# Patient Record
Sex: Male | Born: 1960 | Race: White | Hispanic: No | Smoking: Never smoker
Health system: Southern US, Community
[De-identification: ages and names within clinical notes are randomized; demographics above are authoritative.]

## PROBLEM LIST (undated history)

## (undated) DIAGNOSIS — E119 Type 2 diabetes mellitus without complications: Secondary | ICD-10-CM

## (undated) DIAGNOSIS — I10 Essential (primary) hypertension: Secondary | ICD-10-CM

## (undated) DIAGNOSIS — T675XXA Heat exhaustion, unspecified, initial encounter: Secondary | ICD-10-CM

## (undated) DIAGNOSIS — E785 Hyperlipidemia, unspecified: Secondary | ICD-10-CM

## (undated) DIAGNOSIS — C61 Malignant neoplasm of prostate: Secondary | ICD-10-CM

## (undated) HISTORY — DX: Essential (primary) hypertension: I10

## (undated) HISTORY — DX: Malignant neoplasm of prostate: C61

## (undated) HISTORY — DX: Type 2 diabetes mellitus without complications: E11.9

## (undated) HISTORY — PX: PROSTATE SURGERY: SHX751

## (undated) HISTORY — DX: Hyperlipidemia, unspecified: E78.5

## (undated) HISTORY — DX: Heat exhaustion, unspecified, initial encounter: T67.5XXA

## (undated) HISTORY — PX: BACK SURGERY: SHX140

---

## 2003-06-23 DIAGNOSIS — C61 Malignant neoplasm of prostate: Secondary | ICD-10-CM

## 2003-06-23 HISTORY — PX: PROSTATECTOMY: SHX69

## 2003-06-23 HISTORY — DX: Malignant neoplasm of prostate: C61

## 2005-02-05 ENCOUNTER — Other Ambulatory Visit: Admission: RE | Admit: 2005-02-05 | Discharge: 2005-02-05 | Payer: Self-pay | Admitting: Urology

## 2005-03-10 ENCOUNTER — Encounter (INDEPENDENT_AMBULATORY_CARE_PROVIDER_SITE_OTHER): Payer: Self-pay | Admitting: Urology

## 2005-03-10 ENCOUNTER — Inpatient Hospital Stay (HOSPITAL_COMMUNITY): Admission: RE | Admit: 2005-03-10 | Discharge: 2005-03-17 | Payer: Self-pay | Admitting: Urology

## 2005-03-23 ENCOUNTER — Inpatient Hospital Stay (HOSPITAL_COMMUNITY): Admission: AD | Admit: 2005-03-23 | Discharge: 2005-03-25 | Payer: Self-pay | Admitting: Urology

## 2005-05-07 ENCOUNTER — Encounter: Admission: RE | Admit: 2005-05-07 | Discharge: 2005-05-07 | Payer: Self-pay | Admitting: Occupational Medicine

## 2005-05-08 ENCOUNTER — Ambulatory Visit (HOSPITAL_COMMUNITY): Admission: RE | Admit: 2005-05-08 | Discharge: 2005-05-08 | Payer: Self-pay | Admitting: Urology

## 2005-05-09 ENCOUNTER — Encounter: Admission: RE | Admit: 2005-05-09 | Discharge: 2005-05-09 | Payer: Self-pay | Admitting: Occupational Medicine

## 2005-07-03 ENCOUNTER — Ambulatory Visit (HOSPITAL_COMMUNITY): Admission: RE | Admit: 2005-07-03 | Discharge: 2005-07-03 | Payer: Self-pay | Admitting: Urology

## 2005-08-13 ENCOUNTER — Ambulatory Visit (HOSPITAL_COMMUNITY): Admission: RE | Admit: 2005-08-13 | Discharge: 2005-08-13 | Payer: Self-pay | Admitting: Urology

## 2005-08-21 ENCOUNTER — Ambulatory Visit (HOSPITAL_COMMUNITY): Admission: RE | Admit: 2005-08-21 | Discharge: 2005-08-21 | Payer: Self-pay | Admitting: Urology

## 2005-08-21 ENCOUNTER — Encounter (INDEPENDENT_AMBULATORY_CARE_PROVIDER_SITE_OTHER): Payer: Self-pay | Admitting: Urology

## 2005-12-15 ENCOUNTER — Encounter (INDEPENDENT_AMBULATORY_CARE_PROVIDER_SITE_OTHER): Payer: Self-pay | Admitting: *Deleted

## 2005-12-15 ENCOUNTER — Ambulatory Visit (HOSPITAL_COMMUNITY): Admission: RE | Admit: 2005-12-15 | Discharge: 2005-12-15 | Payer: Self-pay | Admitting: Urology

## 2009-10-16 ENCOUNTER — Emergency Department (HOSPITAL_COMMUNITY): Admission: EM | Admit: 2009-10-16 | Discharge: 2009-10-16 | Payer: Self-pay | Admitting: Emergency Medicine

## 2010-07-07 ENCOUNTER — Encounter: Payer: Self-pay | Admitting: Gastroenterology

## 2010-07-16 ENCOUNTER — Encounter (INDEPENDENT_AMBULATORY_CARE_PROVIDER_SITE_OTHER): Payer: Self-pay | Admitting: *Deleted

## 2010-07-18 ENCOUNTER — Ambulatory Visit
Admission: RE | Admit: 2010-07-18 | Discharge: 2010-07-18 | Payer: Self-pay | Source: Home / Self Care | Attending: Gastroenterology | Admitting: Gastroenterology

## 2010-07-24 NOTE — Miscellaneous (Signed)
Summary: previsit prep/rm  Clinical Lists Changes  Medications: Added new medication of MOVIPREP 100 GM  SOLR (PEG-KCL-NACL-NASULF-NA ASC-C) As per prep instructions. - Signed Rx of MOVIPREP 100 GM  SOLR (PEG-KCL-NACL-NASULF-NA ASC-C) As per prep instructions.;  #1 x 0;  Signed;  Entered by: Sherren Kerns RN;  Authorized by: Louis Meckel MD;  Method used: Electronically to [facility] [phone]*, [address], [place], [place], [place]  [address], Ph: [phone] or [phone], Fax: [phone] Allergies: Added new allergy or adverse reaction of * BLOOD PRESSURE PILL Observations: Added new observation of ALLERGY REV: Done (07/18/2010 13:00) Added new observation of NKA: F (07/18/2010 13:00)    Prescriptions: MOVIPREP 100 GM  SOLR (PEG-KCL-NACL-NASULF-NA ASC-C) As per prep instructions.  #1 x 0   Entered by:   Sherren Kerns RN   Authorized by:   Louis Meckel MD   Signed by:   Sherren Kerns RN on 07/18/2010   Method used:   Electronically to        [facility] [phone]* (retail)       [address]       [place], [place]  [address]       Ph: [phone] or [phone]       Fax: [phone]   RxID:   [id number]

## 2010-07-24 NOTE — Letter (Signed)
Summary: Pre Visit Letter Revised  [facility]  [address] [place], [place] [address]   Phone: [phone]  Fax: [phone]        07/07/2010 MRN: [id number]  [facility] [place] [address] [place], [place]  [address]             Procedure Date:  07-31-10 10:30am           Direct Colon - Dr Nedra Hai to the Gastroenterology Division at [facility].    You are scheduled to see a nurse for your pre-procedure visit on 07-18-10 at 1:30pm on the 3rd floor at [facility], [address]  We ask that you try to arrive at our office 15 minutes prior to your appointment time to allow for check-in.  Please take a minute to review the attached form.  If you answer "Yes" to one or more of the questions on the first page, we ask that you call the person listed at your earliest opportunity.  If you answer "No" to all of the questions, please complete the rest of the form and bring it to your appointment.    Your nurse visit will consist of discussing your medical and surgical history, your immediate family medical history, and your medications.   If you are unable to list all of your medications on the form, please bring the medication bottles to your appointment and we will list them.  We will need to be aware of both prescribed and over the counter drugs.  We will need to know exact dosage information as well.    Please be prepared to read and sign documents such as consent forms, a financial agreement, and acknowledgement forms.  If necessary, and with your consent, a friend or relative is welcome to sit-in on the nurse visit with you.  Please bring your insurance card so that we may make a copy of it.  If your insurance requires a referral to see a specialist, please bring your referral form from your primary care physician.  No co-pay is required for this nurse visit.     If you cannot keep your appointment, please call [phone] to cancel or reschedule prior to  your appointment date.  This allows [place] the opportunity to schedule an appointment for another patient in need of care.    Thank you for choosing [facility] for your medical needs.  We appreciate the opportunity to care for you.  Please visit [place] at our website  to learn more about our practice.  Sincerely, The Gastroenterology Division

## 2010-07-24 NOTE — Letter (Signed)
Summary: [facility] Instructions  [facility]  [address] [place], [place] [address]   Phone: [phone]  Fax: [phone]       Anthony Austin    04-Jul-1960    MRN: [id number]        Procedure Day /Date: Thursday 07/31/10     Arrival Time: 9:30 am      Procedure Time: 10:30 am     Location of Procedure:                    _X_  [facility] (4th Floor)   PREPARATION FOR COLONOSCOPY WITH MOVIPREP   Starting 5 days prior to your procedure _Saturday 07/26/10 _ do not eat nuts, seeds, popcorn, corn, beans, peas,  salads, or any raw vegetables.  Do not take any fiber supplements (e.g. Metamucil, Citrucel, and Benefiber).  THE DAY BEFORE YOUR PROCEDURE         DATE:  07/30/10    DAY: Wednesday    1.  Drink clear liquids the entire day-NO SOLID FOOD  2.  Do not drink anything colored red or purple.  Avoid juices with pulp.  No orange juice.  3.  Drink at least 64 oz. (8 glasses) of fluid/clear liquids during the day to prevent dehydration and help the prep work efficiently.  CLEAR LIQUIDS INCLUDE: Water Jello Ice Popsicles Tea (sugar ok, no milk/cream) Powdered fruit flavored drinks Coffee (sugar ok, no milk/cream) Gatorade Juice: apple, white grape, white cranberry  Lemonade Clear bullion, consomm, broth Carbonated beverages (any kind) Strained chicken noodle soup Hard Candy                             4.  In the morning, mix first dose of MoviPrep solution:    Empty 1 Pouch A and 1 Pouch B into the disposable container    Add lukewarm drinking water to the top line of the container. Mix to dissolve    Refrigerate (mixed solution should be used within 24 hrs)  5.  Begin drinking the prep at 5:00 p.m. The MoviPrep container is divided by 4 marks.   Every 15 minutes drink the solution down to the next mark (approximately 8 oz) until the full liter is complete.   6.  Follow completed prep with 16 oz of clear liquid of your choice (Nothing red or  purple).  Continue to drink clear liquids until bedtime.  7.  Before going to bed, mix second dose of MoviPrep solution:    Empty 1 Pouch A and 1 Pouch B into the disposable container    Add lukewarm drinking water to the top line of the container. Mix to dissolve    Refrigerate  THE DAY OF YOUR PROCEDURE      DATE: 07/31/10   DAY: Thursday   Beginning at 5:30 a.m. (5 hours before procedure):         1. Every 15 minutes, drink the solution down to the next mark (approx 8 oz) until the full liter is complete.  2. Follow completed prep with 16 oz. of clear liquid of your choice.    3. You may drink clear liquids until 8:30 am (2 HOURS BEFORE PROCEDURE).   MEDICATION INSTRUCTIONS  Unless otherwise instructed, you should take regular prescription medications with a small sip of water   as early as possible the morning of your procedure.   Additional medication instructions: Hold HCTZ pill morning  of procedure only! Should take Diltiazem BP  Pill morning of procedure!         OTHER INSTRUCTIONS  You will need a responsible adult at least 50 years of age to accompany you and drive you home.   This person must remain in the waiting room during your procedure.  Wear loose fitting clothing that is easily removed.  Leave jewelry and other valuables at home.  However, you may wish to bring a book to read or  an iPod/MP3 player to listen to music as you wait for your procedure to start.  Remove all body piercing jewelry and leave at home.  Total time from sign-in until discharge is approximately 2-3 hours.  You should go home directly after your procedure and rest.  You can resume normal activities the  day after your procedure.  The day of your procedure you should not:   Drive   Make legal decisions   Operate machinery   Drink alcohol   Return to work  You will receive specific instructions about eating, activities and medications before you leave.    The above  instructions have been reviewed and explained to me by   Sherren Kerns RN  July 18, 2010 2:01 PM    I fully understand and can verbalize these instructions _____________________________ Date _________

## 2010-07-31 ENCOUNTER — Encounter: Payer: Self-pay | Admitting: Gastroenterology

## 2010-07-31 ENCOUNTER — Other Ambulatory Visit (AMBULATORY_SURGERY_CENTER): Payer: Managed Care, Other (non HMO) | Admitting: Gastroenterology

## 2010-07-31 DIAGNOSIS — K5730 Diverticulosis of large intestine without perforation or abscess without bleeding: Secondary | ICD-10-CM

## 2010-07-31 DIAGNOSIS — Z1211 Encounter for screening for malignant neoplasm of colon: Secondary | ICD-10-CM

## 2010-07-31 HISTORY — PX: COLONOSCOPY: SHX174

## 2010-08-07 NOTE — Procedures (Signed)
Summary: Colonoscopy   Colonoscopy  Procedure date:  07/31/2010  Findings:      Location:  [facility].    Procedures Next Due Date:    Colonoscopy: 07/2020 COLONOSCOPY PROCEDURE REPORT  PATIENT:  Anthony Austin, Anthony Austin  MR#:  [id number] BIRTHDATE:   11-26-1960, 50 yrs. old   GENDER:   male  ENDOSCOPIST:   Barbette Hair. Arlyce Dice, MD Referred by: Rudi Heap, M.D.  PROCEDURE DATE:  07/31/2010 PROCEDURE:  Diagnostic Colonoscopy ASA CLASS:   Class II INDICATIONS: 1) Routine Risk Screening   MEDICATIONS:    Fentanyl 50 mcg IV, Versed 7 mg IV  DESCRIPTION OF PROCEDURE:   After the risks benefits and alternatives of the procedure were thoroughly explained, informed consent was obtained.  Digital rectal exam was performed and revealed no abnormalities.   The LB 180AL [id number] endoscope was introduced through the anus and advanced to the cecum, which was identified by both the appendix and ileocecal valve, without limitations.  The quality of the prep was excellent, using MoviPrep.  The instrument was then slowly withdrawn as the colon was fully examined. <<PROCEDUREIMAGES>>                  <<OLD IMAGES>>  FINDINGS:  Mild diverticulosis was found in the sigmoid colon (see image17).  This was otherwise a normal examination of the colon (see image2, image4, image5, image8, image10, image15, image16, image19, and image20).   Retroflexed views in the rectum revealed no abnormalities.    The time to cecum =  2.25  minutes. The scope was then withdrawn (time =  6.0  min) from the patient and the procedure completed.  COMPLICATIONS:   None  ENDOSCOPIC IMPRESSION:  1) Mild diverticulosis in the sigmoid colon  2) Otherwise normal examination RECOMMENDATIONS:  1) Continue current colorectal screening recommendations for "routine risk" patients with a repeat colonoscopy in 10 years.  REPEAT EXAM:    10 year(s) Colonoscopy   _______________________________ Barbette Hair. Arlyce Dice,  MD    CC:

## 2010-09-09 LAB — BASIC METABOLIC PANEL
BUN: 15 mg/dL (ref 6–23)
CO2: 28 mEq/L (ref 19–32)
Calcium: 9.2 mg/dL (ref 8.4–10.5)
Chloride: 101 mEq/L (ref 96–112)
Creatinine, Ser: 1.01 mg/dL (ref 0.4–1.5)
GFR calc Af Amer: >60 mL/min (ref >60)
GFR calc non Af Amer: >60 mL/min (ref >60)
Glucose, Bld: 124 mg/dL — ABNORMAL HIGH (ref 70–99)
Potassium: 3.4 mEq/L — ABNORMAL LOW (ref 3.5–5.1)
Sodium: 137 mEq/L (ref 135–145)

## 2010-09-09 LAB — CBC
HCT: 45.6 % (ref 39.0–52.0)
Hemoglobin: 15.8 g/dL (ref 13.0–17.0)
MCHC: 34.7 g/dL (ref 30.0–36.0)
MCV: 94.4 fL (ref 78.0–100.0)
Platelets: 216 10*3/uL (ref 150–400)
RBC: 4.84 MIL/uL (ref 4.22–5.81)
RDW: 12.9 % (ref 11.5–15.5)
WBC: 6.5 10*3/uL (ref 4.0–10.5)

## 2010-09-09 LAB — POCT CARDIAC MARKERS
CKMB, poc: 1.9 ng/mL (ref 1.0–8.0)
Myoglobin, poc: 54.7 ng/mL (ref 12–200)
Troponin i, poc: <0.05 ng/mL (ref 0.00–0.09)

## 2010-09-09 LAB — DIFFERENTIAL
Basophils Absolute: 0 10*3/uL (ref 0.0–0.1)
Basophils Relative: 0 % (ref 0–1)
Eosinophils Absolute: 0.1 10*3/uL (ref 0.0–0.7)
Eosinophils Relative: 2 % (ref 0–5)
Lymphocytes Relative: 31 % (ref 12–46)
Lymphs Abs: 2 10*3/uL (ref 0.7–4.0)
Monocytes Absolute: 0.4 10*3/uL (ref 0.1–1.0)
Monocytes Relative: 7 % (ref 3–12)
Neutro Abs: 3.9 10*3/uL (ref 1.7–7.7)
Neutrophils Relative %: 61 % (ref 43–77)

## 2010-11-07 NOTE — H&P (Signed)
NAME:  Anthony Austin, Anthony Austin                  ACCOUNT NO.:  [id number]   MEDICAL RECORD NO.:  [id number]           PATIENT TYPE:  AMB   LOCATION:                                FACILITY:  [facility]   PHYSICIAN:  Ky Barban, M.D.DATE OF BIRTH:  1960/08/30   DATE OF ADMISSION:  DATE OF DISCHARGE:  LH                                HISTORY & PHYSICAL   CHIEF COMPLAINT:  Bladder neck stenosis.  A 50 year old gentleman had  radical retropubic prostatectomy done September last year and his PSA is  less than 0.1 but he has developed difficulty voiding and a few months ago  he had a bladder neck biopsy and dilation done.  There is no recurrence of  the cancer and he did well after dilating the bladder neck.  Now he has  started to have the same problem again.  He needs to have dilation which he  requests under anesthesia.  He will come as outpatient and we will do  bladder neck dilation under anesthesia as outpatient.   PAST MEDICAL HISTORY:  As mentioned above.  Otherwise, he is in good general  medical condition.   FAMILY HISTORY:  Father has prostate cancer.   PERSONAL HISTORY:  Does not smoke or drink.   REVIEW OF SYSTEMS:  Unremarkable.   PHYSICAL EXAMINATION:  VITAL SIGNS:  Blood pressure 136/84, temperature  97.3.  GENERAL:  Fully conscious, alert, oriented, not in any acute distress.  CENTRAL NERVOUS SYSTEM:  Negative.  HEENT:  Negative.  CHEST:  Symmetrical.  HEART:  Regular sinus rhythm.  ABDOMEN:  Soft, flat.  Liver, spleen, kidneys are not palpable.  No CVA  tenderness.  GENITOURINARY:  External genitalia is unremarkable.  RECTAL:  Deferred.  EXTREMITIES:  Normal.   IMPRESSION:  Bladder neck stenosis, carcinoma prostate.   PLAN:  Cysto dilation under anesthesia as outpatient.      Ky Barban, M.D.  Electronically Signed    MIJ/MEDQ  D:  12/14/2005  T:  12/14/2005  Job:  [id number]

## 2010-11-07 NOTE — H&P (Signed)
NAMEHEATHER, STREEPER                  ACCOUNT NO.:  [id number]   MEDICAL RECORD NO.:  [id number]          PATIENT TYPE:  INP   LOCATION:  A312                          FACILITY:  [facility]   PHYSICIAN:  Ky Barban, M.D.DATE OF BIRTH:  08/06/1960   DATE OF ADMISSION:  03/23/2005  DATE OF DISCHARGE:  LH                                HISTORY & PHYSICAL   CHIEF COMPLAINT:  Foley catheter stuck.   HISTORY:  A 50 year old gentleman two weeks ago had a radical retropubic  prostatectomy for carcinoma of the prostate. Last week, he was sent home.  Today, he comes in complaining of ______________ leakage around the Foley  catheter. He is about two weeks postoperative. Instead of waiting three  weeks, I decided to take out the Foley catheter. The balloon was deflated,  but the Foley catheter is not coming out. It may be it is stuck inside with  a stitch or something, and he is a lot discomfort, so I decided to admit  him, and I will do cystoscopy tomorrow under anesthesia to see if we can get  the catheter out.   PAST MEDICAL HISTORY:  Refer to his old record.   FAMILY HISTORY:  Refer to his old record.   REVIEW OF SYSTEMS:  No other significant medical problems.   PHYSICAL EXAMINATION:  VITAL SIGNS:  Blood pressure 130/80, temperature is  normal.  CENTRAL NERVOUS SYSTEM:  No gross neurological deficit.  HEENT:  Negative.  CHEST:  Symmetrical.  HEART:  Regular sinus rhythm.  ABDOMEN:  Soft, flat. Liver, spleen, kidneys not palpable. No CVA  tenderness. Abdomen is soft.  GENITOURINARY:  External genitalia:  Has Foley catheter in place. Complains  of suprapubic discomfort. Testicles are normal.  RECTAL:  Deferred.   IMPRESSION:  Status post radical retropubic prostatectomy. Foley catheter  stuck.   PLAN:  Cysto, removal of Foley under anesthesia.      Ky Barban, M.D.  Electronically Signed    MIJ/MEDQ  D:  03/23/2005  T:  03/23/2005  Job:  [id number]

## 2010-11-07 NOTE — Op Note (Signed)
NAME:  Anthony Austin, Anthony Austin                  ACCOUNT NO.:  [id number]   MEDICAL RECORD NO.:  [id number]          PATIENT TYPE:  AMB   LOCATION:  DAY                           FACILITY:  [facility]   PHYSICIAN:  Ky Barban, M.D.DATE OF BIRTH:  Nov 19, 1960   DATE OF PROCEDURE:  12/15/2005  DATE OF DISCHARGE:                                 OPERATIVE REPORT   PREOPERATIVE DIAGNOSES:  1.  Bladder neck stenosis, post radical retropubic prostatectomy.  2.  Carcinoma of prostate.   POSTOPERATIVE DIAGNOSES:  1.  Bladder neck stenosis, post radical retropubic prostatectomy.  2.  Carcinoma of prostate.   PROCEDURE:  Cystoscopy, dilation of bladder neck, laser ablation of bladder  neck, bladder neck biopsy.   ANESTHESIA:  General endotracheal.   PROCEDURE:  The patient was given general endotracheal anesthesia and placed  in the lithotomy position.  Usual prep and drape.  A #17 cystoscope was  introduced under direct vision, and the anterior urethra looks normal.  The  bladder neck is very much stenosed.  I was able to get a #4 whistle-tip  catheter through it.  Once I got the catheter through it, then I introduced  a 550 laser fiber and opened up the bladder neck with the Holmium laser.  Then, using a side-firing fiber, the bladder neck was ablated, and a wide  open bladder neck was obtained, and at this point I introduced the  cystoscope into the bladder.  It was inspected.  Looks normal.  The  cystoscope was removed, and with the flexible biopsy forceps one area of the  bladder neck at 9 o'clock posthion was biopsied. Then, a #24 Foley catheter  was left in for drainage.  The patient left the operating room in  satisfactory condition.      Ky Barban, M.D.  Electronically Signed     MIJ/MEDQ  D:  12/15/2005  T:  12/15/2005  Job:  [id number]

## 2010-11-07 NOTE — Op Note (Signed)
Anthony Austin, Anthony Austin                  ACCOUNT NO.:  [id number]   MEDICAL RECORD NO.:  [id number]          PATIENT TYPE:  INP   LOCATION:  A207                          FACILITY:  [facility]   PHYSICIAN:  Ky Barban, M.D.DATE OF BIRTH:  1960-08-01   DATE OF PROCEDURE:  03/10/2005  DATE OF DISCHARGE:  03/17/2005                                 OPERATIVE REPORT   PREOPERATIVE DIAGNOSIS:  Carcinoma of prostate.   POSTOPERATIVE DIAGNOSIS:  Carcinoma of prostate.   PROCEDURE:  Bilateral pelvic node dissection, frozen section, radical  retropubic prostatectomy.   SURGEON:  Ky Barban, M.D.   ASSISTANT:  ____________, R.N.   ANESTHESIA:  General endotracheal.   ESTIMATED BLOOD LOSS:  700 cc. Replacement none.   COMPLICATIONS:  None. Instrument, needle, sponge count correct.   PROCEDURE:  The patient was given general endotracheal anesthesia, placed in  spine position, usual prep and drape. Suprapubic midline incision was made  about 2-1/2 inches long. We have already inserted #20 Foley in his bladder.  The incision is carried down through his skin. The rectus sheath was  incised. Recti separated in the midline, and the anterior vesical space is  entered. With blunt dissection, the retropubic space is exposed, and right  and left external iliac vessels are exposed. At this point, self-retaining  internal ___________ with resector is applied. On palpation, no gross  enlargement of the lymph nodes is palpable in the groin along side the  vessels. Although the palpation of the prostate feels normal. I proceeded to  do node dissection on the right side first. The fascia covering the right  external iliac vein was opened. The obturator nerve and vessels were  identified. With blunt and sharp dissection, ligating the lymphatics with  clips, the tissue was picked off the ___________ fascia, and these lymph  nodes were gathered with the help of an Allis clamp, and with blunt and  sharp dissection, they were removed to the level of the bifurcation of the  right common iliac vein. The obturator vessels and nerves were skeletonized  very carefully. Once the specimen was removed, it was sent for frozen  section. This area was left packed, and then I proceeded to do the  dissection of the left lymph nodes which were done in the exact same way.  Specimen was sent for frozen section. The report from both sides came back  no evidence of any metastatic carcinoma, so I proceeded with radical  retropubic prostatectomy. Very carefully, incision is made near the apex of  the prostate in the pelvic fascia, going along side the prostate on both  sides. The fascia was lifted off the prostate gently and pushed away all of  the neurovascular bundles on both sides. The dorsal vein complex was closed  with a couple of stitches of 0 Vicryl stitch blindly ligating them. One  stitch was placed at the level of the apex of the prostate. Holding the  dorsal vein complex stitch and the post-apical stitch, the dorsal vein  complex is divided. On the inside of the dorsal vein complex,  there were  puboprostatic ligaments. They were very carefully divided with the help of  the scissor. The dorsal vein complex was divided to the level of the  urethra. Once the urethra was exposed, a large right-angle tip was passed  behind the urethra, and it was lifted up, and under direct vision, the  anterior Housley of the urethra was divided exposing the Foley catheter. The  anterior Wieland of the urethra was stitched to the dorsal vein complex with a  continuous stitch of 3-0 Vicryl, and this stitch on both ends was left  intact so that it can be closed tighter later on. Now, the Foley was grabbed  with the help of long hemostats and divided. A hemostat was placed on the  proximal end of the Foley catheter, so the balloon will stay inflated. Now,  under direct vision, the posterior Beggs of the urethra was divided.  Once the  urethra was divided, the Foley catheter cut end came out from the urethra.  Slight traction on the intact Foley catheter lifted up the apex of the  prostate, and I pushed the neural vesical bundles away from the prostate on  both sides along with the rectum. This way, I exposed the posterior surface  of the prostate. The Denonvilliers fascia was divided, and plane was  developed between the seminal vesicles and the Denonvilliers fascia. The  ____________ were identified on both sides, and they were clipped and  divided. Seminal vesicles with sharp and blunt dissection were pulled out of  its sheath, and at the end of the seminal vesicle, the seminal vesical  artery was clipped on both sides. Once the seminal vesicles were out, I  proceeded to do dissection on the level of the bladder neck anteriorly.  Bladder neck was opened up. The bladder was opened. Ureteral orifices were  identified, and we left #5 vessel tip catheters on both sides and stabilized  the bladder with 4-0 chromic stitch. Posterior bladder neck was divided, and  the remainder part of the seminal vesical sheath was divided on this side.  During the process, the inferior and superior pedicle of the prostate, they  were ligated and divided. Specimen was removed. The operative site was  inspected for bleeding. It was irrigated with saline. The bleeders were  simply cauterized and ___________ anastomosis. The bladder neck is first  fashioned to the level of my index finger and by closing on the side of the  bladder neck with 2-0 Vicryl stitch. Then, epithelizing the bladder neck  with continuous stitch of 4-0 chromic avoiding the bladder mucosa, covering  the bladder neck. Now, we were ready to do the anastomosis. A 20 Foley  catheter with 30 cc balloon is introduced in the urethra, and at the pelvic  end of the catheter, 0 Vicryl tie is passed through the catheter so that it can be pulled in and out of the ureteral  stem. Six stitches were placed  within the bladder neck and the urethra. Once the six stitches were in  place, the Foley catheter was positioned in the bladder, and the balloon is  inflated. The retractors and bladder blade is removed, and the bladder is  pushed into the retropubic space to approximate the bladder neck with the  urethral stump with traction on the Foley balloon which has been inflated to  30 cc. The urethral stump is approximated to the bladder neck. All of the  stitches are tied 1 x 1 in the sequence they  were placed. Some of these  anterior stitches were tied with the dorsal vein complex and the ureteral  stump stitch on each side. The bladder is irrigated. The retropubic space  was drained with a __________ sump which came out through a separate stab  wound on the right side, stabilized to the skin with a 0 silk stitch.  Ureteral catheters also came out from the bladder through the same exit in  the skin. Closed the incision. The patient lost about 700 cc of blood and  did not require any replacement. He is doing fine. Rectus sheath is closed  with continuous stitch of 0 Vicryl. Skin is closed with staples. Sterile  gauze dressing applied. The patient left the operating room in satisfactory  condition.      Ky Barban, M.D.  Electronically Signed     MIJ/MEDQ  D:  04/10/2005  T:  04/10/2005  Job:  [id number]

## 2010-11-07 NOTE — Discharge Summary (Signed)
NAMEGIANNI, MIHALIK                  ACCOUNT NO.:  [id number]   MEDICAL RECORD NO.:  [id number]          PATIENT TYPE:  INP   LOCATION:  A312                          FACILITY:  [facility]   PHYSICIAN:  Ky Barban, M.D.DATE OF BIRTH:  December 02, 1960   DATE OF ADMISSION:  03/23/2005  DATE OF DISCHARGE:  10/04/2006LH                                 DISCHARGE SUMMARY   HISTORY OF PRESENT ILLNESS:  A 50 year old gentleman who recently underwent  radical retrograde prostatectomy for carcinoma of the prostate.  He has a  Foley catheter which we tried to take out.  It is not coming out.  He is  having a lot of bladder spells, so I decided to bring him in the hospital  because he was having considerable pain, and then I decided that I needed to  cystoscope him under anesthesia.  He was admitted.  A routine admission  workup was done.  WBC was 8.7, hematocrit 32.5, sodium 139, potassium 4.3,  chloride 103, CO2 32, glucose 111, BUN 9, creatinine 1.1.  Urinalysis showed  it is cloudy, subsequently came back positive at E. coli 100,000 colonies.  The next day, I took him to the operating room on October 3.  Cystoscopy was  performed with __________ ureter strobe alongside the Foley catheter.  I  went into the bladder and at the bladder neck, I can see that Foley catheter  cord and one of the bladder neck stitches.  So, I simply divided that with  the help of the cautery.  Once that stitch was removed, the Foley catheter  came out.  I did leave a Foley catheter #18, so that if there is any  disruption of the anastomosis, it should help.  He had surgery done only two  weeks ago, so I decided to leave the Foley catheter in for another one week.  Postoperatively, he is doing fine.  I am going to discharge him home with  Foley catheter.  I gave him Bactrim DS one b.i.d. x7 days.  I will see him  back next week in the office to remove his Foley catheter.   FINAL DISCHARGE DIAGNOSES:  Retain Foley catheter  __________ retropubic  prostatectomy.   CONDITION ON DISCHARGE:  Improved.   DISCHARGE MEDICATIONS:  Bactrim DS one b.i.d. #12.      Ky Barban, M.D.  Electronically Signed     MIJ/MEDQ  D:  04/10/2005  T:  04/10/2005  Job:  [id number]

## 2010-11-07 NOTE — H&P (Signed)
NAME:  Anthony Austin, Anthony Austin                  ACCOUNT NO.:  [id number]   MEDICAL RECORD NO.:  [id number]            PATIENT TYPE:   LOCATION:                                FACILITY:  [facility]   PHYSICIAN:  Ky Barban, M.D.DATE OF BIRTH:  Mar 24, 1961   DATE OF ADMISSION:  03/10/2005  DATE OF DISCHARGE:  LH                                HISTORY & PHYSICAL   CONTINUATION   PHYSICAL EXAMINATION:  VITAL SIGNS:  Blood pressure 156/99, temperature 98.2  NEUROLOGIC:  No gross neurological deficits.  HEAD AND NECK: Normal.  No lymphadenopathy.  No thyromegaly.  CHEST: Symmetrical.  Normal breath sounds.  HEART:  Regular sinus rhythm.  ABDOMEN:  Soft, nondistended.  No masses palpable.  Liver, spleen, kidneys  are not palpated.  EXTERNAL GENITALIA:  Circumcised, meatus adequate.  Testicles are normal.  RECTAL:  Sphincter tone is normal.  No rectal mass.  Prostate ________+,  smooth, and firm.  EXTREMITIES: Normal.   IMPRESSION:  Carcinoma of the prostate.   PLAN:  Radical retropubic prostatectomy and bilateral pelvic node  dissection.      Ky Barban, M.D.  Electronically Signed     MIJ/MEDQ  D:  03/09/2005  T:  03/09/2005  Job:  [id number]

## 2010-11-07 NOTE — H&P (Signed)
NAME:  Lacher, Ajani                  ACCOUNT NO.:  [id number]   MEDICAL RECORD NO.:  [id number]          PATIENT TYPE:  AMB   LOCATION:  DAY                           FACILITY:  [facility]   PHYSICIAN:  Ky Barban, M.D.DATE OF BIRTH:  1960/10/01   DATE OF ADMISSION:  DATE OF DISCHARGE:  LH                                HISTORY & PHYSICAL   This patient is coming this morning as outpatient in [facility] to have a cystoscopy done as outpatient under anesthesia.   CHIEF COMPLAINT:  Dysuria and discomfort in the perineum.   HISTORY:  A 50 year old male about three months ago underwent radical  retropubic prostatectomy for carcinoma of the prostate. Postoperatively, he  did fine after taking the catheter out. About six weeks later, he started to  have difficulty to void. 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 He did fine  after the procedure. Then, he started to complain of severe pain in the  perineum and also dysuria. He says urinary stream most of the time is fine.  Denies any fevers, chills or gross hematuria. His followup yesterday was  0.1.   Dictation ended at this point.      Ky Barban, M.D.  Electronically Signed     MIJ/MEDQ  D:  08/21/2005  T:  08/21/2005  Job:  [id number]

## 2010-11-07 NOTE — Op Note (Signed)
[name], Anthony Austin                  ACCOUNT NO.:  [id number]   MEDICAL RECORD NO.:  [id number]          PATIENT TYPE:  INP   LOCATION:  A312                          FACILITY:  [facility]   PHYSICIAN:  Ky Barban, M.D.DATE OF BIRTH:  09-13-1960   DATE OF PROCEDURE:  03/24/2005  DATE OF DISCHARGE:                                 OPERATIVE REPORT   PREOPERATIVE DIAGNOSIS:  Retained Foley catheter.   POSTOPERATIVE DIAGNOSIS:  Retained Foley catheter with bladder neck stitch.   PROCEDURE:  Cysto, removal of Foley catheter.   ANESTHESIA:  General.   INDICATIONS:  This patient two weeks ago had radical retropubic  prostatectomy. He was having a lot of bladder spasm, so after two weeks, we  decided to take the Foley catheter out. It will not come out. It was stuck  in the bladder. So, I brought him in to take the Foley out. Usually I leave  the catheter for three weeks. He has Vicryl stitches in anastomosis.  Usually, they will dissolve in five to six weeks. I have given him the  option to wait and see if comes out, but he wanted to get the catheter out,  so I decided to do cystoscopy.   He was given general endotracheal anesthesia, placed in lithotomy position,  usual prep and drape. The Foley catheter was cut near the meatus, leaving a  0 silk stitch attached to it, and I introduced short rigid urethroscope  along side the catheter, went into the bladder. I can see at 12 o'clock  position there is a stitch going around the Foley catheter. It is not going  through the catheter. It is around the Foley catheter, and it is holding it,  and there is a lot of granulation tissue at that point. So I decided to cut  the stitch, so I pushed the catheter into the bladder, holding on the silk  tie outside, and a #20 Iglesias resectoscope was introduced into the  bladder, and the stitch was cut with the cautery, and the Foley catheter  ___________ came out. All of the instruments were removed. I  decided to  leave #18 Foley catheter in. It was irrigated and irrigates fine. All of the  instruments were removed, and the patient left the operating room in  satisfactory condition.      Ky Barban, M.D.  Electronically Signed     MIJ/MEDQ  D:  03/24/2005  T:  03/24/2005  Job:  [id number]

## 2010-11-07 NOTE — Op Note (Signed)
NAME:  Dunnaway, Fain                  ACCOUNT NO.:  [id number]   MEDICAL RECORD NO.:  [id number]           PATIENT TYPE:  AMB   LOCATION:                                FACILITY:  [facility]   PHYSICIAN:  Ky Barban, M.D.DATE OF BIRTH:  [birth year]   DATE OF PROCEDURE:  08/21/2005  DATE OF DISCHARGE:                                 OPERATIVE REPORT   PREOPERATIVE DIAGNOSES:  1.  Carcinoma of the prostate.  2.  Postradical retropubic prostatectomy.  3.  Bladder neck stenosis.   POSTOPERATIVE DIAGNOSES:  1.  Carcinoma of the prostate.  2.  Postradical retropubic prostatectomy.  3.  Bladder neck stenosis.   PROCEDURE:  Transurethral resection bladder neck.   ANESTHESIA:  General.   SURGEON:  Ky Barban, M.D.   DESCRIPTION OF PROCEDURE:  The patient was given general anesthesia, placed  in the lithotomy position after sterile prep and drape.  A #22 cystoscope  introduced into the urethra under direct vision. The anterior urethra looks  normal.  The area of the anastomosis of the bladder neck to the urethra is  quite inflamed, covered with some necrotic whitish tissue.  I could not  advance my scope, #22 through it, because the bladder neck is tight,  although it is not very tight beyond, it is probably 20-French in size.  So,  I removed that and dilated the bladder neck with Sissy Hoff sounds to 28  [place].  Then, the cystoscope was introduced and bladder was inspected. The  bladder looks perfectly normal.  The ureteral orifices look normal. The only  thing that I see is the bladder neck at the anastomotic site. There is a  thick scar tissue and there is a band of tissue across the bladder neck  above the 6 o'clock position.  Also there is inflammation in the area of the  anastomosis, but I do not see any gross tumor in this area.  So, I used a  #28 Iglesias resectoscope and collected the bridge of tissue across the  bladder neck and fulgurated the inflamed area, and  inspected the bladder.  It, again, looks find.  The resectoscope was removed and I left a #18 Foley  catheter in for drainage.   Pelvic exam is done transrectally and I do not feel any mass in the area of  the prostatic fossa. All the instruments were removed. The patient left the  operating room in satisfactory condition.      Ky Barban, M.D.  Electronically Signed     MIJ/MEDQ  D:  08/21/2005  T:  08/21/2005  Job:  [id number]

## 2010-11-07 NOTE — H&P (Signed)
NAME:  Anthony Austin, Anthony Austin                  ACCOUNT NO.:  [id number]   MEDICAL RECORD NO.:  [id number]          PATIENT TYPE:  AMB   LOCATION:  DAY                           FACILITY:  [facility]   PHYSICIAN:  Ky Barban, M.D.DATE OF BIRTH:  08/12/1960   DATE OF ADMISSION:  07/02/2005  DATE OF DISCHARGE:  LH                                HISTORY & PHYSICAL   CHIEF COMPLAINT:  Bladder neck stenosis.   HISTORY OF PRESENT ILLNESS:  This is a 50 year old gentleman who had radical  prostatectomy done for carcinoma of prostate in September 2006.  Lately, he  has been doing fine except he has the difficulty voiding.  He had similar  problems a few months ago and I had to go in and dilate him under  anesthesia.  He has developed bladder neck stenosis.  He has developed the  same problem again.  I have tried to dilate him in the office where it was  painful, so we brought him in as outpatient in the hospital.  I will do  dilation of his bladder neck and anesthesia.  I may have to incise the  bladder neck with cold knife.  No fever, chills or gross hematuria.  He  almost had complete control on his incontinence, but I told him after  dilation, he may start leaking again.   PAST MEDICAL HISTORY:  As per old record.   REVIEW OF SYSTEMS:  Unremarkable.   PHYSICAL EXAMINATION:  VITAL SIGNS:  Blood pressure 160/100, temperature  normal.  NEUROLOGIC:  Negative.  HEENT:  Negative.  CHEST:  Symmetrical.  ABDOMEN:  Soft and flat.  Liver, spleen and kidneys nonpalpable.  GENITALIA:  External genitalia circumcised.  Meatus good.  Testicles normal.  RECTAL:  Deferred.  EXTREMITIES:  Normal.   IMPRESSION:  Bladder neck stenosis, status post radical retropubic  prostatectomy for carcinoma of prostate.   PLAN:  Bladder dilation of bladder neck under anesthesia as outpatient.      Ky Barban, M.D.  Electronically Signed     MIJ/MEDQ  D:  07/02/2005  T:  07/02/2005  Job:  [id number]

## 2010-11-07 NOTE — Op Note (Signed)
NAME:  Anthony Austin, Anthony Austin                  ACCOUNT NO.:  [id number]   MEDICAL RECORD NO.:  [id number]          PATIENT TYPE:  AMB   LOCATION:  DAY                           FACILITY:  [facility]   PHYSICIAN:  Ky Barban, M.D.DATE OF BIRTH:  1961/05/24   DATE OF PROCEDURE:  05/08/2005  DATE OF DISCHARGE:                                 OPERATIVE REPORT   PREOPERATIVE DIAGNOSIS:  Carcinoma of prostate status post radical  retropubic prostatectomy, bladder neck stenosis.   POSTOPERATIVE DIAGNOSIS:  Bladder neck stenosis.   PROCEDURE:  Cysto dilation.   ANESTHESIA:  General.   PROCEDURE NOTE:  The patient was given general endotracheal anesthesia,  placed in lithotomy position after usual prep and drape. A #25 cystoscope  introduced into the bladder, and the urethra looks normal. Bladder neck is  very tight. I passed a vessel tip catheter through it and then advanced the  cystoscope it, and it went into the bladder without any difficulty. The  bladder was inspected with ____________ right angle lenses, and I do not see  any pathology in the bladder. It looks completely normal. The area of the  bladder neck was inspected. Looks like there is a lot of scar tissue. But I  do not see any evidence of any recurrence of the tumor. Cystoscope was  removed, and further dilation to 16 [place] was achieved by [employer] sounds  and left #18 Foley catheter in. The patient left the operating room in  satisfactory condition. I did a digital rectal exam. The area of the  prostatic fossa appears normal. All of the instruments were removed. The  patient left the operating room in satisfactory condition.      Ky Barban, M.D.  Electronically Signed     MIJ/MEDQ  D:  05/08/2005  T:  05/08/2005  Job:  [id number]

## 2010-11-07 NOTE — Op Note (Signed)
NAME:  Belfield, Ketrick                  ACCOUNT NO.:  [id number]   MEDICAL RECORD NO.:  [id number]           PATIENT TYPE:  AMB   LOCATION:                                FACILITY:  [facility]   PHYSICIAN:  Ky Barban, M.D.DATE OF BIRTH:  Mar 12, 1961   DATE OF PROCEDURE:  07/03/2005  DATE OF DISCHARGE:                                 OPERATIVE REPORT   PREOPERATIVE DIAGNOSIS:  Bladder neck stenosis.   POSTOPERATIVE DIAGNOSIS:  Bladder neck stenosis.   PROCEDURE:  Holmium laser ablation of the bladder neck and incision of the  bladder neck.   SURGEON:  Ky Barban, M.D.   INDICATIONS:  This patient had radicular retropubic prostatectomy and has  developed bladder neck stenosis.  He has carcinoma of the prostate.   DESCRIPTION OF PROCEDURE:  The patient under general endotracheal  anesthesia, placed in the lithotomy position after the usual prep and drape.  A #22 cystoscopy introduced under direct vision.  The anterior urethra looks  normal.  The bladder neck appears to be quite tight.  I do not see any gross  evidence of cancer in this area.  Then, using Holmium laser and side firing  fiber, the bladder neck was ablated and at 3 o'clock and 9 o'clock position  an incision into the bladder neck was made.  I can see the wide open bladder  neck is obtained.  There is no bleeding.  The bladder was inspected, it  looks normal.  I left a #18 Foley catheter.  The patient left the operating  room in satisfactory condition.      Ky Barban, M.D.  Electronically Signed     MIJ/MEDQ  D:  07/03/2005  T:  07/03/2005  Job:  [id number]

## 2010-11-07 NOTE — Discharge Summary (Signed)
Anthony Austin, Anthony Austin                  ACCOUNT NO.:  [id number]   MEDICAL RECORD NO.:  [id number]          PATIENT TYPE:  INP   LOCATION:  A207                          FACILITY:  [facility]   PHYSICIAN:  Ky Barban, M.D.DATE OF BIRTH:  [birth year]   DATE OF ADMISSION:  03/10/2005  DATE OF DISCHARGE:  09/26/2006LH                                 DISCHARGE SUMMARY   HISTORY OF PRESENT ILLNESS:  A 50 year old gentleman who had prostate  screening at clinic and was found to have elevated PSA.  He came to see me  and we did prostate biopsy that came back positive for prostate cancer with  Gleason score 5.  After discussing various treatment options, it was decided  to go ahead and do radical prostatectomy.  Family history is significant in  that his father had prostate cancer.  No other significant medical problems.  He has never been sick.   HOSPITAL COURSE:  Routine admission workup is done.  CBC shows WBC 5.1,  hematocrit 45.6.  Electrolytes with sodium 136, potassium 4.1, chloride 102,  CO2 30, glucose 96, BUN 11, creatinine 1, calcium 9.4.  He was taken to the  operating room on September 19, and underwent bilateral pelvic node  dissections and radical retropubic prostatectomy.  His frozen sections from  the lymph nodes were negative.  During the procedure, he did not require any  blood transfusion.  On postop day #1, temperature is 101.5, blood pressure  120/80.  General status is good.  Bowel sounds are present with no flatus.  Abdomen is soft and urine is clear.  His MET 7 showed a sodium of 135,  potassium 3.6, chloride 104, CO2 28, BUN 7, creatinine 1, glucose 123,  hematocrit 29.4.  He was started on incentive spirometry.  On postop day #2,  his temperature is 101 and then he became afebrile.  General status is good.  Hematocrit is 30.5.  I started him on a clear liquid diet.  On postop day  #3, his temperature is 101.6.  General status is good.  Chest x-ray was done  with no  pathology in the lung that showed ileus.  He is using a lot of  marking for pain and I discouraged him to do that.  He was discharged from  ICU.  On postop day #4, temperature is 100.7.  Intake/output is as expected.  Increased IV to 125 mL per hour.  Potassium was low.  He continued to run a  low-grade fever.  On postop day #6, his temperature is 98.4, blood pressure  148/80 and still had four large bowel movements so we discontinued his  Levaquin and did a C. difficile culture which is still pending.  He did not  have any bowel movement since yesterday.  He is on a regular diet up and  walking around.  The wound is healing up nicely.  His pathology final report  is pending.  I will see him back Thursday to remove his stitches.  He is  going to be discharged home.   DISCHARGE DIAGNOSIS:  Carcinoma of  prostate.   CONDITION ON DISCHARGE:  Improved.   DISCHARGE MEDICATIONS:  None.     Ky Barban, M.D.  Electronically Signed    MIJ/MEDQ  D:  03/17/2005  T:  03/17/2005  Job:  [id number]

## 2010-11-07 NOTE — H&P (Signed)
NAME:  Anthony Austin, Anthony Austin                  ACCOUNT NO.:  [id number]   MEDICAL RECORD NO.:  [id number]          PATIENT TYPE:  AMB   LOCATION:  DAY                           FACILITY:  [facility]   PHYSICIAN:  Ky Barban, M.D.DATE OF BIRTH:  03-16-1961   DATE OF ADMISSION:  DATE OF DISCHARGE:  LH                                HISTORY & PHYSICAL   This 50 year old gentleman who is known to have prostate cancer has  undergone radical retropubic prostatectomy a couple of months ago. He is  having difficulty to void, and I am suspecting he has developed bladder neck  stenosis, so I am going to cystoscope him and dilate him. I may have to  resect the bladder neck. I explained this procedure, its limitations,  complications and he understands and wants me to proceed. He is still having  incontinence from his surgery. He uses four or five pads every day. I told  him after this procedure it might get worse.   PAST HISTORY:  He has no other medical problem. No history of diabetes or  hypertension.   FAMILY HISTORY:  Father had prostate cancer.   PERSONAL HISTORY:  Does not smoke or drink.   REVIEW OF SYSTEMS:  Unremarkable.   PHYSICAL EXAMINATION:  VITAL SIGNS:  Blood pressure 156/99, temperature is  normal.  CENTRAL NERVOUS SYSTEM:  No gross neurological deficit.  HEENT:  Negative.  CHEST:  Symmetrical.  HEART:  Regular sinus rhythm. No murmur.  ABDOMEN:  Soft, flat. Liver, spleen and kidneys are not palpable.  GENITOURINARY:  External genitalia is circumcised. Meatus adequate.  Testicles are normal. There is tenderness along the bulbar urethra.  RECTAL:  Deferred.  EXTREMITIES:  Normal.   IMPRESSION:  Bladder neck stenosis. Carcinoma of prostate.   PLAN:  Cysto dilation under anesthesia.      Ky Barban, M.D.  Electronically Signed    MIJ/MEDQ  D:  05/07/2005  T:  05/07/2005  Job:  [id number]

## 2010-11-07 NOTE — H&P (Signed)
NAME:  Anthony Austin, Anthony Austin                  ACCOUNT NO.:  [id number]   MEDICAL RECORD NO.:  [id number]          PATIENT TYPE:  AMB   LOCATION:  DAY                           FACILITY:  [facility]   PHYSICIAN:  Ky Barban, M.D.DATE OF BIRTH:  1961/04/27   DATE OF ADMISSION:  DATE OF DISCHARGE:  LH                                HISTORY & PHYSICAL   CHIEF COMPLAINT:  Carcinoma of prostate.   HISTORY OF PRESENT ILLNESS:  This 50 year old gentleman had his PSA done in  local free prostate screening event with protein of 10.7 __________.  The  patient came to see me because I have taken care of his father.  He has no  urological complaint, no other health problem.  We repeated his PSA.  Came  back at 8.9.  Because of his age I ___________ prostate biopsy which was  done and unfortunately came back positive.  Multiple biopsies were positive  for prostate cancer.  Gleason's score is 5, and I recommended he undergo  radical prostatectomy, although we discussed radiotherapy.  His father had  the same problem, and he is doing well.  He had surgery, so patient decided  himself that he wanted to undergo a radical prostatectomy, and that is what  I have recommended.  I went over in detail about procedure, its  complications, especially #1. use of blood.  #2.  Urinary incontinence which  can be permanent. #3.  Erectile dysfunction which will be permanent.  He  understands and wants to go ahead and proceed.   PAST MEDICAL HISTORY:  He never had significant medical problems, never had  surgery.  No diabetes or hypertension.   FAMILY HISTORY:  His father has prostate cancer.   PERSONAL HISTORY:  Does not smoke or drink.   REVIEW OF SYSTEMS:  Unremarkable.   PHYSICAL EXAMINATION:  VITAL SIGNS:  Blood pressure 156/99, temperature  98.2.  CENTRAL NERVOUS SYSTEM: No gross neurological deficit.  HEAD AND NECK: ENT negative.  CHEST: Symmetrical.  HEART:  Regular sinus rhythm, no murmur.  ABDOMEN:   DICTATION STOPS HERE      Ky Barban, M.D.  Electronically Signed     MIJ/MEDQ  D:  03/09/2005  T:  03/09/2005  Job:  [id number]

## 2012-02-12 ENCOUNTER — Other Ambulatory Visit: Payer: Self-pay | Admitting: Family Medicine

## 2012-02-12 DIAGNOSIS — N492 Inflammatory disorders of scrotum: Secondary | ICD-10-CM

## 2012-02-16 ENCOUNTER — Ambulatory Visit (HOSPITAL_COMMUNITY)
Admission: RE | Admit: 2012-02-16 | Discharge: 2012-02-16 | Disposition: A | Discharge status: Home / Self Care | Payer: Managed Care, Other (non HMO) | Source: Ambulatory Visit | Attending: Family Medicine | Admitting: Family Medicine

## 2012-02-16 ENCOUNTER — Other Ambulatory Visit (HOSPITAL_COMMUNITY): Payer: Self-pay | Admitting: Family Medicine

## 2012-02-16 DIAGNOSIS — N492 Inflammatory disorders of scrotum: Secondary | ICD-10-CM

## 2012-02-16 DIAGNOSIS — N508 Other specified disorders of male genital organs: Secondary | ICD-10-CM | POA: Insufficient documentation

## 2012-02-16 DIAGNOSIS — N433 Hydrocele, unspecified: Secondary | ICD-10-CM | POA: Insufficient documentation

## 2012-02-16 DIAGNOSIS — R9389 Abnormal findings on diagnostic imaging of other specified body structures: Secondary | ICD-10-CM | POA: Insufficient documentation

## 2012-09-12 ENCOUNTER — Other Ambulatory Visit (INDEPENDENT_AMBULATORY_CARE_PROVIDER_SITE_OTHER): Payer: Managed Care, Other (non HMO)

## 2012-09-12 DIAGNOSIS — R7989 Other specified abnormal findings of blood chemistry: Secondary | ICD-10-CM

## 2012-09-12 LAB — COMPREHENSIVE METABOLIC PANEL
ALT: 52 U/L (ref 0–53)
AST: 28 U/L (ref 0–37)
Alkaline Phosphatase: 67 U/L (ref 39–117)
Chloride: 102 mEq/L (ref 96–112)
Creat: 1.1 mg/dL (ref 0.50–1.35)
Glucose, Bld: 102 mg/dL — ABNORMAL HIGH (ref 70–99)
Sodium: 140 mEq/L (ref 135–145)
Total Bilirubin: 1 mg/dL (ref 0.3–1.2)
Total Protein: 6.9 g/dL (ref 6.0–8.3)

## 2012-09-12 NOTE — Progress Notes (Signed)
Patient here for labs only. 

## 2012-09-12 NOTE — Progress Notes (Signed)
Patient came in for labs only.

## 2012-09-13 ENCOUNTER — Other Ambulatory Visit: Payer: Self-pay | Admitting: Physician Assistant

## 2012-09-13 ENCOUNTER — Telehealth: Payer: Self-pay | Admitting: *Deleted

## 2012-09-13 NOTE — Addendum Note (Signed)
Addended by: Orma Render F on: 09/13/2012 11:49 AM   Modules accepted: Orders

## 2012-09-13 NOTE — Progress Notes (Signed)
Quick Note:  Labs were within normal limits Not diabetic ______

## 2012-09-13 NOTE — Telephone Encounter (Signed)
Message copied by Gwenith Daily on Tue Sep 13, 2012  5:21 PM ------      Message from: Horald Pollen      Created: Tue Sep 13, 2012 12:53 PM       Labs were within normal limits      Not diabetic ------

## 2012-09-13 NOTE — Progress Notes (Signed)
Quick Note:  Labs were within normal limits ______ 

## 2012-09-13 NOTE — Telephone Encounter (Signed)
Notified patient that A1C was WNL and did not indicate diabetes.  Pt will f/u as recommended.

## 2012-09-19 ENCOUNTER — Other Ambulatory Visit: Payer: Self-pay

## 2012-09-19 MED ORDER — ROSUVASTATIN CALCIUM 10 MG PO TABS: 10.0000 mg | ORAL_TABLET | Freq: Every day | ORAL | Status: DC

## 2013-08-02 ENCOUNTER — Telehealth: Payer: Self-pay | Admitting: Family Medicine

## 2013-08-04 ENCOUNTER — Other Ambulatory Visit: Payer: Self-pay | Admitting: Family Medicine

## 2013-08-04 NOTE — Telephone Encounter (Signed)
Do not have bp med or fluid med on chart

## 2013-08-08 NOTE — Telephone Encounter (Signed)
Detailed message left for patient that we did not have the bp and fluid pill on his medication list and we will take care of tomorrow at his appt

## 2013-08-09 ENCOUNTER — Ambulatory Visit (INDEPENDENT_AMBULATORY_CARE_PROVIDER_SITE_OTHER): Payer: BC Managed Care – PPO | Admitting: Family Medicine

## 2013-08-09 ENCOUNTER — Encounter: Payer: Self-pay | Admitting: Family Medicine

## 2013-08-09 VITALS — BP 159/98 | HR 61 | Temp 98.0°F | Ht 67.5 in | Wt 185.0 lb

## 2013-08-09 DIAGNOSIS — I10 Essential (primary) hypertension: Secondary | ICD-10-CM

## 2013-08-09 DIAGNOSIS — N5089 Other specified disorders of the male genital organs: Secondary | ICD-10-CM

## 2013-08-09 DIAGNOSIS — Z0000 Encounter for general adult medical examination without abnormal findings: Secondary | ICD-10-CM

## 2013-08-09 LAB — POCT CBC
Granulocyte percent: 59.3 %G (ref 37–80)
HCT, POC: 50.8 % (ref 43.5–53.7)
Hemoglobin: 16.3 g/dL (ref 14.1–18.1)
Lymph, poc: 2 (ref 0.6–3.4)
MCH, POC: 30 pg (ref 27–31.2)
MCHC: 32 g/dL (ref 31.8–35.4)
MCV: 93.8 fL (ref 80–97)
MPV: 8.5 fL (ref 0–99.8)
POC Granulocyte: 3.2 (ref 2–6.9)
POC LYMPH PERCENT: 36.7 %L (ref 10–50)
Platelet Count, POC: 240 10*3/uL (ref 142–424)
RBC: 5.4 M/uL (ref 4.69–6.13)
RDW, POC: 12.5 %
WBC: 5.4 10*3/uL (ref 4.6–10.2)

## 2013-08-09 MED ORDER — LISINOPRIL-HYDROCHLOROTHIAZIDE 20-25 MG PO TABS: 1.0000 | ORAL_TABLET | Freq: Every day | ORAL | Status: DC

## 2013-08-09 NOTE — Progress Notes (Signed)
   Subjective:    Patient ID: Anthony Austin, male    DOB: 08/26/1960, 53 y.o.   MRN: [id number]  HPI  This 53 y.o. male presents for evaluation of CPE. He has hx of hypertension and hyperlipidemia. He has hx of prostate cancer and radical prostectomy 9 years ago.  He has had a colonoscopy Last year and it was working.  He has left testicular mass that was [place] last year and was recommended to be followed up on if no changes and he states the mass has not changed.     Review of Systems C/o left testicular mass   No chest pain, SOB, HA, dizziness, vision change, N/V, diarrhea, constipation, dysuria, urinary urgency or frequency, myalgias, arthralgias or rash.  Objective:   Physical Exam  Vital signs noted  Well developed well nourished male.  HEENT - Head atraumatic Normocephalic                Eyes - PERRLA, Conjuctiva - clear Sclera- Clear EOMI                Ears - EAC's Wnl TM's Wnl Gross Hearing WNL                Nose - Nares patent                 Throat - oropharanx wnl Respiratory - Lungs CTA bilateral Cardiac - RRR S1 and S2 without murmur GI - Abdomen soft Nontender and bowel sounds active x 4 GU - Left testes with soft mass  Extremities - No edema. Neuro - Grossly intact.      Assessment & Plan:  Routine general medical examination at a health care facility - Plan: POCT CBC, CMP14+EGFR, Lipid panel, PSA, total and free, TSH, Vit D  25 hydroxy (rtn osteoporosis monitoring)  Unspecified essential hypertension - Plan: lisinopril-hydrochlorothiazide (PRINZIDE,ZESTORETIC) 20-25 MG per tablet  Testicular mass - Plan: US Scrotum, Ambulatory referral to Urology  Lysbeth Penner FNP

## 2013-08-10 ENCOUNTER — Telehealth: Payer: Self-pay | Admitting: Family Medicine

## 2013-08-10 LAB — LIPID PANEL
Chol/HDL Ratio: 2.9 ratio units (ref 0.0–5.0)
Cholesterol, Total: 118 mg/dL (ref 100–199)
HDL: 41 mg/dL (ref >39)
LDL Calculated: 61 mg/dL (ref 0–99)
Triglycerides: 78 mg/dL (ref 0–149)
VLDL Cholesterol Cal: 16 mg/dL (ref 5–40)

## 2013-08-10 LAB — CMP14+EGFR
ALT: 49 IU/L — ABNORMAL HIGH (ref 0–44)
AST: 34 IU/L (ref 0–40)
Albumin/Globulin Ratio: 2.6 — ABNORMAL HIGH (ref 1.1–2.5)
Albumin: 4.9 g/dL (ref 3.5–5.5)
Alkaline Phosphatase: 70 IU/L (ref 39–117)
BUN/Creatinine Ratio: 11 (ref 9–20)
BUN: 13 mg/dL (ref 6–24)
CO2: 28 mmol/L (ref 18–29)
Calcium: 10.1 mg/dL (ref 8.7–10.2)
Chloride: 101 mmol/L (ref 97–108)
Creatinine, Ser: 1.17 mg/dL (ref 0.76–1.27)
GFR calc Af Amer: 82 mL/min/{1.73_m2} (ref >59)
GFR calc non Af Amer: 71 mL/min/{1.73_m2} (ref >59)
Globulin, Total: 1.9 g/dL (ref 1.5–4.5)
Glucose: 101 mg/dL — ABNORMAL HIGH (ref 65–99)
Potassium: 5.4 mmol/L — ABNORMAL HIGH (ref 3.5–5.2)
Sodium: 143 mmol/L (ref 134–144)
Total Bilirubin: 0.8 mg/dL (ref 0.0–1.2)
Total Protein: 6.8 g/dL (ref 6.0–8.5)

## 2013-08-10 LAB — PSA, TOTAL AND FREE
PSA, Free Pct: 2.9 %
PSA, Free: 0.02 ng/mL
PSA: 0.7 ng/mL (ref 0.0–4.0)

## 2013-08-10 LAB — TSH: TSH: 1.17 u[IU]/mL (ref 0.450–4.500)

## 2013-08-10 LAB — VITAMIN D 25 HYDROXY (VIT D DEFICIENCY, FRACTURES): Vit D, 25-Hydroxy: 25.4 ng/mL — ABNORMAL LOW (ref 30.0–100.0)

## 2013-08-10 NOTE — Telephone Encounter (Signed)
Patient aware.

## 2013-08-10 NOTE — Telephone Encounter (Signed)
Message copied by Waverly Ferrari on Thu Aug 10, 2013 11:34 AM ------      Message from: Lysbeth Penner      Created: Thu Aug 10, 2013  8:30 AM       Take Vitamin D 1000 iu po qd otc.  K is elevated and need to repeat and labs look good otherwise ------

## 2013-08-14 ENCOUNTER — Other Ambulatory Visit: Payer: Self-pay | Admitting: Family Medicine

## 2013-08-14 ENCOUNTER — Ambulatory Visit (HOSPITAL_COMMUNITY)
Admission: RE | Admit: 2013-08-14 | Discharge: 2013-08-14 | Disposition: A | Discharge status: Home / Self Care | Payer: BC Managed Care – PPO | Source: Ambulatory Visit | Attending: Family Medicine | Admitting: Family Medicine

## 2013-08-14 DIAGNOSIS — N5089 Other specified disorders of the male genital organs: Secondary | ICD-10-CM

## 2013-08-14 DIAGNOSIS — N508 Other specified disorders of male genital organs: Secondary | ICD-10-CM | POA: Insufficient documentation

## 2013-08-14 DIAGNOSIS — N433 Hydrocele, unspecified: Secondary | ICD-10-CM | POA: Insufficient documentation

## 2013-08-15 ENCOUNTER — Telehealth: Payer: Self-pay | Admitting: Family Medicine

## 2013-08-15 NOTE — Telephone Encounter (Signed)
Message copied by Waverly Ferrari on Tue Aug 15, 2013 12:35 PM ------      Message from: Lysbeth Penner      Created: Mon Aug 14, 2013  5:53 PM       Left scrotal lesion that according to [place] reading by radiologist is favoring a benign lesion and is       Unchanged since last scrotal US.  Would still go to urology to have them evaluate this. ------

## 2013-09-06 ENCOUNTER — Encounter: Payer: Self-pay | Admitting: Family Medicine

## 2013-09-06 ENCOUNTER — Ambulatory Visit (INDEPENDENT_AMBULATORY_CARE_PROVIDER_SITE_OTHER): Payer: BC Managed Care – PPO | Admitting: Family Medicine

## 2013-09-06 VITALS — BP 120/80 | HR 70 | Temp 98.6°F | Ht 67.5 in | Wt 177.0 lb

## 2013-09-06 DIAGNOSIS — M792 Neuralgia and neuritis, unspecified: Secondary | ICD-10-CM

## 2013-09-06 DIAGNOSIS — I10 Essential (primary) hypertension: Secondary | ICD-10-CM

## 2013-09-06 DIAGNOSIS — IMO0002 Reserved for concepts with insufficient information to code with codable children: Secondary | ICD-10-CM

## 2013-09-06 NOTE — Progress Notes (Signed)
   Subjective:    Patient ID: Anthony Austin, male    DOB: 06-08-1961, 53 y.o.   MRN: [id number]  HPI  This 53 y.o. male presents for evaluation of follow up on hypertension.  He has some neuralgia in the bottom of his feet.  Review of Systems No chest pain, SOB, HA, dizziness, vision change, N/V, diarrhea, constipation, dysuria, urinary urgency or frequency, myalgias, arthralgias or rash.     Objective:   Physical Exam Vital signs noted  Well developed well nourished male.  HEENT - Head atraumatic Normocephalic                Eyes - PERRLA, Conjuctiva - clear Sclera- Clear EOMI                Ears - EAC's Wnl TM's Wnl Gross Hearing WNL                Nose - Nares patent                 Throat - oropharanx wnl Respiratory - Lungs CTA bilateral Cardiac - RRR S1 and S2 without murmur GI - Abdomen soft Nontender and bowel sounds active x 4 Extremities - No edema. Neuro - Grossly intact.       Assessment & Plan:  HTN (hypertension) - Plan: BMP8+EGFR.  Bp is much better and follow up in 6 months  Neuralgia - Plan: Vitamin B12  Lysbeth Penner FNP

## 2013-09-07 LAB — BMP8+EGFR
BUN/Creatinine Ratio: 13 (ref 9–20)
BUN: 14 mg/dL (ref 6–24)
CO2: 28 mmol/L (ref 18–29)
Calcium: 10.2 mg/dL (ref 8.7–10.2)
Chloride: 95 mmol/L — ABNORMAL LOW (ref 97–108)
Creatinine, Ser: 1.1 mg/dL (ref 0.76–1.27)
GFR calc Af Amer: 88 mL/min/{1.73_m2} (ref >59)
GFR calc non Af Amer: 76 mL/min/{1.73_m2} (ref >59)
Glucose: 96 mg/dL (ref 65–99)
Potassium: 4.4 mmol/L (ref 3.5–5.2)
Sodium: 141 mmol/L (ref 134–144)

## 2013-09-07 LAB — VITAMIN B12: Vitamin B-12: 590 pg/mL (ref 211–946)

## 2013-09-12 ENCOUNTER — Ambulatory Visit (INDEPENDENT_AMBULATORY_CARE_PROVIDER_SITE_OTHER): Payer: BC Managed Care – PPO | Admitting: Urology

## 2013-09-12 DIAGNOSIS — N529 Male erectile dysfunction, unspecified: Secondary | ICD-10-CM

## 2013-09-12 DIAGNOSIS — N508 Other specified disorders of male genital organs: Secondary | ICD-10-CM

## 2013-09-13 ENCOUNTER — Other Ambulatory Visit: Payer: Self-pay

## 2013-09-13 DIAGNOSIS — N5089 Other specified disorders of the male genital organs: Secondary | ICD-10-CM

## 2013-09-14 ENCOUNTER — Other Ambulatory Visit (HOSPITAL_COMMUNITY): Payer: BC Managed Care – PPO

## 2014-04-14 IMAGING — US US SCROTUM
1 series · 13 of 25 positions shown · non-contrast
Comparison: None

CLINICAL DATA: Palpable abnormality left scrotum, nodule, past
history prostate cancer

SCROTAL ULTRASOUND
DOPPLER ULTRASOUND OF THE TESTICLES
TECHNIQUE: Complete ultrasound examination of the testicles,
epididymis, and other scrotal structures was performed.  Color and
spectral Doppler ultrasound were also utilized to evaluate blood
flow to the testicles.

[Series 1: us scrotum · 0.08mm/px · 75 acquisitions, 13 frames shown]
[im 1/75]
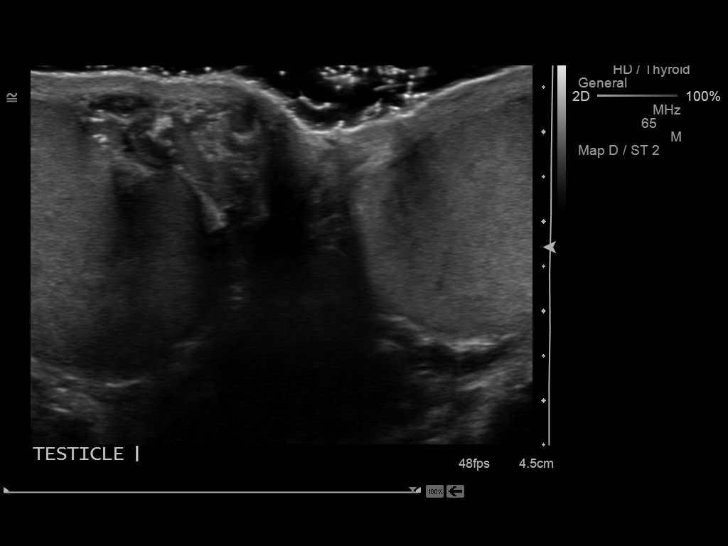
[im 7/75]
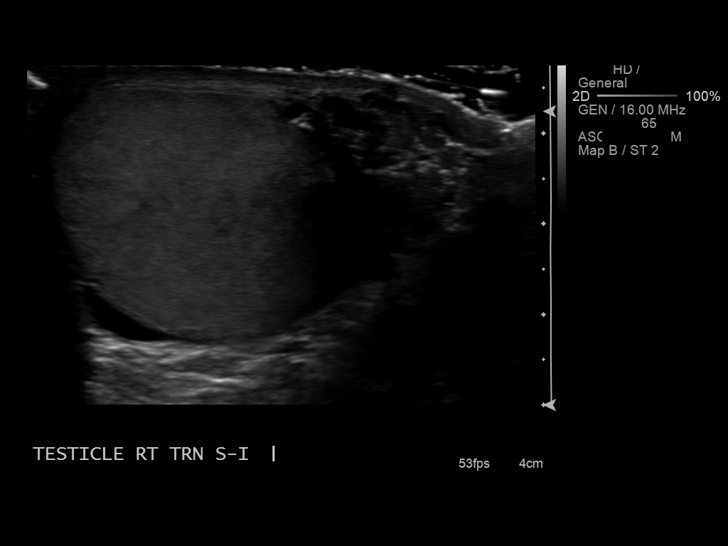
[im 13/75]
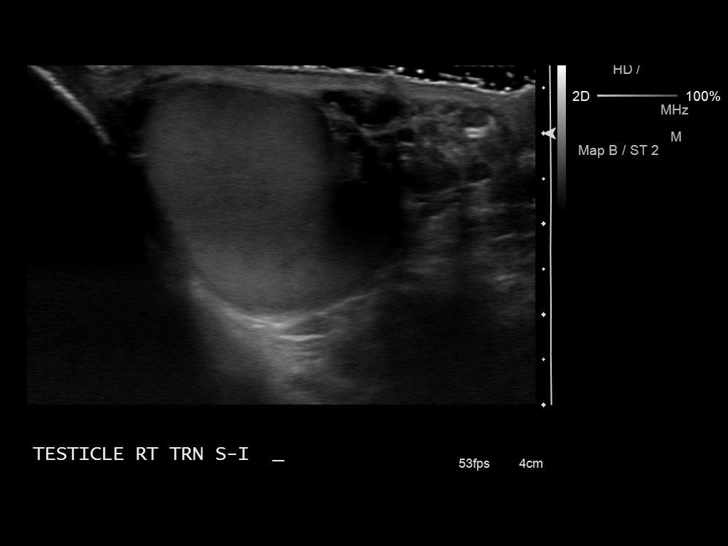
[im 19/75]
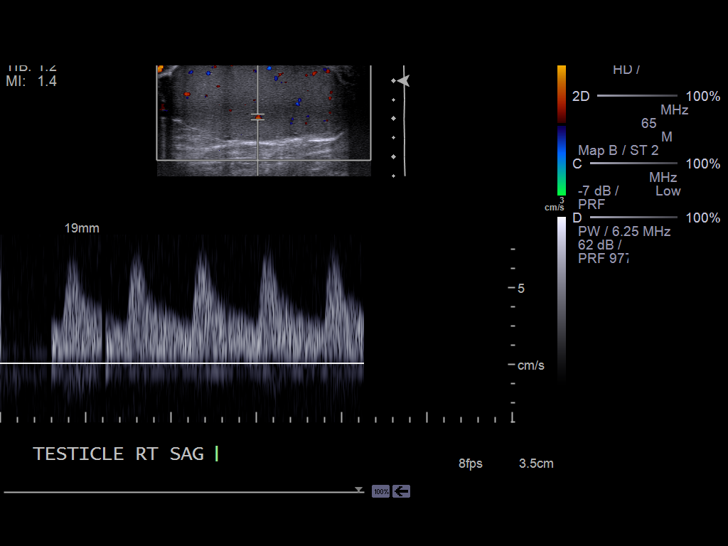
[im 25/75]
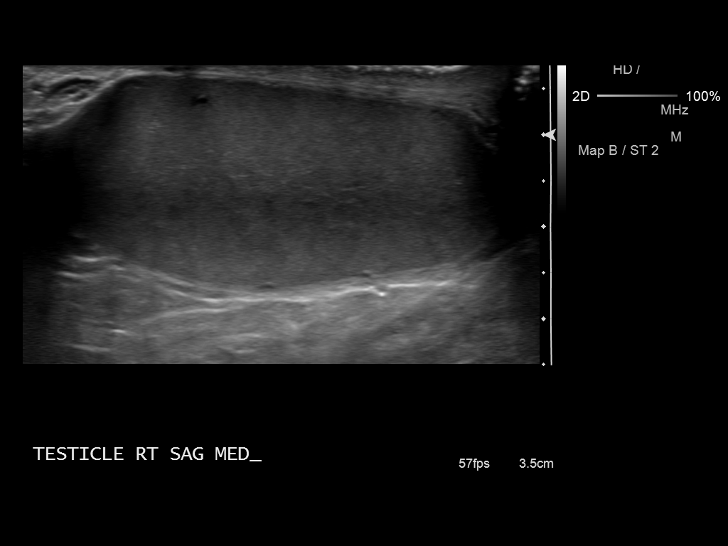
[im 31/75]
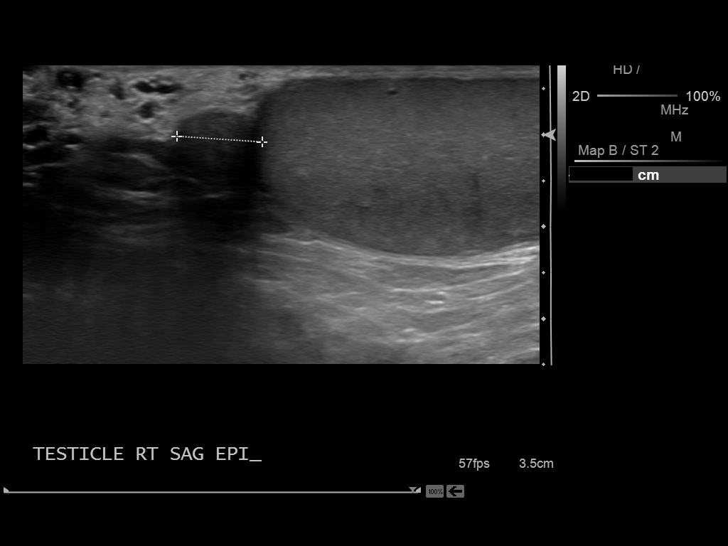
[im 38/75]
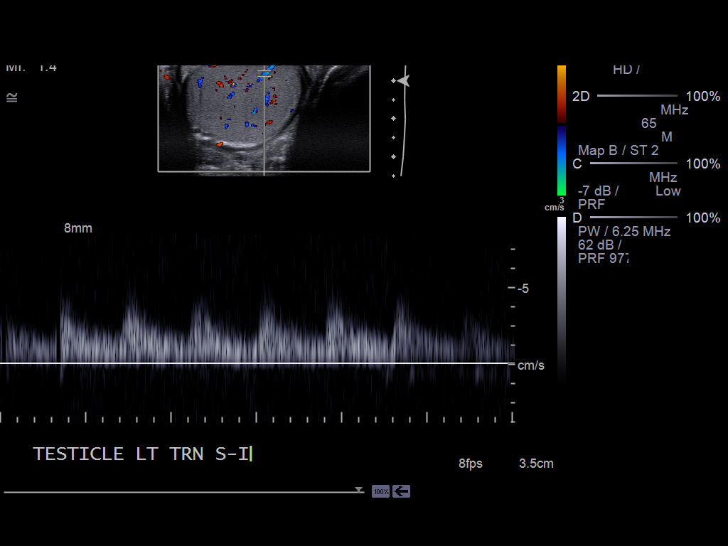
[im 44/75]
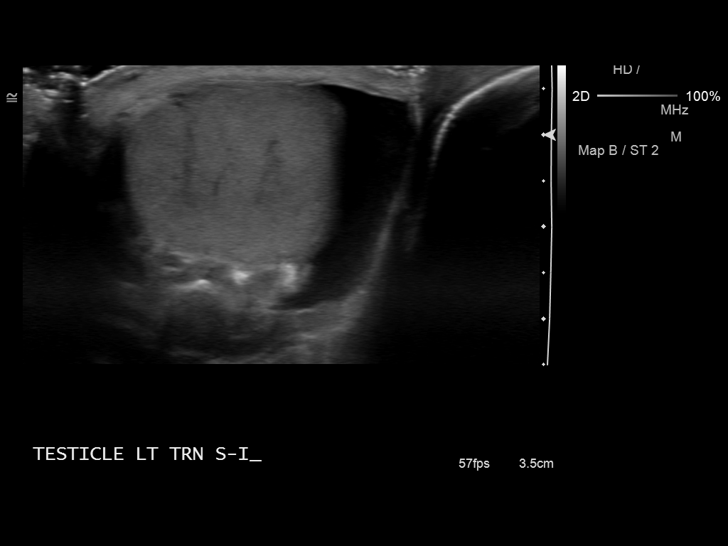
[im 50/75]
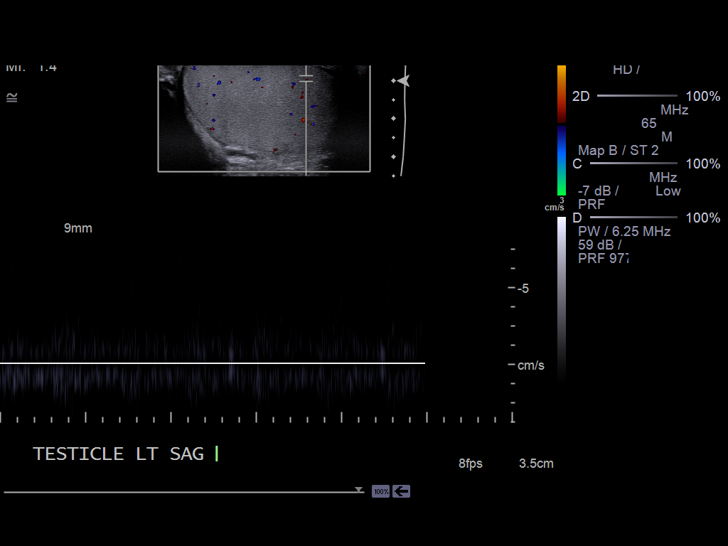
[im 56/75]
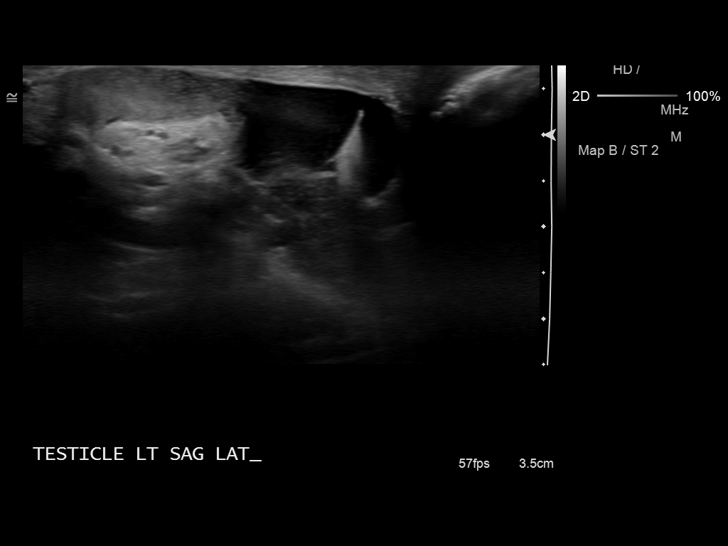
[im 62/75]
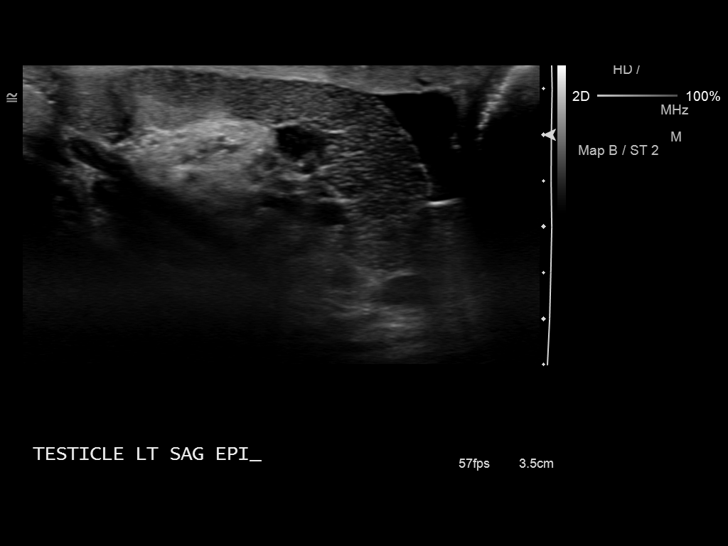
[im 68/75]
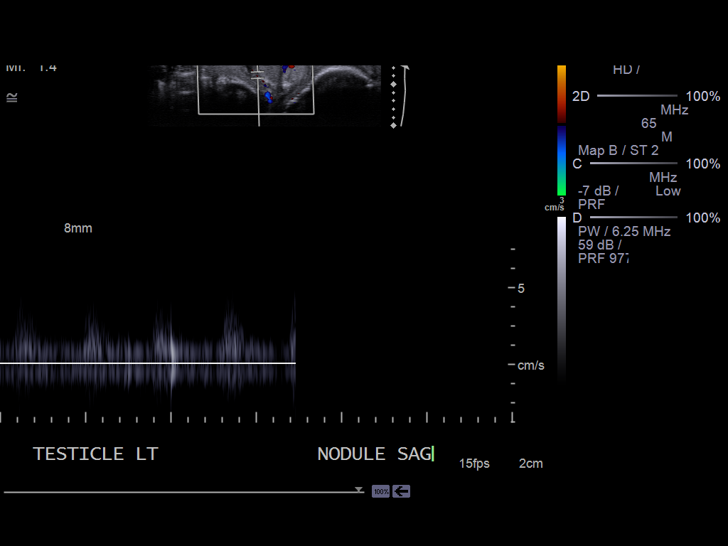
[im 75/75]
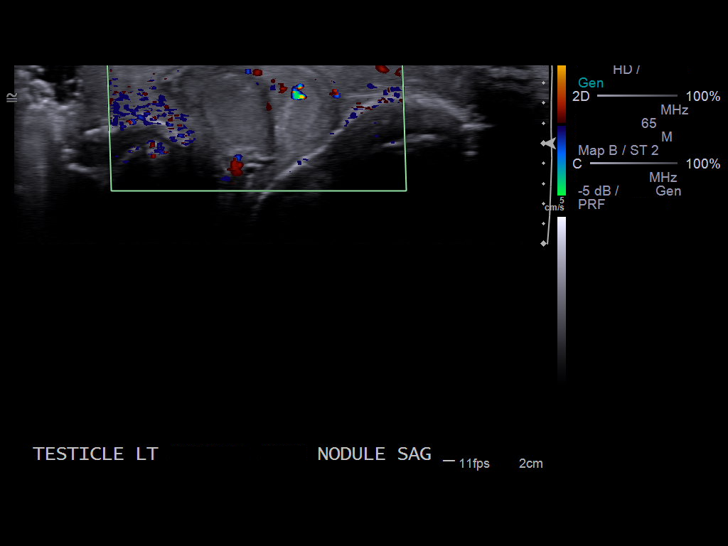

[13 of 25 positions shown; findings below may reference images not displayed]

FINDINGS: Right testis:  4.9 x 2.2 x 3.0 cm.  Normal homogeneous
intratesticular echogenicity.  No evidence of mass or
calcification.  Internal blood flow present on color Doppler
imaging.

Left testis:  4.8 x 2.6 x 3.3 cm.  Normal homogeneous
intratesticular echogenicity.  No evidence of intratesticular mass
or calcification.  Internal blood flow present on color Doppler
imaging.  Additionally however, a superficial heterogeneous
isoechoic hypoechoic nodule is identified at the left hemi scrotum,
external to the testis and likely external to tunica.  Mass
measures 1.6 x 1.2 x 0.9 cm in size and has a somewhat bilobed
appearance. This lesion demonstrates internal blood flow on color
Doppler imaging.  No surrounding hypervascularity or internal
calcifications identified.  Lesion is nontender with transducer
pressure.

Right epididymis:  Normal in size and appearance.

Left epididymis:  Normal in size and appearance.

Hydrocele:  Small bilateral minimally complicated nonspecific
hydroceles containing scattered echogenic foci.

Varicocele:  Absent bilaterally

Pulsed Doppler interrogation of both testes demonstrates internal
low resistance arterial andvenous waveforms.
IMPRESSION: Normal appearing testes and epididymi.
Small bilateral minimally complicated nonspecific hydroceles.

Superficial nodule within left hemi scrotum 1.6 x 1.2 x 0.9 cm in
size, appearing deep to scrotal skin and external/superficial to
the testis and tunica, of uncertain etiology.
The presence of internal blood flow on color Doppler imaging makes
sebaceous cyst and epidermal inclusion cyst less likely.
Lesion appears neither inflammatory nor suspicious for lipomatous
origin.
Other differential considerations would include fibroma, neuroma,
unlikely hemangioma.
Other subcutaneous nodules of cutaneous origin are not excluded.
Lesion would be atypical for spread from prostate cancer.
Clinical management recommended.
If this fails to resolve, excisional biopsy recommended.

## 2014-08-02 ENCOUNTER — Other Ambulatory Visit: Payer: Self-pay | Admitting: Family Medicine

## 2014-08-29 ENCOUNTER — Other Ambulatory Visit: Payer: Self-pay | Admitting: Family Medicine

## 2014-08-30 ENCOUNTER — Telehealth: Payer: Self-pay | Admitting: Family Medicine

## 2014-08-30 DIAGNOSIS — I10 Essential (primary) hypertension: Secondary | ICD-10-CM

## 2014-08-30 MED ORDER — LISINOPRIL-HYDROCHLOROTHIAZIDE 20-25 MG PO TABS: 1.0000 | ORAL_TABLET | Freq: Every day | ORAL | Status: DC

## 2014-08-30 NOTE — Telephone Encounter (Signed)
Pt must be seen, been over 1 year. He is aware

## 2014-08-30 NOTE — Telephone Encounter (Signed)
done

## 2014-09-20 ENCOUNTER — Encounter: Payer: Self-pay | Admitting: Family Medicine

## 2014-09-20 ENCOUNTER — Ambulatory Visit (INDEPENDENT_AMBULATORY_CARE_PROVIDER_SITE_OTHER): Payer: BLUE CROSS/BLUE SHIELD | Admitting: Family Medicine

## 2014-09-20 VITALS — BP 127/78 | HR 68 | Temp 98.0°F | Ht 67.5 in | Wt 188.0 lb

## 2014-09-20 DIAGNOSIS — I10 Essential (primary) hypertension: Secondary | ICD-10-CM

## 2014-09-20 DIAGNOSIS — C61 Malignant neoplasm of prostate: Secondary | ICD-10-CM

## 2014-09-20 DIAGNOSIS — R5383 Other fatigue: Secondary | ICD-10-CM

## 2014-09-20 DIAGNOSIS — E785 Hyperlipidemia, unspecified: Secondary | ICD-10-CM

## 2014-09-20 LAB — POCT CBC
Granulocyte percent: 66.3 %G (ref 37–80)
HEMATOCRIT: 45.9 % (ref 43.5–53.7)
Hemoglobin: 15.3 g/dL (ref 14.1–18.1)
LYMPH, POC: 2 (ref 0.6–3.4)
MCH: 31.3 pg — AB (ref 27–31.2)
MCHC: 33.3 g/dL (ref 31.8–35.4)
MCV: 94 fL (ref 80–97)
MPV: 8.5 fL (ref 0–99.8)
POC GRANULOCYTE: 4.5 (ref 2–6.9)
POC LYMPH %: 29.6 % (ref 10–50)
Platelet Count, POC: 229 10*3/uL (ref 142–424)
RBC: 4.88 M/uL (ref 4.69–6.13)
RDW, POC: 13 %
WBC: 6.8 10*3/uL (ref 4.6–10.2)

## 2014-09-20 MED ORDER — LISINOPRIL-HYDROCHLOROTHIAZIDE 20-25 MG PO TABS
1.0000 | ORAL_TABLET | Freq: Every day | ORAL | Status: DC
Start: 2014-09-20 — End: 2015-10-30

## 2014-09-21 LAB — CMP14+EGFR
A/G RATIO: 2.3 (ref 1.1–2.5)
ALBUMIN: 4.8 g/dL (ref 3.5–5.5)
ALT: 39 IU/L (ref 0–44)
AST: 28 IU/L (ref 0–40)
Alkaline Phosphatase: 61 IU/L (ref 39–117)
BILIRUBIN TOTAL: 0.5 mg/dL (ref 0.0–1.2)
BUN/Creatinine Ratio: 15 (ref 9–20)
BUN: 15 mg/dL (ref 6–24)
CALCIUM: 9.6 mg/dL (ref 8.7–10.2)
CO2: 29 mmol/L (ref 18–29)
Chloride: 94 mmol/L — ABNORMAL LOW (ref 97–108)
Creatinine, Ser: 1.03 mg/dL (ref 0.76–1.27)
GFR, EST AFRICAN AMERICAN: 95 mL/min/{1.73_m2} (ref >59)
GFR, EST NON AFRICAN AMERICAN: 82 mL/min/{1.73_m2} (ref >59)
GLUCOSE: 118 mg/dL — AB (ref 65–99)
Globulin, Total: 2.1 g/dL (ref 1.5–4.5)
POTASSIUM: 3.7 mmol/L (ref 3.5–5.2)
Sodium: 140 mmol/L (ref 134–144)
TOTAL PROTEIN: 6.9 g/dL (ref 6.0–8.5)

## 2014-09-21 LAB — LIPID PANEL
CHOLESTEROL TOTAL: 179 mg/dL (ref 100–199)
Chol/HDL Ratio: 4.6 ratio units (ref 0.0–5.0)
HDL: 39 mg/dL — ABNORMAL LOW (ref >39)
LDL Calculated: 94 mg/dL (ref 0–99)
Triglycerides: 232 mg/dL — ABNORMAL HIGH (ref 0–149)
VLDL Cholesterol Cal: 46 mg/dL — ABNORMAL HIGH (ref 5–40)

## 2014-09-21 LAB — VITAMIN D 25 HYDROXY (VIT D DEFICIENCY, FRACTURES): Vit D, 25-Hydroxy: 19.8 ng/mL — ABNORMAL LOW (ref 30.0–100.0)

## 2014-09-21 LAB — PSA, TOTAL AND FREE
PSA, Free Pct: 2.2 %
PSA, Free: 0.02 ng/mL
PSA: 0.9 ng/mL (ref 0.0–4.0)

## 2014-09-21 NOTE — Progress Notes (Signed)
Subjective:  Patient ID: Anthony Austin, male    DOB: 11-Aug-1960  Age: 53 y.o. MRN: [id number]  CC: Hypertension   HPI Anthony Austin presents for  follow-up of hypertension. Patient has no history of headache chest pain or shortness of breath or recent cough. Patient also denies symptoms of TIA such as numbness weakness lateralizing. Patient checks  blood pressure at home and has not had any elevated readings recently. Patient denies side effects from his medication. States taking it regularly.  History Anthony Austin has a past medical history of Hyperlipidemia and Hypertension.   He has past surgical history that includes Prostate surgery.   His family history includes Cancer in his father; Diabetes in his mother; Hypertension in his father and mother; Stroke in his mother.He reports that he has never smoked. He does not have any smokeless tobacco history on file. He reports that he does not drink alcohol or use illicit drugs.  No current outpatient prescriptions on file prior to visit.   No current facility-administered medications on file prior to visit.    ROS Review of Systems  Constitutional: Negative for fever, chills, diaphoresis and unexpected weight change.  HENT: Negative for congestion, hearing loss, rhinorrhea, sore throat and trouble swallowing.   Respiratory: Negative for cough, chest tightness, shortness of breath and wheezing.   Gastrointestinal: Negative for nausea, vomiting, abdominal pain, diarrhea, constipation and abdominal distention.  Endocrine: Negative for cold intolerance and heat intolerance.  Genitourinary: Negative for dysuria, hematuria and flank pain.  Musculoskeletal: Negative for joint swelling and arthralgias.  Skin: Negative for rash.  Neurological: Negative for dizziness and headaches.  Psychiatric/Behavioral: Negative for dysphoric mood, decreased concentration and agitation. The patient is not nervous/anxious.     Objective:  BP 127/78 mmHg  Pulse 68   Temp(Src) 98 F (36.7 C) (Oral)  Ht 5' 7.5" (1.715 m)  Wt 188 lb (85.276 kg)  BMI 28.99 kg/m2  BP Readings from Last 3 Encounters:  09/20/14 127/78  09/06/13 120/80  08/09/13 159/98    Wt Readings from Last 3 Encounters:  09/20/14 188 lb (85.276 kg)  09/06/13 177 lb (80.287 kg)  08/09/13 185 lb (83.915 kg)     Physical Exam  Constitutional: He is oriented to person, place, and time. He appears well-developed and well-nourished. No distress.  HENT:  Head: Normocephalic and atraumatic.  Right Ear: External ear normal.  Left Ear: External ear normal.  Nose: Nose normal.  Mouth/Throat: Oropharynx is clear and moist.  Eyes: Conjunctivae and EOM are normal. Pupils are equal, round, and reactive to light.  Neck: Normal range of motion. Neck supple. No thyromegaly present.  Cardiovascular: Normal rate, regular rhythm and normal heart sounds.   No murmur heard. Pulmonary/Chest: Effort normal and breath sounds normal. No respiratory distress. He has no wheezes. He has no rales.  Abdominal: Soft. Bowel sounds are normal. He exhibits no distension. There is no tenderness.  Lymphadenopathy:    He has no cervical adenopathy.  Neurological: He is alert and oriented to person, place, and time. He has normal reflexes.  Skin: Skin is warm and dry.  Psychiatric: He has a normal mood and affect. His behavior is normal. Judgment and thought content normal.    Lab Results  Component Value Date   HGBA1C 5.7 09/13/2012    Lab Results  Component Value Date   WBC 6.8 09/20/2014   HGB 15.3 09/20/2014   HCT 45.9 09/20/2014   PLT 216 10/16/2009   GLUCOSE 118* 09/20/2014  CHOL 179 09/20/2014   TRIG 232* 09/20/2014   HDL 39* 09/20/2014   LDLCALC 94 09/20/2014   ALT 39 09/20/2014   AST 28 09/20/2014   NA 140 09/20/2014   K 3.7 09/20/2014   CL 94* 09/20/2014   CREATININE 1.03 09/20/2014   BUN 15 09/20/2014   CO2 29 09/20/2014   TSH 1.170 08/09/2013   PSA 0.9 09/20/2014   HGBA1C  5.7 09/13/2012    US Scrotum  08/14/2013   CLINICAL DATA:  Left scrotal mass followup.  EXAM: SCROTAL ULTRASOUND  DOPPLER ULTRASOUND OF THE TESTICLES  TECHNIQUE: Complete ultrasound examination of the testicles, epididymis, and other scrotal structures was performed. Color and spectral Doppler ultrasound were also utilized to evaluate blood flow to the testicles.  COMPARISON:  US SCROTUM dated 02/16/2012  FINDINGS: Right testicle  Measurements: 4.3 x 2.7 x 2.6 cm . No mass or microlithiasis visualized.  Left testicle  Measurements: 4.2 x 2.6 x 3.1 cm . No mass or microlithiasis visualized. Within the inferior and superficial aspect of the left scrotal sac, a heterogeneous lesion measures approximately 1.5 x 1.2 x 0.9 cm. This likely corresponds to lesion of similar size and location on exam of 02/16/2012, measuring 1.6 x 1.2 x 0.9 cm. It has internal flow and is the site of palpable abnormality reported by the patient.  Right epididymis: Normal in size and appearance.  Left epididymis: Normal in size and appearance.  Hydrocele: Small bilateral.  Varicocele: Absent.  Pulsed Doppler interrogation of both testes demonstrates low resistance arterial and venous waveforms bilaterally.  IMPRESSION: 1. Heterogeneous lesion in the inferior and superficial aspect of the left scrotal sac, similar in size and appearance from 02/16/2012, favoring a benign lesion. 2. No evidence of an intratesticular mass or torsion. 3. Small bilateral hydroceles.   Electronically Signed   By: Lorin Picket M.D.   On: 08/14/2013 11:09   [place] Art/ven Flow Abd Pelv Doppler  08/14/2013   CLINICAL DATA:  Left scrotal mass followup.  EXAM: SCROTAL ULTRASOUND  DOPPLER ULTRASOUND OF THE TESTICLES  TECHNIQUE: Complete ultrasound examination of the testicles, epididymis, and other scrotal structures was performed. Color and spectral Doppler ultrasound were also utilized to evaluate blood flow to the testicles.  COMPARISON:  US SCROTUM dated  02/16/2012  FINDINGS: Right testicle  Measurements: 4.3 x 2.7 x 2.6 cm. No mass or microlithiasis visualized.  Left testicle  Measurements: 4.2 x 2.6 x 3.1 cm. No mass or microlithiasis visualized. Within the inferior and superficial aspect of the left scrotal sac, a heterogeneous lesion measures approximately 1.5 x 1.2 x 0.9 cm. This likely corresponds to lesion of similar size and location on exam of 02/16/2012, measuring 1.6 x 1.2 x 0.9 cm. It has internal flow and is the site of palpable abnormality reported by the patient.  Right epididymis:  Normal in size and appearance.  Left epididymis:  Normal in size and appearance.  Hydrocele:  Small bilateral.  Varicocele:  Absent.  Pulsed Doppler interrogation of both testes demonstrates low resistance arterial and venous waveforms bilaterally.  IMPRESSION: 1. Heterogeneous lesion in the inferior and superficial aspect of the left scrotal sac, similar in size and appearance from 02/16/2012, favoring a benign lesion. 2. No evidence of an intratesticular mass or torsion. 3. Small bilateral hydroceles.   Electronically Signed   By: Lorin Picket M.D.   On: 08/14/2013 09:43    Assessment & Plan:   Anthony Austin was seen today for hypertension.  Diagnoses and all orders for  this visit:  Essential hypertension Orders: -     CMP14+EGFR -     lisinopril-hydrochlorothiazide (PRINZIDE,ZESTORETIC) 20-25 MG per tablet; Take 1 tablet by mouth daily. -     POCT CBC -     Lipid panel  Other fatigue Orders: -     POCT CBC -     Vit D  25 hydroxy (rtn osteoporosis monitoring)  Prostate cancer Orders: -     PSA, total and free  Other orders -     Lipid panel -     PSA, total and free -     Vit D  25 hydroxy (rtn osteoporosis monitoring)   I am having Anthony Austin maintain his lisinopril-hydrochlorothiazide.  Meds ordered this encounter  Medications  . lisinopril-hydrochlorothiazide (PRINZIDE,ZESTORETIC) 20-25 MG per tablet    Sig: Take 1 tablet by mouth daily.      Dispense:  90 tablet    Refill:  4     Follow-up: Return in about 6 months (around 03/22/2015).  Claretta Fraise, M.D.

## 2014-09-24 ENCOUNTER — Telehealth: Payer: Self-pay | Admitting: Family Medicine

## 2014-09-24 MED ORDER — VITAMIN D (ERGOCALCIFEROL) 1.25 MG (50000 UNIT) PO CAPS
50000.0000 [IU] | ORAL_CAPSULE | ORAL | Status: DC
Start: 2014-09-24 — End: 2014-11-26

## 2014-09-24 NOTE — Telephone Encounter (Signed)
Vitamin D rx sent over to pharmacy and left detailed message and advised pt to CB with any further questions.

## 2014-09-27 ENCOUNTER — Telehealth: Payer: Self-pay | Admitting: *Deleted

## 2014-09-27 NOTE — Telephone Encounter (Signed)
-----   Message from Claretta Fraise, MD sent at 09/21/2014  4:10 PM EDT ----- Dear Anthony Austin,   Your Vitamin D is quite low. You need a high dose supplement. Use 50,000 units twice weekly for two months. I will send that in as a 1 time only prescription.  Then switch to OTC Vitamin D 2000 units daily. Recheck vitamin D level with office visit in 6 months.  Best regards, Claretta Fraise M.D.

## 2014-11-26 ENCOUNTER — Other Ambulatory Visit: Payer: Self-pay | Admitting: Nurse Practitioner

## 2015-01-11 ENCOUNTER — Encounter: Payer: Self-pay | Admitting: *Deleted

## 2015-03-20 ENCOUNTER — Encounter: Payer: Self-pay | Admitting: Family Medicine

## 2015-03-20 ENCOUNTER — Ambulatory Visit (INDEPENDENT_AMBULATORY_CARE_PROVIDER_SITE_OTHER): Payer: BLUE CROSS/BLUE SHIELD | Admitting: Family Medicine

## 2015-03-20 VITALS — BP 126/81 | HR 66 | Temp 97.7°F | Ht 67.5 in | Wt 182.4 lb

## 2015-03-20 DIAGNOSIS — I10 Essential (primary) hypertension: Secondary | ICD-10-CM

## 2015-03-20 DIAGNOSIS — E559 Vitamin D deficiency, unspecified: Secondary | ICD-10-CM

## 2015-03-20 MED ORDER — PHENTERMINE HCL 37.5 MG PO CAPS: 37.5000 mg | ORAL_CAPSULE | ORAL | Status: DC

## 2015-03-20 NOTE — Progress Notes (Signed)
Subjective:  Patient ID: Anthony Austin, male    DOB: August 17, 1960  Age: 54 y.o. MRN: [id number]  CC: Hypertension   HPI Anthony Austin presents for  follow-up of hypertension. Patient has no history of headache chest pain or shortness of breath or recent cough. Patient also denies symptoms of TIA such as numbness weakness lateralizing. Patient checks  blood pressure at home and has not had any elevated readings recently. Patient denies side effects from his medication. States taking it regularly.   History Anthony Austin has a past medical history of Hyperlipidemia and Hypertension.   He has past surgical history that includes Prostate surgery.   His family history includes Cancer in his father; Diabetes in his mother; Hypertension in his father and mother; Stroke in his mother.He reports that he has never smoked. He does not have any smokeless tobacco history on file. He reports that he does not drink alcohol or use illicit drugs.  Current Outpatient Prescriptions on File Prior to Visit  Medication Sig Dispense Refill  . lisinopril-hydrochlorothiazide (PRINZIDE,ZESTORETIC) 20-25 MG per tablet Take 1 tablet by mouth daily. 90 tablet 4   No current facility-administered medications on file prior to visit.    ROS Review of Systems  Constitutional: Negative for fever, chills and diaphoresis.  HENT: Negative for congestion, rhinorrhea and sore throat.   Respiratory: Negative for cough, shortness of breath and wheezing.   Cardiovascular: Negative for chest pain.  Gastrointestinal: Negative for nausea, vomiting, abdominal pain, diarrhea, constipation and abdominal distention.  Genitourinary: Negative for dysuria and frequency.  Musculoskeletal: Negative for joint swelling and arthralgias.  Skin: Negative for rash.  Neurological: Negative for headaches.    Objective:  BP 126/81 mmHg  Pulse 66  Temp(Src) 97.7 F (36.5 C) (Oral)  Ht 5' 7.5" (1.715 m)  Wt 182 lb 6.4 oz (82.736 kg)  BMI 28.13  kg/m2  BP Readings from Last 3 Encounters:  03/20/15 126/81  09/20/14 127/78  09/06/13 120/80    Wt Readings from Last 3 Encounters:  03/20/15 182 lb 6.4 oz (82.736 kg)  09/20/14 188 lb (85.276 kg)  09/06/13 177 lb (80.287 kg)     Physical Exam  Constitutional: He appears well-developed and well-nourished.  HENT:  Head: Normocephalic and atraumatic.  Right Ear: Tympanic membrane and external ear normal. No decreased hearing is noted.  Left Ear: Tympanic membrane and external ear normal. No decreased hearing is noted.  Mouth/Throat: No oropharyngeal exudate or posterior oropharyngeal erythema.  Eyes: Pupils are equal, round, and reactive to light.  Neck: Normal range of motion. Neck supple.  Cardiovascular: Normal rate and regular rhythm.   No murmur heard. Pulmonary/Chest: Breath sounds normal. No respiratory distress.  Abdominal: Soft. Bowel sounds are normal. He exhibits no mass. There is no tenderness.  Vitals reviewed.   Lab Results  Component Value Date   HGBA1C 5.7 09/13/2012    Lab Results  Component Value Date   WBC 6.8 09/20/2014   HGB 15.3 09/20/2014   HCT 45.9 09/20/2014   PLT 216 10/16/2009   GLUCOSE 98 03/20/2015   CHOL 179 09/20/2014   TRIG 232* 09/20/2014   HDL 39* 09/20/2014   LDLCALC 94 09/20/2014   ALT 39 09/20/2014   AST 28 09/20/2014   NA 140 03/20/2015   K 4.4 03/20/2015   CL 96* 03/20/2015   CREATININE 0.93 03/20/2015   BUN 14 03/20/2015   CO2 30* 03/20/2015   TSH 1.170 08/09/2013   PSA 0.9 09/20/2014   HGBA1C 5.7  09/13/2012    US Scrotum  08/14/2013   CLINICAL DATA:  Left scrotal mass followup.  EXAM: SCROTAL ULTRASOUND  DOPPLER ULTRASOUND OF THE TESTICLES  TECHNIQUE: Complete ultrasound examination of the testicles, epididymis, and other scrotal structures was performed. Color and spectral Doppler ultrasound were also utilized to evaluate blood flow to the testicles.  COMPARISON:  US SCROTUM dated 02/16/2012  FINDINGS: Right testicle   Measurements: 4.3 x 2.7 x 2.6 cm . No mass or microlithiasis visualized.  Left testicle  Measurements: 4.2 x 2.6 x 3.1 cm . No mass or microlithiasis visualized. Within the inferior and superficial aspect of the left scrotal sac, a heterogeneous lesion measures approximately 1.5 x 1.2 x 0.9 cm. This likely corresponds to lesion of similar size and location on exam of 02/16/2012, measuring 1.6 x 1.2 x 0.9 cm. It has internal flow and is the site of palpable abnormality reported by the patient.  Right epididymis: Normal in size and appearance.  Left epididymis: Normal in size and appearance.  Hydrocele: Small bilateral.  Varicocele: Absent.  Pulsed Doppler interrogation of both testes demonstrates low resistance arterial and venous waveforms bilaterally.  IMPRESSION: 1. Heterogeneous lesion in the inferior and superficial aspect of the left scrotal sac, similar in size and appearance from 02/16/2012, favoring a benign lesion. 2. No evidence of an intratesticular mass or torsion. 3. Small bilateral hydroceles.   Electronically Signed   By: Lorin Picket M.D.   On: 08/14/2013 11:09   [place] Art/ven Flow Abd Pelv Doppler  08/14/2013   CLINICAL DATA:  Left scrotal mass followup.  EXAM: SCROTAL ULTRASOUND  DOPPLER ULTRASOUND OF THE TESTICLES  TECHNIQUE: Complete ultrasound examination of the testicles, epididymis, and other scrotal structures was performed. Color and spectral Doppler ultrasound were also utilized to evaluate blood flow to the testicles.  COMPARISON:  US SCROTUM dated 02/16/2012  FINDINGS: Right testicle  Measurements: 4.3 x 2.7 x 2.6 cm. No mass or microlithiasis visualized.  Left testicle  Measurements: 4.2 x 2.6 x 3.1 cm. No mass or microlithiasis visualized. Within the inferior and superficial aspect of the left scrotal sac, a heterogeneous lesion measures approximately 1.5 x 1.2 x 0.9 cm. This likely corresponds to lesion of similar size and location on exam of 02/16/2012, measuring 1.6 x 1.2 x 0.9  cm. It has internal flow and is the site of palpable abnormality reported by the patient.  Right epididymis:  Normal in size and appearance.  Left epididymis:  Normal in size and appearance.  Hydrocele:  Small bilateral.  Varicocele:  Absent.  Pulsed Doppler interrogation of both testes demonstrates low resistance arterial and venous waveforms bilaterally.  IMPRESSION: 1. Heterogeneous lesion in the inferior and superficial aspect of the left scrotal sac, similar in size and appearance from 02/16/2012, favoring a benign lesion. 2. No evidence of an intratesticular mass or torsion. 3. Small bilateral hydroceles.   Electronically Signed   By: Lorin Picket M.D.   On: 08/14/2013 09:43    Assessment & Plan:   Janet was seen today for hypertension.  Diagnoses and all orders for this visit:  Essential hypertension -     BMP8+EGFR  Vitamin D deficiency -     Vit D  25 hydroxy (rtn osteoporosis monitoring)  Other orders -     phentermine 37.5 MG capsule; Take 1 capsule (37.5 mg total) by mouth every morning.  I have discontinued Mr. Ricketson Vitamin D (Ergocalciferol). I am also having him start on phentermine. Additionally, I am having him  maintain his lisinopril-hydrochlorothiazide.  Meds ordered this encounter  Medications  . phentermine 37.5 MG capsule    Sig: Take 1 capsule (37.5 mg total) by mouth every morning.    Dispense:  30 capsule    Refill:  2     Follow-up: Return in about 6 months (around 09/17/2015) for CPE.  Claretta Fraise, M.D.

## 2015-03-21 LAB — BMP8+EGFR
BUN / CREAT RATIO: 15 (ref 9–20)
BUN: 14 mg/dL (ref 6–24)
CHLORIDE: 96 mmol/L — AB (ref 97–108)
CO2: 30 mmol/L — ABNORMAL HIGH (ref 18–29)
Calcium: 10.1 mg/dL (ref 8.7–10.2)
Creatinine, Ser: 0.93 mg/dL (ref 0.76–1.27)
GFR, EST AFRICAN AMERICAN: 107 mL/min/{1.73_m2} (ref >59)
GFR, EST NON AFRICAN AMERICAN: 93 mL/min/{1.73_m2} (ref >59)
Glucose: 98 mg/dL (ref 65–99)
Potassium: 4.4 mmol/L (ref 3.5–5.2)
Sodium: 140 mmol/L (ref 134–144)

## 2015-03-21 LAB — VITAMIN D 25 HYDROXY (VIT D DEFICIENCY, FRACTURES): Vit D, 25-Hydroxy: 30 ng/mL (ref 30.0–100.0)

## 2015-03-26 ENCOUNTER — Ambulatory Visit: Payer: BLUE CROSS/BLUE SHIELD | Admitting: Family Medicine

## 2015-09-16 ENCOUNTER — Other Ambulatory Visit: Payer: BLUE CROSS/BLUE SHIELD

## 2015-09-16 DIAGNOSIS — Z0000 Encounter for general adult medical examination without abnormal findings: Secondary | ICD-10-CM

## 2015-09-17 LAB — CBC WITH DIFFERENTIAL/PLATELET
BASOS ABS: 0 10*3/uL (ref 0.0–0.2)
Basos: 0 %
EOS (ABSOLUTE): 0.1 10*3/uL (ref 0.0–0.4)
Eos: 2 %
HEMATOCRIT: 44.4 % (ref 37.5–51.0)
Hemoglobin: 15 g/dL (ref 12.6–17.7)
Immature Grans (Abs): 0 10*3/uL (ref 0.0–0.1)
Immature Granulocytes: 0 %
LYMPHS ABS: 1.7 10*3/uL (ref 0.7–3.1)
Lymphs: 36 %
MCH: 31.9 pg (ref 26.6–33.0)
MCHC: 33.8 g/dL (ref 31.5–35.7)
MCV: 95 fL (ref 79–97)
Monocytes Absolute: 0.4 10*3/uL (ref 0.1–0.9)
Monocytes: 7 %
NEUTROS ABS: 2.6 10*3/uL (ref 1.4–7.0)
Neutrophils: 55 %
Platelets: 238 10*3/uL (ref 150–379)
RBC: 4.7 x10E6/uL (ref 4.14–5.80)
RDW: 13.8 % (ref 12.3–15.4)
WBC: 4.7 10*3/uL (ref 3.4–10.8)

## 2015-09-17 LAB — CMP14+EGFR
ALBUMIN: 4.7 g/dL (ref 3.5–5.5)
ALK PHOS: 58 IU/L (ref 39–117)
ALT: 51 IU/L — ABNORMAL HIGH (ref 0–44)
AST: 31 IU/L (ref 0–40)
Albumin/Globulin Ratio: 2.6 — ABNORMAL HIGH (ref 1.2–2.2)
BILIRUBIN TOTAL: 0.6 mg/dL (ref 0.0–1.2)
BUN/Creatinine Ratio: 21 — ABNORMAL HIGH (ref 9–20)
BUN: 22 mg/dL (ref 6–24)
CHLORIDE: 100 mmol/L (ref 96–106)
CO2: 26 mmol/L (ref 18–29)
Calcium: 9.2 mg/dL (ref 8.7–10.2)
Creatinine, Ser: 1.07 mg/dL (ref 0.76–1.27)
GFR calc Af Amer: 90 mL/min/{1.73_m2} (ref >59)
GFR calc non Af Amer: 78 mL/min/{1.73_m2} (ref >59)
GLOBULIN, TOTAL: 1.8 g/dL (ref 1.5–4.5)
GLUCOSE: 103 mg/dL — AB (ref 65–99)
Potassium: 3.9 mmol/L (ref 3.5–5.2)
Sodium: 143 mmol/L (ref 134–144)
Total Protein: 6.5 g/dL (ref 6.0–8.5)

## 2015-09-17 LAB — LIPID PANEL
Chol/HDL Ratio: 4.6 ratio units (ref 0.0–5.0)
Cholesterol, Total: 172 mg/dL (ref 100–199)
HDL: 37 mg/dL — ABNORMAL LOW (ref >39)
LDL Calculated: 101 mg/dL — ABNORMAL HIGH (ref 0–99)
Triglycerides: 169 mg/dL — ABNORMAL HIGH (ref 0–149)
VLDL Cholesterol Cal: 34 mg/dL (ref 5–40)

## 2015-09-17 LAB — PSA, TOTAL AND FREE
PROSTATE SPECIFIC AG, SERUM: 0.9 ng/mL (ref 0.0–4.0)
PSA FREE: 0.02 ng/mL
PSA, Free Pct: 2.2 %

## 2015-09-17 LAB — TSH: TSH: 2.18 u[IU]/mL (ref 0.450–4.500)

## 2015-09-20 ENCOUNTER — Encounter: Payer: Self-pay | Admitting: Family Medicine

## 2015-09-20 ENCOUNTER — Ambulatory Visit (INDEPENDENT_AMBULATORY_CARE_PROVIDER_SITE_OTHER): Payer: BLUE CROSS/BLUE SHIELD | Admitting: Family Medicine

## 2015-09-20 VITALS — BP 125/82 | HR 73 | Temp 98.1°F | Ht 67.5 in | Wt 187.8 lb

## 2015-09-20 DIAGNOSIS — Z0000 Encounter for general adult medical examination without abnormal findings: Secondary | ICD-10-CM

## 2015-09-20 DIAGNOSIS — R7303 Prediabetes: Secondary | ICD-10-CM | POA: Insufficient documentation

## 2015-09-20 DIAGNOSIS — R7309 Other abnormal glucose: Secondary | ICD-10-CM

## 2015-09-20 DIAGNOSIS — I10 Essential (primary) hypertension: Secondary | ICD-10-CM

## 2015-09-20 DIAGNOSIS — R202 Paresthesia of skin: Secondary | ICD-10-CM

## 2015-09-20 DIAGNOSIS — IMO0001 Reserved for inherently not codable concepts without codable children: Secondary | ICD-10-CM

## 2015-09-20 MED ORDER — PHENTERMINE HCL 37.5 MG PO CAPS: 37.5000 mg | ORAL_CAPSULE | Freq: Two times a day (BID) | ORAL | Status: DC

## 2015-09-20 NOTE — Addendum Note (Signed)
Addended by: Claretta Fraise on: 09/20/2015 10:33 AM   Modules accepted: Orders, SmartSet

## 2015-09-20 NOTE — Progress Notes (Addendum)
Subjective:  Patient ID: Anthony Austin, male    DOB: Oct 22, 1960  Age: 55 y.o. MRN: [id number]  CC: Annual Exam   HPI Anthony Austin presents for CPE - no rectal due to prostatectomy.    follow-up of hypertension. Patient has no history of headache chest pain or shortness of breath or recent cough. Patient also denies symptoms of TIA such as numbness weakness lateralizing. Patient checks  blood pressure at home and has not had any elevated readings recently. Patient denies side effects from his medication. States taking it regularly. Patient would also like to take phentermine again. He backed off of it after a couple months last fall. He is ready to resume.  Increasing numbness in the toes for a year. Started with R 1st toe & now involves both forefeet. Feels like a sock is wadded up under toes.Mildly painful. He is on his feet constantly. Wears various shoes through the day none seem to affect him one way or the other.   History Anthony Austin has a past medical history of Hyperlipidemia and Hypertension.   He has past surgical history that includes Prostate surgery.   His family history includes Cancer in his father; Diabetes in his mother; Hypertension in his father and mother; Stroke in his mother.He reports that he has never smoked. He does not have any smokeless tobacco history on file. He reports that he does not drink alcohol or use illicit drugs.    ROS Review of Systems  Constitutional: Negative for fever, chills, diaphoresis, activity change, appetite change, fatigue and unexpected weight change.  HENT: Negative for congestion, ear pain, hearing loss, postnasal drip, rhinorrhea, sore throat, tinnitus and trouble swallowing.   Eyes: Negative for photophobia, pain, discharge and redness.  Respiratory: Negative for apnea, cough, choking, chest tightness, shortness of breath, wheezing and stridor.   Cardiovascular: Negative for chest pain, palpitations and leg swelling.  Gastrointestinal:  Negative for nausea, vomiting, abdominal pain, diarrhea, constipation, blood in stool and abdominal distention.  Endocrine: Negative for cold intolerance, heat intolerance, polydipsia, polyphagia and polyuria.  Genitourinary: Negative for dysuria, urgency, frequency, hematuria, flank pain, enuresis, difficulty urinating and genital sores.  Musculoskeletal: Negative for joint swelling and arthralgias.  Skin: Negative for color change, rash and wound.  Allergic/Immunologic: Negative for immunocompromised state.  Neurological: Negative for dizziness, tremors, seizures, syncope, facial asymmetry, speech difficulty, weakness, light-headedness, numbness and headaches.  Hematological: Does not bruise/bleed easily.  Psychiatric/Behavioral: Negative for suicidal ideas, hallucinations, behavioral problems, confusion, sleep disturbance, dysphoric mood, decreased concentration and agitation. The patient is not nervous/anxious and is not hyperactive.     Objective:  BP 125/82 mmHg  Pulse 73  Temp(Src) 98.1 F (36.7 C) (Oral)  Ht 5' 7.5" (1.715 m)  Wt 187 lb 12.8 oz (85.186 kg)  BMI 28.96 kg/m2  SpO2 97%  BP Readings from Last 3 Encounters:  09/20/15 125/82  03/20/15 126/81  09/20/14 127/78    Wt Readings from Last 3 Encounters:  09/20/15 187 lb 12.8 oz (85.186 kg)  03/20/15 182 lb 6.4 oz (82.736 kg)  09/20/14 188 lb (85.276 kg)     Physical Exam  Constitutional: He is oriented to person, place, and time. He appears well-developed and well-nourished.  HENT:  Head: Normocephalic and atraumatic.  Mouth/Throat: Oropharynx is clear and moist.  Eyes: EOM are normal. Pupils are equal, round, and reactive to light.  Neck: Normal range of motion. No tracheal deviation present. No thyromegaly present.  Cardiovascular: Normal rate, regular rhythm and normal heart  sounds.  Exam reveals no gallop and no friction rub.   No murmur heard. Pulmonary/Chest: Breath sounds normal. He has no wheezes. He has  no rales.  Abdominal: Soft. He exhibits no mass. There is no tenderness.  Genitourinary: Rectum normal, prostate normal and penis normal. No penile tenderness.  Musculoskeletal: Normal range of motion. He exhibits no edema.  Neurological: He is alert and oriented to person, place, and time. He has normal reflexes. No cranial nerve deficit. He exhibits normal muscle tone.  Skin: Skin is warm and dry.  Psychiatric: He has a normal mood and affect.     Lab Results  Component Value Date   WBC 4.7 09/16/2015   HGB 15.3 09/20/2014   HCT 44.4 09/16/2015   PLT 238 09/16/2015   GLUCOSE 103* 09/16/2015   CHOL 172 09/16/2015   TRIG 169* 09/16/2015   HDL 37* 09/16/2015   LDLCALC 101* 09/16/2015   ALT 51* 09/16/2015   AST 31 09/16/2015   NA 143 09/16/2015   K 3.9 09/16/2015   CL 100 09/16/2015   CREATININE 1.07 09/16/2015   BUN 22 09/16/2015   CO2 26 09/16/2015   TSH 2.180 09/16/2015   PSA 0.9 09/20/2014   HGBA1C 5.7 09/13/2012     Assessment & Plan:   Anthony Austin was seen today for annual exam.  Diagnoses and all orders for this visit:  Well adult  Essential hypertension  Elevated glucose -     Hemoglobin A1c  Other orders -     phentermine 37.5 MG capsule; Take 1 capsule (37.5 mg total) by mouth 2 (two) times daily with breakfast and lunch.    I have changed Anthony Austin phentermine. I am also having him maintain his lisinopril-hydrochlorothiazide.  Meds ordered this encounter  Medications  . phentermine 37.5 MG capsule    Sig: Take 1 capsule (37.5 mg total) by mouth 2 (two) times daily with breakfast and lunch.    Dispense:  60 capsule    Refill:  2   For the toes we discussed change of footwear and we'll check a B12 level. Additionally his glucose was borderline so will add an A1c.  Follow-up: Return in about 6 months (around 03/21/2016) for hypertension.  Claretta Fraise, M.D.

## 2015-09-20 NOTE — Addendum Note (Signed)
Addended by: Marin Olp on: 09/20/2015 12:05 PM   Modules accepted: Orders, SmartSet

## 2015-09-20 NOTE — Patient Instructions (Signed)
Be sure to get shoes for work that include a crepe sole, wide toe box, and a good supportive leather upper. We will these to reduce the numbness in the toes.

## 2015-09-21 LAB — HGB A1C W/O EAG: HEMOGLOBIN A1C: 6 % — AB (ref 4.8–5.6)

## 2015-09-21 LAB — VITAMIN B12: VITAMIN B 12: 656 pg/mL (ref 211–946)

## 2015-09-21 LAB — SPECIMEN STATUS REPORT

## 2015-09-24 LAB — FECAL OCCULT BLOOD, IMMUNOCHEMICAL: Fecal Occult Bld: NEGATIVE

## 2015-10-11 IMAGING — US US ART/VEN ABD/PELV/SCROTUM DOPPLER LTD
1 series · 13 of 25 positions shown · non-contrast
Comparison: US SCROTUM dated 02/16/2012

CLINICAL DATA: Left scrotal mass followup.

EXAM:
SCROTAL ULTRASOUND
DOPPLER ULTRASOUND OF THE TESTICLES
TECHNIQUE: Complete ultrasound examination of the testicles, epididymis, and
other scrotal structures was performed. Color and spectral Doppler
ultrasound were also utilized to evaluate blood flow to the
testicles.

[Series 1: us art/ven abd/pelv/scrotum doppler ltd · 0.07mm/px · 13 of 71 slices shown]
[im 1/71]
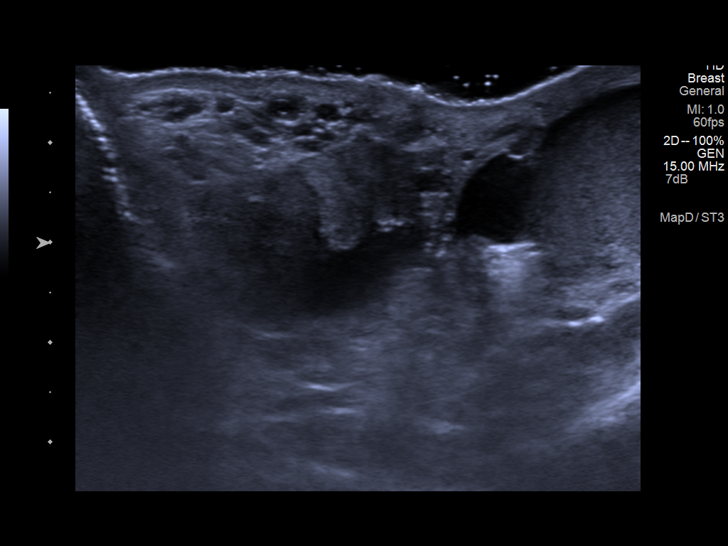
[im 6/71]
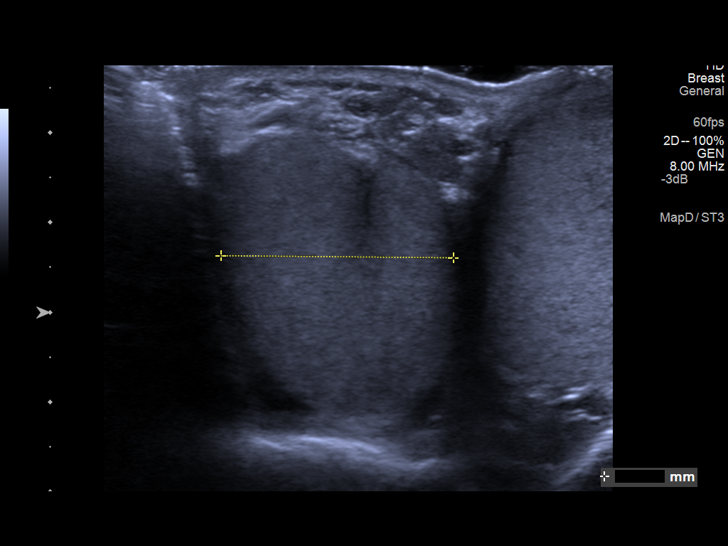
[im 12/71]
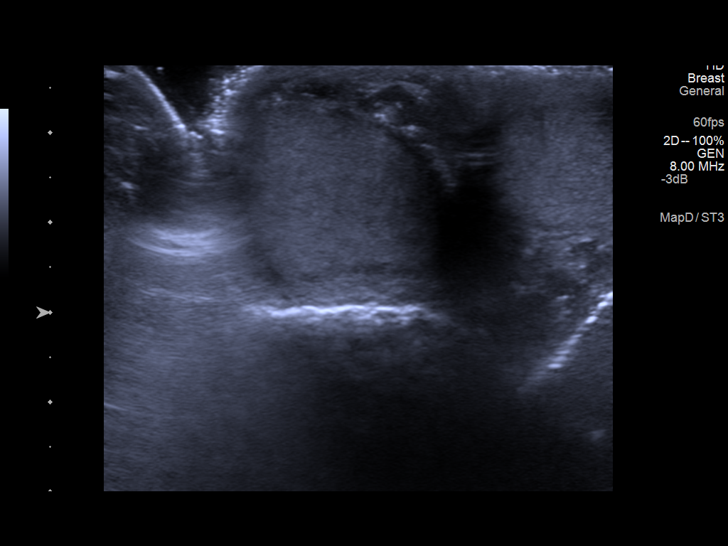
[im 18/71]
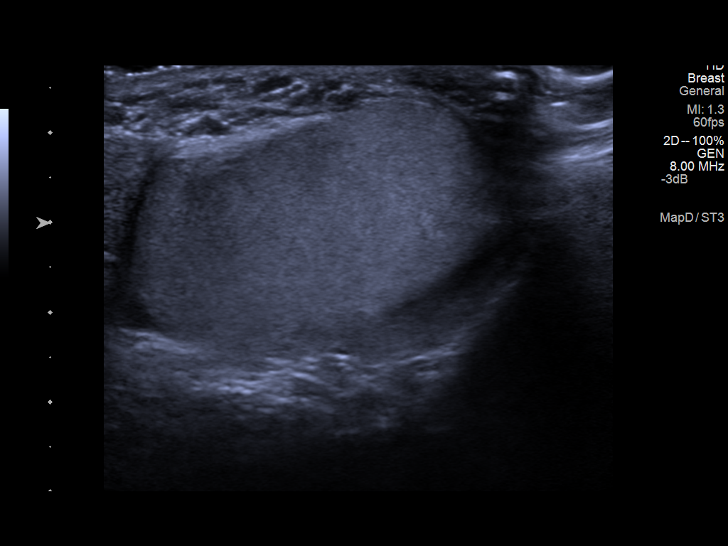
[im 24/71]
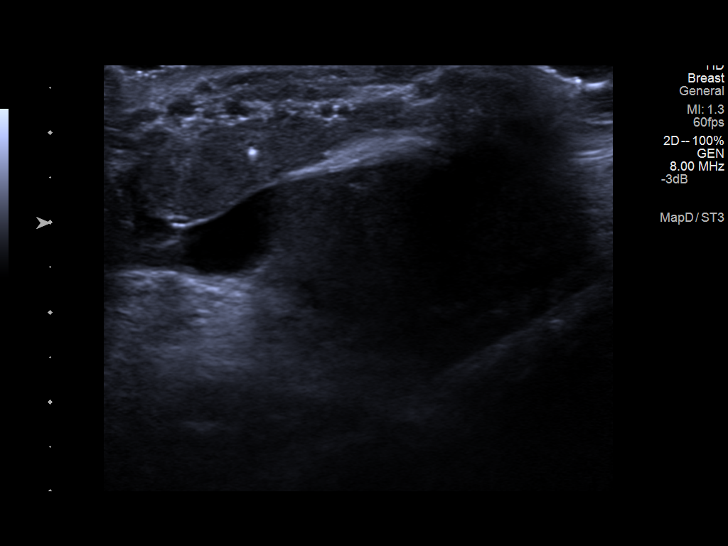
[im 30/71]
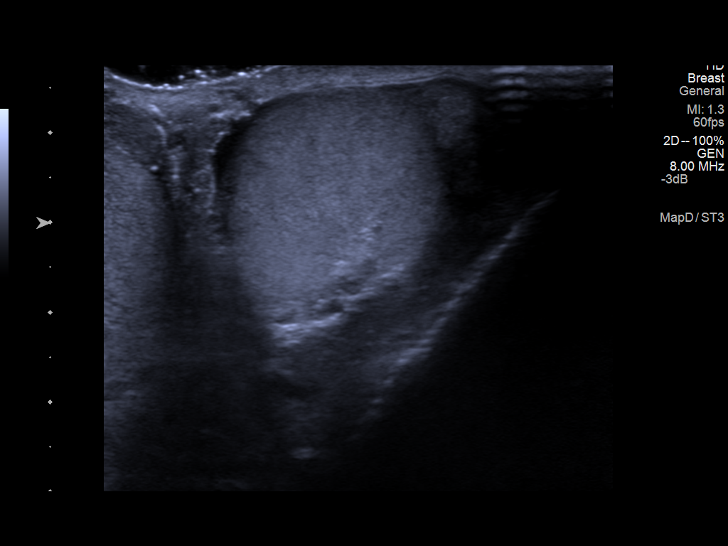
[im 36/71]
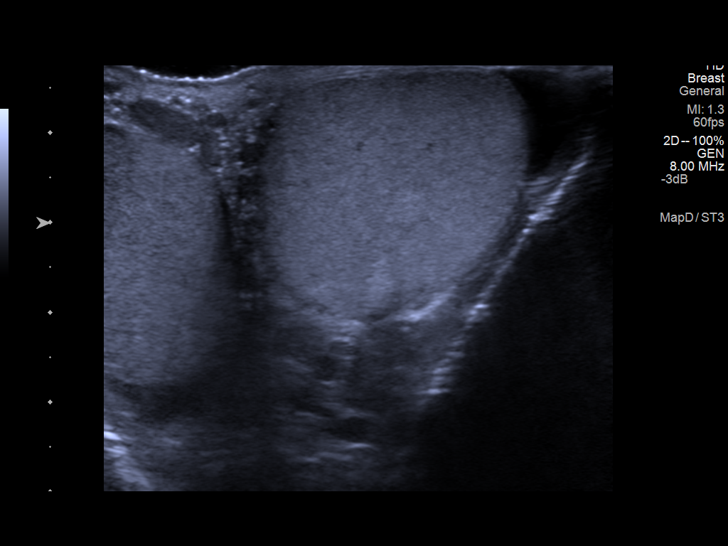
[im 41/71]
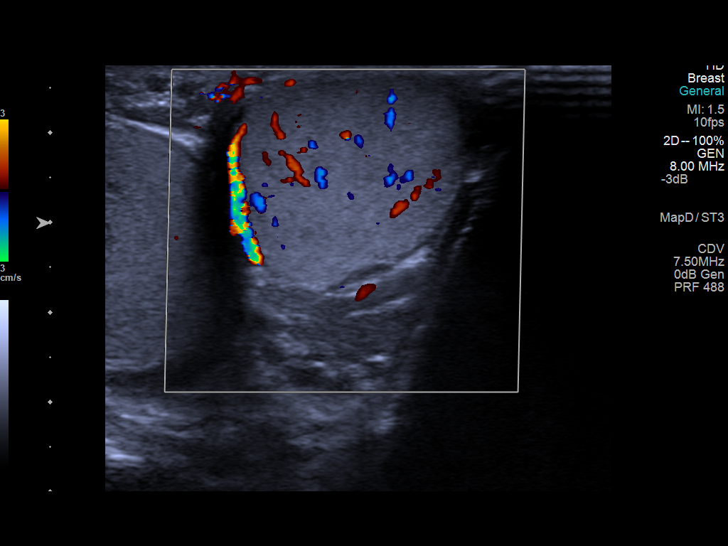
[im 47/71]
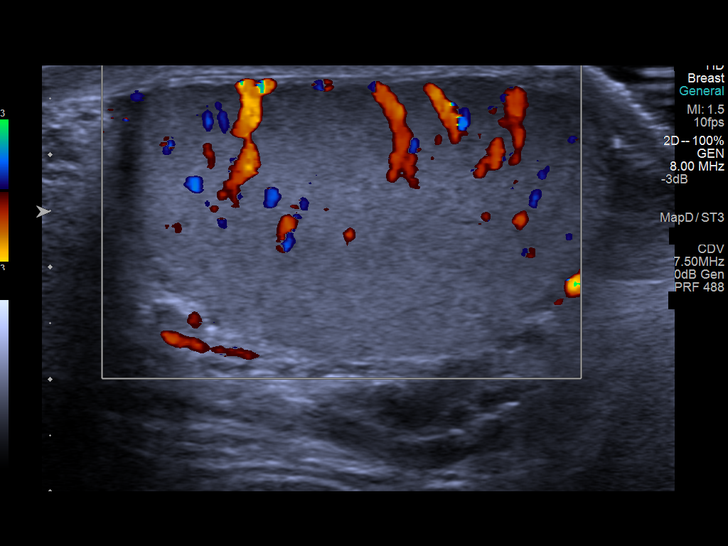
[im 53/71]
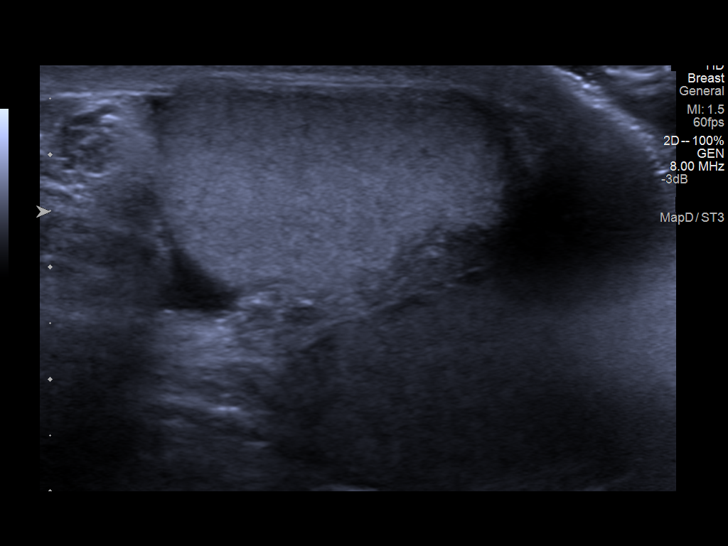
[im 59/71]
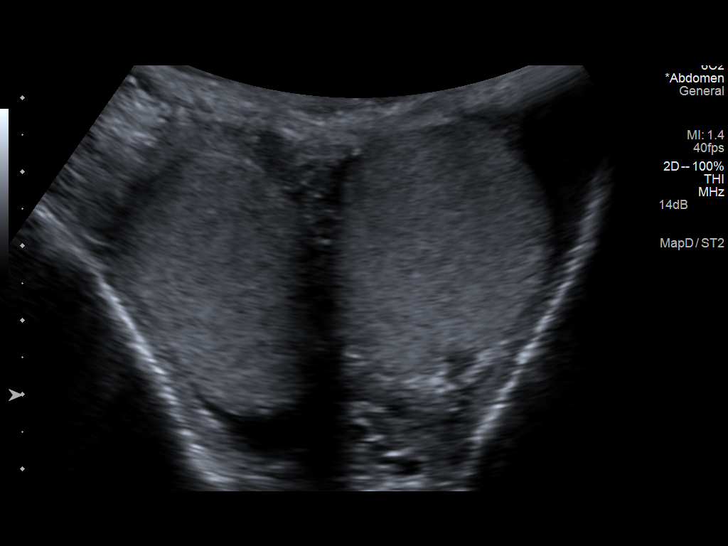
[im 65/71]
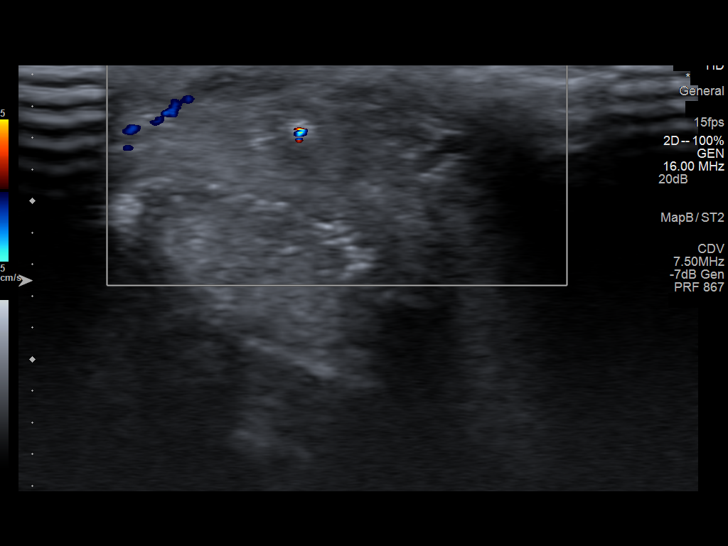
[im 71/71]
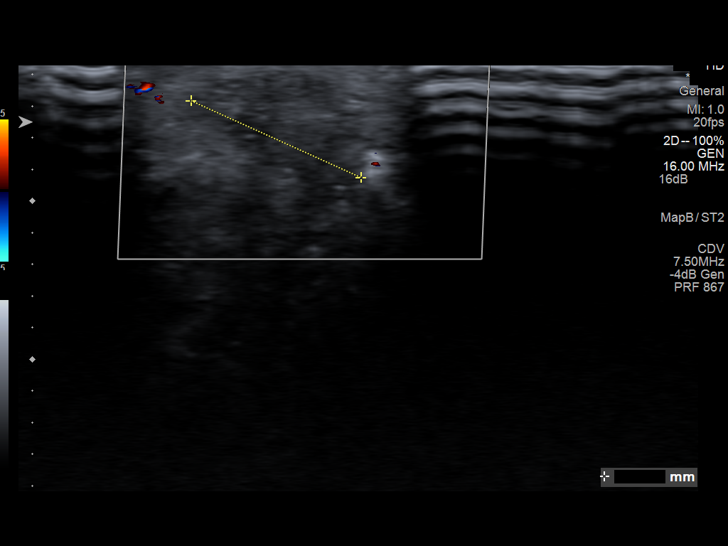

[13 of 25 positions shown; findings below may reference images not displayed]

FINDINGS: Right testicle

Measurements: 4.3 x 2.7 x 2.6 cm. No mass or microlithiasis
visualized.

Left testicle

Measurements: 4.2 x 2.6 x 3.1 cm. No mass or microlithiasis
visualized. Within the inferior and superficial aspect of the left
scrotal sac, a heterogeneous lesion measures approximately 1.5 x
x 0.9 cm. This likely corresponds to lesion of similar size and
location on exam of 02/16/2012, measuring 1.6 x 1.2 x 0.9 cm. It has
internal flow and is the site of palpable abnormality reported by
the patient.

Right epididymis:  Normal in size and appearance.

Left epididymis:  Normal in size and appearance.

Hydrocele:  Small bilateral.

Varicocele:  Absent.

Pulsed Doppler interrogation of both testes demonstrates low
resistance arterial and venous waveforms bilaterally.
IMPRESSION: 1. Heterogeneous lesion in the inferior and superficial aspect of
the left scrotal sac, similar in size and appearance from
02/16/2012, favoring a benign lesion.
2. No evidence of an intratesticular mass or torsion.
3. Small bilateral hydroceles.

## 2015-10-30 ENCOUNTER — Other Ambulatory Visit: Payer: Self-pay | Admitting: Family Medicine

## 2015-12-19 DIAGNOSIS — M5137 Other intervertebral disc degeneration, lumbosacral region: Secondary | ICD-10-CM | POA: Diagnosis not present

## 2015-12-19 DIAGNOSIS — M9905 Segmental and somatic dysfunction of pelvic region: Secondary | ICD-10-CM | POA: Diagnosis not present

## 2015-12-30 DIAGNOSIS — M5137 Other intervertebral disc degeneration, lumbosacral region: Secondary | ICD-10-CM | POA: Diagnosis not present

## 2015-12-30 DIAGNOSIS — M9905 Segmental and somatic dysfunction of pelvic region: Secondary | ICD-10-CM | POA: Diagnosis not present

## 2016-01-01 DIAGNOSIS — M5137 Other intervertebral disc degeneration, lumbosacral region: Secondary | ICD-10-CM | POA: Diagnosis not present

## 2016-01-01 DIAGNOSIS — M9905 Segmental and somatic dysfunction of pelvic region: Secondary | ICD-10-CM | POA: Diagnosis not present

## 2016-01-02 DIAGNOSIS — M9905 Segmental and somatic dysfunction of pelvic region: Secondary | ICD-10-CM | POA: Diagnosis not present

## 2016-01-02 DIAGNOSIS — M5137 Other intervertebral disc degeneration, lumbosacral region: Secondary | ICD-10-CM | POA: Diagnosis not present

## 2016-01-06 DIAGNOSIS — M5137 Other intervertebral disc degeneration, lumbosacral region: Secondary | ICD-10-CM | POA: Diagnosis not present

## 2016-01-06 DIAGNOSIS — M9905 Segmental and somatic dysfunction of pelvic region: Secondary | ICD-10-CM | POA: Diagnosis not present

## 2016-01-08 DIAGNOSIS — M9905 Segmental and somatic dysfunction of pelvic region: Secondary | ICD-10-CM | POA: Diagnosis not present

## 2016-01-08 DIAGNOSIS — M5137 Other intervertebral disc degeneration, lumbosacral region: Secondary | ICD-10-CM | POA: Diagnosis not present

## 2016-01-09 DIAGNOSIS — M5137 Other intervertebral disc degeneration, lumbosacral region: Secondary | ICD-10-CM | POA: Diagnosis not present

## 2016-01-09 DIAGNOSIS — M9905 Segmental and somatic dysfunction of pelvic region: Secondary | ICD-10-CM | POA: Diagnosis not present

## 2016-01-20 DIAGNOSIS — M9905 Segmental and somatic dysfunction of pelvic region: Secondary | ICD-10-CM | POA: Diagnosis not present

## 2016-01-20 DIAGNOSIS — M5137 Other intervertebral disc degeneration, lumbosacral region: Secondary | ICD-10-CM | POA: Diagnosis not present

## 2016-01-22 DIAGNOSIS — M5137 Other intervertebral disc degeneration, lumbosacral region: Secondary | ICD-10-CM | POA: Diagnosis not present

## 2016-01-22 DIAGNOSIS — M9905 Segmental and somatic dysfunction of pelvic region: Secondary | ICD-10-CM | POA: Diagnosis not present

## 2016-01-23 DIAGNOSIS — M5137 Other intervertebral disc degeneration, lumbosacral region: Secondary | ICD-10-CM | POA: Diagnosis not present

## 2016-01-23 DIAGNOSIS — M9905 Segmental and somatic dysfunction of pelvic region: Secondary | ICD-10-CM | POA: Diagnosis not present

## 2016-01-27 DIAGNOSIS — M5137 Other intervertebral disc degeneration, lumbosacral region: Secondary | ICD-10-CM | POA: Diagnosis not present

## 2016-01-27 DIAGNOSIS — M9905 Segmental and somatic dysfunction of pelvic region: Secondary | ICD-10-CM | POA: Diagnosis not present

## 2016-01-29 DIAGNOSIS — M9905 Segmental and somatic dysfunction of pelvic region: Secondary | ICD-10-CM | POA: Diagnosis not present

## 2016-01-29 DIAGNOSIS — M5137 Other intervertebral disc degeneration, lumbosacral region: Secondary | ICD-10-CM | POA: Diagnosis not present

## 2016-01-30 DIAGNOSIS — M5137 Other intervertebral disc degeneration, lumbosacral region: Secondary | ICD-10-CM | POA: Diagnosis not present

## 2016-01-30 DIAGNOSIS — M9905 Segmental and somatic dysfunction of pelvic region: Secondary | ICD-10-CM | POA: Diagnosis not present

## 2016-02-03 DIAGNOSIS — M9905 Segmental and somatic dysfunction of pelvic region: Secondary | ICD-10-CM | POA: Diagnosis not present

## 2016-02-03 DIAGNOSIS — M5137 Other intervertebral disc degeneration, lumbosacral region: Secondary | ICD-10-CM | POA: Diagnosis not present

## 2016-02-10 DIAGNOSIS — M5137 Other intervertebral disc degeneration, lumbosacral region: Secondary | ICD-10-CM | POA: Diagnosis not present

## 2016-02-10 DIAGNOSIS — M9905 Segmental and somatic dysfunction of pelvic region: Secondary | ICD-10-CM | POA: Diagnosis not present

## 2016-02-26 DIAGNOSIS — M9905 Segmental and somatic dysfunction of pelvic region: Secondary | ICD-10-CM | POA: Diagnosis not present

## 2016-02-26 DIAGNOSIS — M5137 Other intervertebral disc degeneration, lumbosacral region: Secondary | ICD-10-CM | POA: Diagnosis not present

## 2016-03-02 DIAGNOSIS — M9905 Segmental and somatic dysfunction of pelvic region: Secondary | ICD-10-CM | POA: Diagnosis not present

## 2016-03-02 DIAGNOSIS — M5137 Other intervertebral disc degeneration, lumbosacral region: Secondary | ICD-10-CM | POA: Diagnosis not present

## 2016-03-09 DIAGNOSIS — M9905 Segmental and somatic dysfunction of pelvic region: Secondary | ICD-10-CM | POA: Diagnosis not present

## 2016-03-09 DIAGNOSIS — M5137 Other intervertebral disc degeneration, lumbosacral region: Secondary | ICD-10-CM | POA: Diagnosis not present

## 2016-03-12 DIAGNOSIS — M9905 Segmental and somatic dysfunction of pelvic region: Secondary | ICD-10-CM | POA: Diagnosis not present

## 2016-03-12 DIAGNOSIS — M5137 Other intervertebral disc degeneration, lumbosacral region: Secondary | ICD-10-CM | POA: Diagnosis not present

## 2016-03-18 DIAGNOSIS — M5137 Other intervertebral disc degeneration, lumbosacral region: Secondary | ICD-10-CM | POA: Diagnosis not present

## 2016-03-18 DIAGNOSIS — M9905 Segmental and somatic dysfunction of pelvic region: Secondary | ICD-10-CM | POA: Diagnosis not present

## 2016-03-19 DIAGNOSIS — M9905 Segmental and somatic dysfunction of pelvic region: Secondary | ICD-10-CM | POA: Diagnosis not present

## 2016-03-19 DIAGNOSIS — M5137 Other intervertebral disc degeneration, lumbosacral region: Secondary | ICD-10-CM | POA: Diagnosis not present

## 2016-03-30 DIAGNOSIS — M5137 Other intervertebral disc degeneration, lumbosacral region: Secondary | ICD-10-CM | POA: Diagnosis not present

## 2016-03-30 DIAGNOSIS — M9905 Segmental and somatic dysfunction of pelvic region: Secondary | ICD-10-CM | POA: Diagnosis not present

## 2016-04-13 DIAGNOSIS — M9905 Segmental and somatic dysfunction of pelvic region: Secondary | ICD-10-CM | POA: Diagnosis not present

## 2016-04-13 DIAGNOSIS — M5137 Other intervertebral disc degeneration, lumbosacral region: Secondary | ICD-10-CM | POA: Diagnosis not present

## 2016-04-22 DIAGNOSIS — M5137 Other intervertebral disc degeneration, lumbosacral region: Secondary | ICD-10-CM | POA: Diagnosis not present

## 2016-04-22 DIAGNOSIS — M9905 Segmental and somatic dysfunction of pelvic region: Secondary | ICD-10-CM | POA: Diagnosis not present

## 2016-04-28 DIAGNOSIS — M9905 Segmental and somatic dysfunction of pelvic region: Secondary | ICD-10-CM | POA: Diagnosis not present

## 2016-04-28 DIAGNOSIS — M5137 Other intervertebral disc degeneration, lumbosacral region: Secondary | ICD-10-CM | POA: Diagnosis not present

## 2016-09-02 ENCOUNTER — Ambulatory Visit (INDEPENDENT_AMBULATORY_CARE_PROVIDER_SITE_OTHER): Payer: BLUE CROSS/BLUE SHIELD | Admitting: Family Medicine

## 2016-09-02 ENCOUNTER — Encounter: Payer: Self-pay | Admitting: Family Medicine

## 2016-09-02 VITALS — BP 129/74 | HR 72 | Temp 98.5°F | Ht 67.5 in | Wt 193.0 lb

## 2016-09-02 DIAGNOSIS — R7303 Prediabetes: Secondary | ICD-10-CM

## 2016-09-02 DIAGNOSIS — R079 Chest pain, unspecified: Secondary | ICD-10-CM

## 2016-09-02 DIAGNOSIS — I739 Peripheral vascular disease, unspecified: Secondary | ICD-10-CM

## 2016-09-02 DIAGNOSIS — I10 Essential (primary) hypertension: Secondary | ICD-10-CM

## 2016-09-02 LAB — BAYER DCA HB A1C WAIVED: HB A1C: 6.2 % (ref <7.0)

## 2016-09-02 MED ORDER — LISINOPRIL 40 MG PO TABS: 40.0000 mg | ORAL_TABLET | Freq: Every day | ORAL | 3 refills | Status: DC

## 2016-09-02 MED ORDER — HYDROCHLOROTHIAZIDE 25 MG PO TABS: 25.0000 mg | ORAL_TABLET | Freq: Every day | ORAL | 3 refills | Status: DC

## 2016-09-02 NOTE — Progress Notes (Signed)
 Subjective:  Patient ID: Anthony Austin, male    DOB: 11/01/1960  Age: 56 y.o. MRN: [id number]  CC: Chest Pain (pt had an episode of chest tightness on Monday )   HPI Anthony Austin presents for Substernal chest pain occurred once 2 days ago. It was described as described as a tightness across the anterior chest. There was no radiation. It was not associated with any diaphoresis. There was some lightheadedness that associated with it. Of note was that his blood pressure was 155/110 area last night it was back down to 133/93. The episode lasted about 6 minutes at home. It's occurred 1 time only. Of note is that his family history for cardiovascular disease is positive in that his mother had a cerebrovascular accident at approximately his age. He has had no focal neurologic changes.  Cardiac risk factors include age, gender, hypertension, prediabetes, overweight and family history. Negative factors are diabetes and smoking History Baldo has a past medical history of Hyperlipidemia and Hypertension.   He has a past surgical history that includes Prostate surgery.   His family history includes Cancer in his father; Diabetes in his mother; Hypertension in his father and mother; Stroke in his mother.He reports that he has never smoked. He has never used smokeless tobacco. He reports that he does not drink alcohol or use drugs.    ROS Review of Systems  Constitutional: Negative for chills, diaphoresis, fever and unexpected weight change.  HENT: Negative for congestion, hearing loss, rhinorrhea and sore throat.   Eyes: Negative for visual disturbance.  Respiratory: Negative for cough and shortness of breath.   Cardiovascular: Positive for chest pain. Negative for palpitations.  Gastrointestinal: Negative for abdominal pain, constipation and diarrhea.  Genitourinary: Negative for dysuria and flank pain.  Musculoskeletal: Negative for arthralgias and joint swelling.  Skin: Negative for rash.    Neurological: Negative for dizziness and headaches.  Psychiatric/Behavioral: Negative for dysphoric mood and sleep disturbance.    Objective:  BP 129/74   Pulse 72   Temp 98.5 F (36.9 C) (Oral)   Ht 5' 7.5" (1.715 m)   Wt 193 lb (87.5 kg)   BMI 29.78 kg/m   BP Readings from Last 3 Encounters:  09/02/16 129/74  09/20/15 125/82  03/20/15 126/81    Wt Readings from Last 3 Encounters:  09/02/16 193 lb (87.5 kg)  09/20/15 187 lb 12.8 oz (85.2 kg)  03/20/15 182 lb 6.4 oz (82.7 kg)     Physical Exam  Constitutional: He is oriented to person, place, and time. He appears well-developed and well-nourished. No distress.  HENT:  Head: Normocephalic and atraumatic.  Right Ear: External ear normal.  Left Ear: External ear normal.  Nose: Nose normal.  Mouth/Throat: Oropharynx is clear and moist.  Eyes: Conjunctivae and EOM are normal. Pupils are equal, round, and reactive to light.  Neck: Normal range of motion. Neck supple. No thyromegaly present.  Cardiovascular: Normal rate, regular rhythm and normal heart sounds.   No murmur heard. Pulmonary/Chest: Effort normal and breath sounds normal. No respiratory distress. He has no wheezes. He has no rales.  Abdominal: Soft. Bowel sounds are normal. He exhibits no distension. There is no tenderness.  Lymphadenopathy:    He has no cervical adenopathy.  Neurological: He is alert and oriented to person, place, and time. He has normal reflexes.  Skin: Skin is warm and dry.  Psychiatric: He has a normal mood and affect. His behavior is normal. Judgment and thought content normal.        Assessment & Plan:   Anthony Austin was seen today for chest pain.  Diagnoses and all orders for this visit:  Chest pain at rest -     EKG 12-Lead -     ECHOCARDIOGRAM STRESS TEST; Future -     CMP14+EGFR -     Lipid panel -     Hepatitis C antibody -     HIV antibody  Essential hypertension -     CMP14+EGFR -     Lipid panel -     Hepatitis C  antibody -     HIV antibody  Prediabetes -     Bayer DCA Hb A1c Waived  Claudication (HCC) -     VAS US LOWER EXTREMITY ARTERIAL DUPLEX; Future  Other orders -     lisinopril (PRINIVIL,ZESTRIL) 40 MG tablet; Take 1 tablet (40 mg total) by mouth daily. -     hydrochlorothiazide (HYDRODIURIL) 25 MG tablet; Take 1 tablet (25 mg total) by mouth daily.   I have discontinued Mr. Anthony Austin's phentermine and lisinopril-hydrochlorothiazide. I am also having him start on lisinopril and hydrochlorothiazide.  Allergies as of 09/02/2016   No Known Allergies     Medication List       Accurate as of 09/02/16 11:59 PM. Always use your most recent med list.          hydrochlorothiazide 25 MG tablet Commonly known as:  HYDRODIURIL Take 1 tablet (25 mg total) by mouth daily.   lisinopril 40 MG tablet Commonly known as:  PRINIVIL,ZESTRIL Take 1 tablet (40 mg total) by mouth daily.        Follow-up: Return in about 1 month (around 10/03/2016), or if symptoms worsen or fail to improve.  Warren Stacks, M.D. 

## 2016-09-03 LAB — CMP14+EGFR
ALBUMIN: 4.6 g/dL (ref 3.5–5.5)
ALT: 97 IU/L — AB (ref 0–44)
AST: 52 IU/L — ABNORMAL HIGH (ref 0–40)
Albumin/Globulin Ratio: 2.1 (ref 1.2–2.2)
Alkaline Phosphatase: 68 IU/L (ref 39–117)
BILIRUBIN TOTAL: 0.8 mg/dL (ref 0.0–1.2)
BUN / CREAT RATIO: 14 (ref 9–20)
BUN: 15 mg/dL (ref 6–24)
CALCIUM: 10 mg/dL (ref 8.7–10.2)
CO2: 32 mmol/L — ABNORMAL HIGH (ref 18–29)
CREATININE: 1.04 mg/dL (ref 0.76–1.27)
Chloride: 96 mmol/L (ref 96–106)
GFR, EST AFRICAN AMERICAN: 92 mL/min/{1.73_m2} (ref >59)
GFR, EST NON AFRICAN AMERICAN: 80 mL/min/{1.73_m2} (ref >59)
GLUCOSE: 102 mg/dL — AB (ref 65–99)
Globulin, Total: 2.2 g/dL (ref 1.5–4.5)
Potassium: 3.7 mmol/L (ref 3.5–5.2)
Sodium: 140 mmol/L (ref 134–144)
TOTAL PROTEIN: 6.8 g/dL (ref 6.0–8.5)

## 2016-09-03 LAB — LIPID PANEL
CHOL/HDL RATIO: 5.6 ratio — AB (ref 0.0–5.0)
Cholesterol, Total: 200 mg/dL — ABNORMAL HIGH (ref 100–199)
HDL: 36 mg/dL — AB (ref >39)
LDL CALC: 132 mg/dL — AB (ref 0–99)
Triglycerides: 159 mg/dL — ABNORMAL HIGH (ref 0–149)
VLDL CHOLESTEROL CAL: 32 mg/dL (ref 5–40)

## 2016-09-03 LAB — HEPATITIS C ANTIBODY

## 2016-09-03 LAB — HIV ANTIBODY (ROUTINE TESTING W REFLEX): HIV SCREEN 4TH GENERATION: NONREACTIVE

## 2016-09-04 NOTE — Progress Notes (Signed)
Pt aware of lab results. Will send to nurse to order CMP in 2-3 weeks.

## 2016-10-01 ENCOUNTER — Telehealth (HOSPITAL_COMMUNITY): Payer: Self-pay | Admitting: *Deleted

## 2016-10-01 ENCOUNTER — Telehealth: Payer: Self-pay | Admitting: Family Medicine

## 2016-10-01 NOTE — Telephone Encounter (Signed)
Left message on voicemail per DPR in reference to upcoming appointment scheduled on 10/07/16 at 2:30 with detailed instructions given per Stress Test Requisition Sheet for the test. LM to arrive 30 minutes early, and that it is imperative to arrive on time for appointment to keep from having the test rescheduled. If you need to cancel or reschedule your appointment, please call the office within 24 hours of your appointment. Failure to do so may result in a cancellation of your appointment, and a $50 no show fee. Phone number given for call back for any questions. Veronia Beets

## 2016-10-01 NOTE — Telephone Encounter (Signed)
Melinda at [facility] received an order from Dr. Livia Snellen on this patient for a Lower Arterial Extremity Duplex.  This cannot be scheduled until they have an order for an ABI (Image # [phone]).  Please call and let her know what you would like her to do.

## 2016-10-02 ENCOUNTER — Other Ambulatory Visit: Payer: BLUE CROSS/BLUE SHIELD

## 2016-10-02 DIAGNOSIS — R748 Abnormal levels of other serum enzymes: Secondary | ICD-10-CM

## 2016-10-03 LAB — CMP14+EGFR
A/G RATIO: 1.9 (ref 1.2–2.2)
ALT: 58 IU/L — AB (ref 0–44)
AST: 33 IU/L (ref 0–40)
Albumin: 4.5 g/dL (ref 3.5–5.5)
Alkaline Phosphatase: 67 IU/L (ref 39–117)
BUN/Creatinine Ratio: 21 — ABNORMAL HIGH (ref 9–20)
BUN: 28 mg/dL — ABNORMAL HIGH (ref 6–24)
Bilirubin Total: 0.4 mg/dL (ref 0.0–1.2)
CALCIUM: 9.6 mg/dL (ref 8.7–10.2)
CO2: 30 mmol/L — AB (ref 18–29)
CREATININE: 1.34 mg/dL — AB (ref 0.76–1.27)
Chloride: 97 mmol/L (ref 96–106)
GFR calc non Af Amer: 59 mL/min/{1.73_m2} — ABNORMAL LOW (ref >59)
GFR, EST AFRICAN AMERICAN: 68 mL/min/{1.73_m2} (ref >59)
Globulin, Total: 2.4 g/dL (ref 1.5–4.5)
Glucose: 100 mg/dL — ABNORMAL HIGH (ref 65–99)
POTASSIUM: 4.6 mmol/L (ref 3.5–5.2)
Sodium: 138 mmol/L (ref 134–144)
TOTAL PROTEIN: 6.9 g/dL (ref 6.0–8.5)

## 2016-10-04 ENCOUNTER — Other Ambulatory Visit: Payer: Self-pay | Admitting: Family Medicine

## 2016-10-04 DIAGNOSIS — I739 Peripheral vascular disease, unspecified: Secondary | ICD-10-CM

## 2016-10-04 MED ORDER — HYDROCHLOROTHIAZIDE 25 MG PO TABS: 12.5000 mg | ORAL_TABLET | Freq: Every day | ORAL | 3 refills | Status: DC

## 2016-10-04 NOTE — Telephone Encounter (Signed)
Test ordered as requested. However, may need to redirect to the facility at [facility] [address]. Thanks,  WS

## 2016-10-05 NOTE — Telephone Encounter (Signed)
Melinda at [facility] aware that order has been placed.  Rip Harbour will call to get patient scheduled.

## 2016-10-07 ENCOUNTER — Ambulatory Visit (HOSPITAL_COMMUNITY): Payer: BLUE CROSS/BLUE SHIELD

## 2016-10-07 ENCOUNTER — Ambulatory Visit (HOSPITAL_COMMUNITY): Payer: BLUE CROSS/BLUE SHIELD | Attending: Cardiovascular Disease

## 2016-10-07 DIAGNOSIS — R079 Chest pain, unspecified: Secondary | ICD-10-CM | POA: Insufficient documentation

## 2017-01-11 ENCOUNTER — Other Ambulatory Visit: Payer: Self-pay | Admitting: Family Medicine

## 2017-01-11 DIAGNOSIS — I739 Peripheral vascular disease, unspecified: Secondary | ICD-10-CM

## 2017-01-12 ENCOUNTER — Ambulatory Visit (HOSPITAL_COMMUNITY)
Admission: RE | Admit: 2017-01-12 | Discharge: 2017-01-12 | Disposition: A | Discharge status: Home / Self Care | Payer: BLUE CROSS/BLUE SHIELD | Source: Ambulatory Visit | Attending: Vascular Surgery | Admitting: Vascular Surgery

## 2017-01-12 DIAGNOSIS — I739 Peripheral vascular disease, unspecified: Secondary | ICD-10-CM

## 2017-05-21 DIAGNOSIS — H25811 Combined forms of age-related cataract, right eye: Secondary | ICD-10-CM | POA: Diagnosis not present

## 2017-05-24 DIAGNOSIS — H2513 Age-related nuclear cataract, bilateral: Secondary | ICD-10-CM | POA: Diagnosis not present

## 2017-05-24 DIAGNOSIS — H2511 Age-related nuclear cataract, right eye: Secondary | ICD-10-CM | POA: Diagnosis not present

## 2017-08-30 ENCOUNTER — Other Ambulatory Visit: Payer: Self-pay | Admitting: Family Medicine

## 2017-09-13 ENCOUNTER — Encounter: Payer: Self-pay | Admitting: Family Medicine

## 2017-09-13 ENCOUNTER — Ambulatory Visit (INDEPENDENT_AMBULATORY_CARE_PROVIDER_SITE_OTHER): Payer: BLUE CROSS/BLUE SHIELD | Admitting: Family Medicine

## 2017-09-13 VITALS — BP 119/74 | HR 64 | Temp 97.3°F | Ht 67.5 in | Wt 178.5 lb

## 2017-09-13 DIAGNOSIS — R7303 Prediabetes: Secondary | ICD-10-CM

## 2017-09-13 DIAGNOSIS — Z23 Encounter for immunization: Secondary | ICD-10-CM | POA: Diagnosis not present

## 2017-09-13 DIAGNOSIS — I10 Essential (primary) hypertension: Secondary | ICD-10-CM

## 2017-09-13 DIAGNOSIS — Z0000 Encounter for general adult medical examination without abnormal findings: Secondary | ICD-10-CM | POA: Diagnosis not present

## 2017-09-13 LAB — BAYER DCA HB A1C WAIVED: HB A1C: 5.3 % (ref <7.0)

## 2017-09-13 MED ORDER — LISINOPRIL 40 MG PO TABS: 40.0000 mg | ORAL_TABLET | Freq: Every day | ORAL | 3 refills | Status: DC

## 2017-09-13 MED ORDER — HYDROCHLOROTHIAZIDE 25 MG PO TABS: 12.5000 mg | ORAL_TABLET | Freq: Every day | ORAL | 3 refills | Status: DC

## 2017-09-13 NOTE — Progress Notes (Signed)
Subjective:  Patient ID: Anthony Austin, male    DOB: Oct 04, 1960  Age: 57 y.o. MRN: [id number]  CC: Annual Exam   HPI Anthony Austin presents for  Annual physical exam.  Last year when he was told he was a prediabetic he went on an exercise and diet regimen and has lost a net of 14 pounds.  He says however that he actually lost about 24 pounds but is gained several back.  He is on his way back down now by eating small frequent meals that are high in protein and low in carbs.  He is supplementing with protein shakes through the day.  He is also going to the gym and lifting weights and doing cardio several times a week.  His energy is good he feels very much improved from last year.  He is hoping that his A1c has normalized.  HE is also here for follow-up of hypertension. Patient has no history of headache chest pain or shortness of breath or recent cough. Patient also denies symptoms of TIA such as focal numbness or weakness.Patient denies side effects from medication. States taking it regularly.   History Anthony Austin has a past medical history of Hyperlipidemia, Hypertension, and Prostate CA ([place]) (2005).   He has a past surgical history that includes Prostate surgery; Prostatectomy (2005); and Prostatectomy.   His family history includes Cancer in his father; Diabetes in his mother; Hypertension in his father and mother; Stroke in his mother.He reports that he has never smoked. He has never used smokeless tobacco. He reports that he does not drink alcohol or use drugs.  No current outpatient medications on file prior to visit.   No current facility-administered medications on file prior to visit.     ROS Review of Systems  Constitutional: Negative for activity change, appetite change, chills, diaphoresis, fatigue, fever and unexpected weight change.  HENT: Negative for congestion, ear pain, hearing loss, postnasal drip, rhinorrhea, sore throat, tinnitus and trouble swallowing.   Eyes: Negative  for photophobia, pain, discharge and redness.  Respiratory: Negative for apnea, cough, choking, chest tightness, shortness of breath, wheezing and stridor.   Cardiovascular: Negative for chest pain, palpitations and leg swelling.  Gastrointestinal: Negative for abdominal distention, abdominal pain, blood in stool, constipation, diarrhea, nausea and vomiting.  Endocrine: Negative for cold intolerance, heat intolerance, polydipsia, polyphagia and polyuria.  Genitourinary: Negative for difficulty urinating, dysuria, enuresis, flank pain, frequency, genital sores, hematuria and urgency.  Musculoskeletal: Negative for arthralgias and joint swelling.  Skin: Negative for color change, rash and wound.  Allergic/Immunologic: Negative for immunocompromised state.  Neurological: Negative for dizziness, tremors, seizures, syncope, facial asymmetry, speech difficulty, weakness, light-headedness, numbness and headaches.  Hematological: Does not bruise/bleed easily.  Psychiatric/Behavioral: Negative for agitation, behavioral problems, confusion, decreased concentration, dysphoric mood, hallucinations, sleep disturbance and suicidal ideas. The patient is not nervous/anxious and is not hyperactive.     Objective:  BP 119/74   Pulse 64   Temp (!) 97.3 F (36.3 C) (Oral)   Ht 5' 7.5" (1.715 m)   Wt 178 lb 8 oz (81 kg)   BMI 27.54 kg/m   BP Readings from Last 3 Encounters:  09/13/17 119/74  09/02/16 129/74  09/20/15 125/82    Wt Readings from Last 3 Encounters:  09/13/17 178 lb 8 oz (81 kg)  09/02/16 193 lb (87.5 kg)  09/20/15 187 lb 12.8 oz (85.2 kg)     Physical Exam  Constitutional: He is oriented to person, place, and time.  He appears well-developed and well-nourished.  HENT:  Head: Normocephalic and atraumatic.  Mouth/Throat: Oropharynx is clear and moist.  Eyes: Pupils are equal, round, and reactive to light. EOM are normal.  Neck: Normal range of motion. No tracheal deviation present.  No thyromegaly present.  Cardiovascular: Normal rate, regular rhythm and normal heart sounds. Exam reveals no gallop and no friction rub.  No murmur heard. Pulmonary/Chest: Breath sounds normal. He has no wheezes. He has no rales.  Abdominal: Soft. He exhibits no mass. There is no tenderness.  Musculoskeletal: Normal range of motion. He exhibits no edema.  Neurological: He is alert and oriented to person, place, and time.  Skin: Skin is warm and dry.  Psychiatric: He has a normal mood and affect.      Assessment & Plan:   Anthony Austin was seen today for annual exam.  Diagnoses and all orders for this visit:  Well adult exam -     CBC with Differential/Platelet -     CMP14+EGFR -     Lipid panel -     PSA, total and free -     VITAMIN D 25 Hydroxy (Vit-D Deficiency, Fractures) -     Bayer DCA Hb A1c Waived -     Urinalysis  Essential hypertension -     CBC with Differential/Platelet -     CMP14+EGFR  Prediabetes -     Bayer DCA Hb A1c Waived -     Urinalysis  Other orders -     hydrochlorothiazide (HYDRODIURIL) 25 MG tablet; Take 0.5 tablets (12.5 mg total) by mouth daily. -     lisinopril (PRINIVIL,ZESTRIL) 40 MG tablet; Take 1 tablet (40 mg total) by mouth daily. -     Tdap vaccine greater than or equal to 7yo IM -     Hepatitis A vaccine adult IM -     Hepatitis B vaccine adult IM   Allergies as of 09/13/2017   No Known Allergies     Medication List        Accurate as of 09/13/17  3:54 PM. Always use your most recent med list.          hydrochlorothiazide 25 MG tablet Commonly known as:  HYDRODIURIL Take 0.5 tablets (12.5 mg total) by mouth daily.   lisinopril 40 MG tablet Commonly known as:  PRINIVIL,ZESTRIL Take 1 tablet (40 mg total) by mouth daily.       Meds ordered this encounter  Medications  . hydrochlorothiazide (HYDRODIURIL) 25 MG tablet    Sig: Take 0.5 tablets (12.5 mg total) by mouth daily.    Dispense:  90 tablet    Refill:  3  .  lisinopril (PRINIVIL,ZESTRIL) 40 MG tablet    Sig: Take 1 tablet (40 mg total) by mouth daily.    Dispense:  90 tablet    Refill:  3    Continue weight loss program.  Highly encouraged him to get to about 165 pounds.  For height, a little lower might be ideal considering his prediabetic condition. Follow-up: Return in about 1 year (around 09/14/2018) for Wellness, hypertension.  Claretta Fraise, M.D.

## 2017-09-14 LAB — CBC WITH DIFFERENTIAL/PLATELET
Basophils Absolute: 0 10*3/uL (ref 0.0–0.2)
Basos: 0 %
EOS (ABSOLUTE): 0.2 10*3/uL (ref 0.0–0.4)
Eos: 2 %
Hematocrit: 43.3 % (ref 37.5–51.0)
Hemoglobin: 15.6 g/dL (ref 13.0–17.7)
Immature Grans (Abs): 0 10*3/uL (ref 0.0–0.1)
Immature Granulocytes: 0 %
Lymphocytes Absolute: 1.8 10*3/uL (ref 0.7–3.1)
Lymphs: 27 %
MCH: 32.1 pg (ref 26.6–33.0)
MCHC: 36 g/dL — ABNORMAL HIGH (ref 31.5–35.7)
MCV: 89 fL (ref 79–97)
Monocytes Absolute: 0.4 10*3/uL (ref 0.1–0.9)
Monocytes: 6 %
Neutrophils Absolute: 4.2 10*3/uL (ref 1.4–7.0)
Neutrophils: 65 %
Platelets: 257 10*3/uL (ref 150–379)
RBC: 4.86 x10E6/uL (ref 4.14–5.80)
RDW: 13.7 % (ref 12.3–15.4)
WBC: 6.6 10*3/uL (ref 3.4–10.8)

## 2017-09-14 LAB — PSA, TOTAL AND FREE
PROSTATE SPECIFIC AG, SERUM: 1.1 ng/mL (ref 0.0–4.0)
PSA, Free Pct: 2.7 %
PSA, Free: 0.03 ng/mL

## 2017-09-14 LAB — CMP14+EGFR
ALT: 26 IU/L (ref 0–44)
AST: 24 IU/L (ref 0–40)
Albumin/Globulin Ratio: 2.4 — ABNORMAL HIGH (ref 1.2–2.2)
Albumin: 4.7 g/dL (ref 3.5–5.5)
Alkaline Phosphatase: 57 IU/L (ref 39–117)
BUN/Creatinine Ratio: 18 (ref 9–20)
BUN: 21 mg/dL (ref 6–24)
Bilirubin Total: 0.4 mg/dL (ref 0.0–1.2)
CO2: 25 mmol/L (ref 20–29)
Calcium: 9.5 mg/dL (ref 8.7–10.2)
Chloride: 102 mmol/L (ref 96–106)
Creatinine, Ser: 1.15 mg/dL (ref 0.76–1.27)
GFR calc Af Amer: 81 mL/min/{1.73_m2} (ref >59)
GFR calc non Af Amer: 70 mL/min/{1.73_m2} (ref >59)
Globulin, Total: 2 g/dL (ref 1.5–4.5)
Glucose: 82 mg/dL (ref 65–99)
Potassium: 4.6 mmol/L (ref 3.5–5.2)
Sodium: 144 mmol/L (ref 134–144)
Total Protein: 6.7 g/dL (ref 6.0–8.5)

## 2017-09-14 LAB — LIPID PANEL
CHOL/HDL RATIO: 3.7 ratio (ref 0.0–5.0)
Cholesterol, Total: 158 mg/dL (ref 100–199)
HDL: 43 mg/dL (ref >39)
LDL Calculated: 87 mg/dL (ref 0–99)
Triglycerides: 142 mg/dL (ref 0–149)
VLDL Cholesterol Cal: 28 mg/dL (ref 5–40)

## 2017-09-14 LAB — VITAMIN D 25 HYDROXY (VIT D DEFICIENCY, FRACTURES): VIT D 25 HYDROXY: 25.2 ng/mL — AB (ref 30.0–100.0)

## 2017-09-16 ENCOUNTER — Other Ambulatory Visit: Payer: Self-pay | Admitting: *Deleted

## 2017-09-16 MED ORDER — ERGOCALCIFEROL 1.25 MG (50000 UT) PO CAPS: 50000.0000 [IU] | ORAL_CAPSULE | ORAL | 1 refills | Status: DC

## 2017-09-20 ENCOUNTER — Encounter: Payer: BLUE CROSS/BLUE SHIELD | Admitting: Family Medicine

## 2017-11-16 ENCOUNTER — Ambulatory Visit (INDEPENDENT_AMBULATORY_CARE_PROVIDER_SITE_OTHER): Payer: BLUE CROSS/BLUE SHIELD | Admitting: *Deleted

## 2017-11-16 DIAGNOSIS — Z23 Encounter for immunization: Secondary | ICD-10-CM | POA: Diagnosis not present

## 2017-11-16 DIAGNOSIS — Z9229 Personal history of other drug therapy: Secondary | ICD-10-CM

## 2017-11-16 NOTE — Patient Instructions (Signed)
Hepatitis A; Hepatitis B Vaccine injection  What is this medicine?  HEPATITIS A VACCINE; HEPATITIS B VACCINE (hep uh TAHY tis A vak SEEN; hep uh TAHY tis B vak SEEN) is a vaccine to protect from an infection with the hepatitis A and B virus. This vaccine does not contain the live viruses. It will not cause a hepatitis infection.  This medicine may be used for other purposes; ask your health care provider or pharmacist if you have questions.  COMMON BRAND NAME(S): Twinrix  What should I tell my health care provider before I take this medicine?  They need to know if you have any of these conditions:  -bleeding disorder  -fever or infection  -heart disease  -immune system problems  -an unusual or allergic reaction to hepatitis A or B vaccine, neomycin, yeast, thimerosal, other medicines, foods, dyes, or preservatives  -pregnant or trying to get pregnant  -breast-feeding  How should I use this medicine?  This vaccine is for injection into a muscle. It is given by a health care professional.  A copy of Vaccine Information Statements will be given before each vaccination. Read this sheet carefully each time. The sheet may change frequently.  Talk to your pediatrician regarding the use of this medicine in children. Special care may be needed.  Overdosage: If you think you have taken too much of this medicine contact a poison control center or emergency room at once.  NOTE: This medicine is only for you. Do not share this medicine with others.  What if I miss a dose?  It is important not to miss your dose. Call your doctor or health care professional if you are unable to keep an appointment.  What may interact with this medicine?  -medicines that suppress your immune function like adalimumab, anakinra, infliximab  -medicines to treat cancer  -steroid medicines like prednisone or cortisone  This list may not describe all possible interactions. Give your health care provider a list of all the medicines, herbs,  non-prescription drugs, or dietary supplements you use. Also tell them if you smoke, drink alcohol, or use illegal drugs. Some items may interact with your medicine.  What should I watch for while using this medicine?  See your health care provider for all shots of this vaccine as directed. You must have 3 to 4 shots of this vaccine for protection from hepatitis A and B infection. Tell your doctor right away if you have any serious or unusual side effects after getting this vaccine.  What side effects may I notice from receiving this medicine?  Side effects that you should report to your doctor or health care professional as soon as possible:  -allergic reactions like skin rash, itching or hives, swelling of the face, lips, or tongue  -breathing problems  -confused, irritated  -fast, irregular heartbeat  -flu-like syndrome  -numb, tingling pain  -seizures  Side effects that usually do not require medical attention (report to your doctor or health care professional if they continue or are bothersome):  -diarrhea  -fever  -headache  -loss of appetite  -muscle pain  -nausea  -pain, redness, swelling, or irritation at site where injected  -tiredness  This list may not describe all possible side effects. Call your doctor for medical advice about side effects. You may report side effects to FDA at 1-800-FDA-1088.  Where should I keep my medicine?  This drug is given in a hospital or clinic and will not be stored at home.  NOTE: This   sheet is a summary. It may not cover all possible information. If you have questions about this medicine, talk to your doctor, pharmacist, or health care provider.   2018 Elsevier/Gold Standard (2007-10-21 15:21:37)

## 2017-11-16 NOTE — Progress Notes (Signed)
Patient tolerated well. Will return in 5 months for 3rd injection

## 2018-03-12 ENCOUNTER — Other Ambulatory Visit: Payer: Self-pay | Admitting: Family Medicine

## 2018-04-19 ENCOUNTER — Ambulatory Visit (INDEPENDENT_AMBULATORY_CARE_PROVIDER_SITE_OTHER): Payer: BLUE CROSS/BLUE SHIELD | Admitting: *Deleted

## 2018-04-19 DIAGNOSIS — Z23 Encounter for immunization: Secondary | ICD-10-CM | POA: Diagnosis not present

## 2018-04-19 DIAGNOSIS — Z9229 Personal history of other drug therapy: Secondary | ICD-10-CM

## 2018-04-19 NOTE — Progress Notes (Signed)
Pt given 3rd Twinrix vaccine Tolerated well

## 2018-09-05 ENCOUNTER — Other Ambulatory Visit: Payer: Self-pay | Admitting: Family Medicine

## 2018-09-07 ENCOUNTER — Other Ambulatory Visit: Payer: Self-pay | Admitting: Family Medicine

## 2018-09-14 ENCOUNTER — Telehealth (INDEPENDENT_AMBULATORY_CARE_PROVIDER_SITE_OTHER): Payer: BLUE CROSS/BLUE SHIELD | Admitting: Family Medicine

## 2018-09-14 ENCOUNTER — Other Ambulatory Visit: Payer: Self-pay

## 2018-09-14 DIAGNOSIS — R7303 Prediabetes: Secondary | ICD-10-CM

## 2018-09-14 DIAGNOSIS — I10 Essential (primary) hypertension: Secondary | ICD-10-CM | POA: Diagnosis not present

## 2018-09-14 MED ORDER — LISINOPRIL 40 MG PO TABS: 40.0000 mg | ORAL_TABLET | Freq: Every day | ORAL | 0 refills | Status: DC

## 2018-09-14 MED ORDER — VITAMIN D (ERGOCALCIFEROL) 1.25 MG (50000 UNIT) PO CAPS: ORAL_CAPSULE | ORAL | 1 refills | Status: DC

## 2018-09-14 NOTE — Patient Instructions (Signed)
Continue meds as is and add back the vitamin D. Follow up after corona virus pandemic lifts for CPE. Obtain a BP monitor for home use. BP should be under 125/85 whenever taken at rest.

## 2018-09-14 NOTE — Progress Notes (Signed)
   Virtual Visit via telephone Note  I connected with Anthony Austin on 09/14/18 at 2:06 by telephone and verified that I am speaking with the correct person using two identifiers. Anthony Austin is currently located at work during visit. The provider, Claretta Fraise, MD is located in his office at time of visit.  I discussed the limitations, risks, security and privacy concerns of performing an evaluation and management service by telephone and the availability of in person appointments. I also discussed with the patient that there may be a patient responsible charge related to this service. The patient expressed understanding and agreed to proceed.  History of Present Illness  Contact made for evaluation of blood pressure. Pt. Does not check it between visits. He is taking the HCTZ and lisinopril as directed daily. He denies any side effects. He has no current concerns regarding chest pain, dyspnea, numbness or sudden onset weakness of an extremity. No heartburn. Denies edema. Feels that his health overall is good. Denies cough and fever   Vomited once after eating three pieces of pizza night before last. Feels dairy upsets his stomach. Denies heartburn. Had epigastric pain. Occasionally diarrhea occurs an hours eating ice cream.  Assessment and Plan: 1. Essential hypertension   2. Prediabetes     No orders of the defined types were placed in this encounter.    Follow Up Instructions:     I discussed the assessment and treatment plan with the patient. The patient was provided an opportunity to ask questions and all were answered. The patient agreed with the plan and demonstrated an understanding of the instructions.   The patient was advised to call back or seek an in-person evaluation if the symptoms worsen or if the condition fails to improve as anticipated.  The above assessment and management plan was discussed with the patient. The patient verbalized understanding of and has agreed  to the management plan. Patient is aware to call the clinic if symptoms persist or worsen. Patient is aware when to return to the clinic for a follow-up visit. Patient educated on when it is appropriate to go to the emergency department.    I provided 14 minutes of non-face-to-face time during this encounter. Visit ended at 2:20.    Claretta Fraise, MD

## 2018-10-02 ENCOUNTER — Other Ambulatory Visit: Payer: Self-pay | Admitting: Family Medicine

## 2018-10-02 MED ORDER — VALACYCLOVIR HCL 1 G PO TABS: 1000.0000 mg | ORAL_TABLET | Freq: Two times a day (BID) | ORAL | 0 refills | Status: DC

## 2018-10-02 NOTE — Progress Notes (Signed)
Patient call and has what sounds like shingles on right head behind ear, sent valtrex Caryl Pina, MD [facility] 10/02/2018, 10:38 AM

## 2018-10-03 NOTE — Telephone Encounter (Signed)
Last seen 09/13/17  Dr Livia Snellen

## 2018-12-05 ENCOUNTER — Other Ambulatory Visit: Payer: Self-pay | Admitting: Family Medicine

## 2018-12-12 ENCOUNTER — Other Ambulatory Visit: Payer: Self-pay | Admitting: Family Medicine

## 2019-01-15 ENCOUNTER — Other Ambulatory Visit: Payer: Self-pay | Admitting: Family Medicine

## 2019-02-13 ENCOUNTER — Ambulatory Visit (INDEPENDENT_AMBULATORY_CARE_PROVIDER_SITE_OTHER): Payer: BC Managed Care – PPO | Admitting: Family Medicine

## 2019-02-13 ENCOUNTER — Encounter: Payer: Self-pay | Admitting: Family Medicine

## 2019-02-13 ENCOUNTER — Other Ambulatory Visit: Payer: Self-pay

## 2019-02-13 VITALS — BP 142/94 | HR 61 | Temp 97.8°F | Ht 67.5 in | Wt 193.0 lb

## 2019-02-13 DIAGNOSIS — I10 Essential (primary) hypertension: Secondary | ICD-10-CM

## 2019-02-13 DIAGNOSIS — E559 Vitamin D deficiency, unspecified: Secondary | ICD-10-CM | POA: Diagnosis not present

## 2019-02-13 DIAGNOSIS — R202 Paresthesia of skin: Secondary | ICD-10-CM

## 2019-02-13 DIAGNOSIS — M7712 Lateral epicondylitis, left elbow: Secondary | ICD-10-CM

## 2019-02-13 DIAGNOSIS — Z6829 Body mass index (BMI) 29.0-29.9, adult: Secondary | ICD-10-CM | POA: Insufficient documentation

## 2019-02-13 DIAGNOSIS — R7303 Prediabetes: Secondary | ICD-10-CM | POA: Diagnosis not present

## 2019-02-13 LAB — BAYER DCA HB A1C WAIVED: HB A1C (BAYER DCA - WAIVED): 5.8 % (ref <7.0)

## 2019-02-13 MED ORDER — VITAMIN D (ERGOCALCIFEROL) 1.25 MG (50000 UNIT) PO CAPS: ORAL_CAPSULE | ORAL | 1 refills | Status: DC

## 2019-02-13 MED ORDER — LISINOPRIL 40 MG PO TABS: 40.0000 mg | ORAL_TABLET | Freq: Every day | ORAL | 3 refills | Status: DC

## 2019-02-13 MED ORDER — HYDROCHLOROTHIAZIDE 25 MG PO TABS: 12.5000 mg | ORAL_TABLET | Freq: Every day | ORAL | 3 refills | Status: DC

## 2019-02-13 MED ORDER — PREDNISONE 20 MG PO TABS: ORAL_TABLET | ORAL | 0 refills | Status: DC

## 2019-02-13 NOTE — Patient Instructions (Addendum)
DASH Eating Plan DASH stands for "Dietary Approaches to Stop Hypertension." The DASH eating plan is a healthy eating plan that has been shown to reduce high blood pressure (hypertension). Additional health benefits may include reducing the risk of type 2 diabetes mellitus, heart disease, and stroke. The DASH eating plan may also help with weight loss.  WHAT DO I NEED TO KNOW ABOUT THE DASH EATING PLAN? For the DASH eating plan, you will follow these general guidelines:  Choose foods with a percent daily value for sodium of less than 5% (as listed on the food label).  Use salt-free seasonings or herbs instead of table salt or sea salt.  Check with your health care provider or pharmacist before using salt substitutes.  Eat lower-sodium products, often labeled as "lower sodium" or "no salt added."  Eat fresh foods.  Eat more vegetables, fruits, and low-fat dairy products.  Choose whole grains. Look for the word "whole" as the first word in the ingredient list.  Choose fish and skinless chicken or turkey more often than red meat. Limit fish, poultry, and meat to 6 oz (170 g) each day.  Limit sweets, desserts, sugars, and sugary drinks.  Choose heart-healthy fats.  Limit cheese to 1 oz (28 g) per day.  Eat more home-cooked food and less restaurant, buffet, and fast food.  Limit fried foods.  Cook foods using methods other than frying.  Limit canned vegetables. If you do use them, rinse them well to decrease the sodium.  When eating at a restaurant, ask that your food be prepared with less salt, or no salt if possible.  WHAT FOODS CAN I EAT? Seek help from a dietitian for individual calorie needs.  Grains Whole grain or whole wheat bread. Brown rice. Whole grain or whole wheat pasta. Quinoa, bulgur, and whole grain cereals. Low-sodium cereals. Corn or whole wheat flour tortillas. Whole grain cornbread. Whole grain crackers. Low-sodium crackers.  Vegetables Fresh or frozen  vegetables (raw, steamed, roasted, or grilled). Low-sodium or reduced-sodium tomato and vegetable juices. Low-sodium or reduced-sodium tomato sauce and paste. Low-sodium or reduced-sodium canned vegetables.   Fruits All fresh, canned (in natural juice), or frozen fruits.  Meat and Other Protein Products Ground beef (85% or leaner), grass-fed beef, or beef trimmed of fat. Skinless chicken or turkey. Ground chicken or turkey. Pork trimmed of fat. All fish and seafood. Eggs. Dried beans, peas, or lentils. Unsalted nuts and seeds. Unsalted canned beans.  Dairy Low-fat dairy products, such as skim or 1% milk, 2% or reduced-fat cheeses, low-fat ricotta or cottage cheese, or plain low-fat yogurt. Low-sodium or reduced-sodium cheeses.  Fats and Oils Tub margarines without trans fats. Light or reduced-fat mayonnaise and salad dressings (reduced sodium). Avocado. Safflower, olive, or canola oils. Natural peanut or almond butter.  Other Unsalted popcorn and pretzels. The items listed above may not be a complete list of recommended foods or beverages. Contact your dietitian for more options.  WHAT FOODS ARE NOT RECOMMENDED?  Grains White bread. White pasta. White rice. Refined cornbread. Bagels and croissants. Crackers that contain trans fat.  Vegetables Creamed or fried vegetables. Vegetables in a cheese sauce. Regular canned vegetables. Regular canned tomato sauce and paste. Regular tomato and vegetable juices.  Fruits Dried fruits. Canned fruit in light or heavy syrup. Fruit juice.  Meat and Other Protein Products Fatty cuts of meat. Ribs, chicken wings, bacon, sausage, bologna, salami, chitterlings, fatback, hot dogs, bratwurst, and packaged luncheon meats. Salted nuts and seeds. Canned beans with salt.    Dairy Whole or 2% milk, cream, half-and-half, and cream cheese. Whole-fat or sweetened yogurt. Full-fat cheeses or blue cheese. Nondairy creamers and whipped toppings. Processed cheese,  cheese spreads, or cheese curds.  Condiments Onion and garlic salt, seasoned salt, table salt, and sea salt. Canned and packaged gravies. Worcestershire sauce. Tartar sauce. Barbecue sauce. Teriyaki sauce. Soy sauce, including reduced sodium. Steak sauce. Fish sauce. Oyster sauce. Cocktail sauce. Horseradish. Ketchup and mustard. Meat flavorings and tenderizers. Bouillon cubes. Hot sauce. Tabasco sauce. Marinades. Taco seasonings. Relishes.  Fats and Oils Butter, stick margarine, lard, shortening, ghee, and bacon fat. Coconut, palm kernel, or palm oils. Regular salad dressings.  Other Pickles and olives. Salted popcorn and pretzels.  The items listed above may not be a complete list of foods and beverages to avoid. Contact your dietitian for more information.  WHERE CAN I FIND MORE INFORMATION? National [employer], [employer], [employer]: travelstabloid.com Document Released: 05/28/2011 Document Revised: 10/23/2013 Document Reviewed: 04/12/2013 [facility] Patient Information 2015 [place], [place]. This information is not intended to replace advice given to you by your health care provider. Make sure you discuss any questions you have with your health care provider.   I think that you would greatly benefit from seeing a nutritionist.  If you are interested, please call Dr Jenne Campus at [phone] to schedule an appointment.   Tennis Elbow Tennis elbow is swelling (inflammation) in your outer forearm, near your elbow. Swelling affects the tissues that connect muscle to bone (tendons). Tennis elbow can happen in any sport or job in which you use your elbow too much. It is caused by doing the same motion over and over. Tennis elbow can cause:  Pain and tenderness in your forearm and the outer part of your elbow. You may have pain all the time, or only when using the arm.  A burning feeling. This runs from your elbow through your arm.  Weak grip in your hand. Follow  these instructions at home: Activity  Rest your elbow and wrist. Avoid activities that cause problems, as told by your doctor.  If told by your doctor, wear an elbow strap to reduce stress on the area.  Do physical therapy exercises as told.  If you lift an object, lift it with your palm facing up. This is easier on your elbow. Lifestyle  If your tennis elbow is caused by sports, check your equipment and make sure that: ? You are using it correctly. ? It fits you well.  If your tennis elbow is caused by work or by using a computer, take breaks often to stretch your arm. Talk with your manager about how you can manage your condition at work. If you have a brace:  Wear the brace as told by your doctor. Remove it only as told by your doctor.  Loosen the brace if your fingers tingle, get numb, or turn cold and blue.  Keep the brace clean.  If the brace is not waterproof, ask your doctor if you may take the brace off for bathing. If you must keep the brace on while bathing: ? Do not let it get wet. ? Cover it with a watertight covering when you take a bath or a shower. General instructions   If told, put ice on the painful area: ? Put ice in a plastic bag. ? Place a towel between your skin and the bag. ? Leave the ice on for 20 minutes, 2-3 times a day.  Take over-the-counter and prescription medicines only as told  by your doctor.  Keep all follow-up visits as told by your doctor. This is important. Contact a doctor if:  Your pain does not get better with treatment.  Your pain gets worse.  You have weakness in your forearm, hand, or fingers.  You cannot feel your forearm, hand, or fingers. Summary  Tennis elbow is swelling (inflammation) in your outer forearm, near your elbow.  Tennis elbow is caused by doing the same motion over and over.  Rest your elbow and wrist. Avoid activities that cause problems, as told by your doctor.  If told, put ice on the painful area  for 20 minutes, 2-3 times a day. This information is not intended to replace advice given to you by your health care provider. Make sure you discuss any questions you have with your health care provider. Document Released: 11/26/2009 Document Revised: 03/04/2018 Document Reviewed: 03/23/2017 Elsevier Patient Education  2020 [employer].

## 2019-02-13 NOTE — Progress Notes (Signed)
Subjective:  Patient ID: Anthony Austin, male    DOB: [birth year], 58 y.o.   MRN: [id number]  Patient Care Team: Claretta Fraise, MD as PCP - General (Family Medicine)   Chief Complaint:  Medical Management of Chronic Issues (refills) and Hypertension   HPI: Anthony Austin is a 58 y.o. male presenting on 02/13/2019 for Medical Management of Chronic Issues (refills) and Hypertension   1. Essential hypertension  Complaint with meds - Yes, but has not had today due to running out yesterday Current Medications - lisinopril, hctz Checking BP at home - No Exercising Regularly - No Watching Salt intake - Yes Pertinent ROS:  Headache - No Fatigue - No Visual Disturbances - No Chest pain - No Dyspnea - No Palpitations - No LE edema - No They report good compliance with medications and can restate their regimen by memory. No medication side effects.  Family, social, and smoking history reviewed.   BP Readings from Last 3 Encounters:  02/13/19 (!) 142/94  09/13/17 119/74  09/02/16 129/74   CMP Latest Ref Rng & Units 09/13/2017 10/02/2016 09/02/2016  Glucose 65 - 99 mg/dL 82 100(H) 102(H)  BUN 6 - 24 mg/dL 21 28(H) 15  Creatinine 0.76 - 1.27 mg/dL 1.15 1.34(H) 1.04  Sodium 134 - 144 mmol/L 144 138 140  Potassium 3.5 - 5.2 mmol/L 4.6 4.6 3.7  Chloride 96 - 106 mmol/L 102 97 96  CO2 20 - 29 mmol/L 25 30(H) 32(H)  Calcium 8.7 - 10.2 mg/dL 9.5 9.6 10.0  Total Protein 6.0 - 8.5 g/dL 6.7 6.9 6.8  Total Bilirubin 0.0 - 1.2 mg/dL 0.4 0.4 0.8  Alkaline Phos 39 - 117 IU/L 57 67 68  AST 0 - 40 IU/L 24 33 52(H)  ALT 0 - 44 IU/L 26 58(H) 97(H)      2. Prediabetes  Has had previous elevated blood glucose levels. Pt denies polyuria, polydipsia, or polyphagia. He does try to watch diet but not closely. He does stay active.   3. BMI 29.0-29.9,adult  Does try to watch diet. Does stay active on a regular basis. Has gained weight over the last several months.    4. Vitamin D deficiency  Pt is  taking oral repletion therapy. Denies bone pain and tenderness, muscle weakness, fracture, and difficulty walking. Lab Results  Component Value Date   VD25OH 25.2 (L) 09/13/2017   VD25OH 30.0 03/20/2015   VD25OH 19.8 (L) 09/20/2014   Lab Results  Component Value Date   CALCIUM 9.5 09/13/2017      5. Lateral epicondylitis of left elbow  Pt reports ongoing left lateral forearm pain. States this started several weeks ago and is worse with certain movements of his arm. He denies known injury. He has tried tylenol for the pain without relief of the symptoms.    6. Paresthesia of both feet  Pt reports ongoing intermittent tingling of bilateral feet. States worse at night. States this started several years and has not improved. He has not tired anything for this. States his PCP is aware of the symptoms. No weakness, injury, back pain, or loss of function.      Relevant past medical, surgical, family, and social history reviewed and updated as indicated.  Allergies and medications reviewed and updated. Date reviewed: Chart in Epic.   Past Medical History:  Diagnosis Date  . Hyperlipidemia   . Hypertension   . Prostate CA ([place]) 2005    Past Surgical History:  Procedure Laterality Date  .  PROSTATE SURGERY    . PROSTATECTOMY  2005  . PROSTATECTOMY      Social History   Socioeconomic History  . Marital status: Divorced    Spouse name: Not on file  . Number of children: Not on file  . Years of education: Not on file  . Highest education level: Not on file  Occupational History  . Not on file  Social Needs  . Financial resource strain: Not on file  . Food insecurity    Worry: Not on file    Inability: Not on file  . Transportation needs    Medical: Not on file    Non-medical: Not on file  Tobacco Use  . Smoking status: Never Smoker  . Smokeless tobacco: Never Used  Substance and Sexual Activity  . Alcohol use: No  . Drug use: No  . Sexual activity: Not on file   Lifestyle  . Physical activity    Days per week: Not on file    Minutes per session: Not on file  . Stress: Not on file  Relationships  . Social Herbalist on phone: Not on file    Gets together: Not on file    Attends religious service: Not on file    Active member of club or organization: Not on file    Attends meetings of clubs or organizations: Not on file    Relationship status: Not on file  . Intimate partner violence    Fear of current or ex partner: Not on file    Emotionally abused: Not on file    Physically abused: Not on file    Forced sexual activity: Not on file  Other Topics Concern  . Not on file  Social History Narrative  . Not on file    Outpatient Encounter Medications as of 02/13/2019  Medication Sig  . hydrochlorothiazide (HYDRODIURIL) 25 MG tablet Take 0.5 tablets (12.5 mg total) by mouth daily.  Marland Kitchen lisinopril (ZESTRIL) 40 MG tablet Take 1 tablet (40 mg total) by mouth daily.  . Vitamin D, Ergocalciferol, (DRISDOL) 1.25 MG (50000 UT) CAPS capsule TAKE ONE CAPSULE BY MOUTH ONE TIME PER WEEK  . [DISCONTINUED] hydrochlorothiazide (HYDRODIURIL) 25 MG tablet Take 0.5 tablets (12.5 mg total) by mouth daily.  . [DISCONTINUED] lisinopril (PRINIVIL,ZESTRIL) 40 MG tablet TAKE 1 TABLET BY MOUTH EVERY DAY  . [DISCONTINUED] Vitamin D, Ergocalciferol, (DRISDOL) 1.25 MG (50000 UT) CAPS capsule TAKE ONE CAPSULE BY MOUTH ONE TIME PER WEEK  . predniSONE (DELTASONE) 20 MG tablet 2 po at sametime daily for 5 days  . [DISCONTINUED] valACYclovir (VALTREX) 1000 MG tablet Take 1 tablet (1,000 mg total) by mouth 2 (two) times daily.   No facility-administered encounter medications on file as of 02/13/2019.     No Known Allergies  Review of Systems  Constitutional: Negative for activity change, appetite change, chills, fatigue, fever and unexpected weight change.  HENT: Negative.   Eyes: Negative.  Negative for photophobia and visual disturbance.  Respiratory: Negative  for cough, chest tightness and shortness of breath.   Cardiovascular: Negative for chest pain, palpitations and leg swelling.  Gastrointestinal: Negative for abdominal distention, abdominal pain, anal bleeding, blood in stool, constipation, diarrhea, nausea, rectal pain and vomiting.  Endocrine: Negative.  Negative for cold intolerance, heat intolerance, polydipsia, polyphagia and polyuria.  Genitourinary: Negative for decreased urine volume, difficulty urinating, dysuria, frequency and urgency.  Musculoskeletal: Positive for arthralgias (left forearm) and myalgias (left forearm). Negative for back pain, gait problem, joint  swelling, neck pain and neck stiffness.  Skin: Negative.  Negative for color change, pallor, rash and wound.  Allergic/Immunologic: Negative.   Neurological: Negative for dizziness, tremors, seizures, syncope, facial asymmetry, speech difficulty, weakness, light-headedness, numbness and headaches.       Tingling to bilateral feet  Hematological: Negative.  Does not bruise/bleed easily.  Psychiatric/Behavioral: Negative for confusion, hallucinations, sleep disturbance and suicidal ideas.  All other systems reviewed and are negative.       Objective:  BP (!) 142/94   Pulse 61   Temp 97.8 F (36.6 C)   Ht 5' 7.5" (1.715 m)   Wt 193 lb (87.5 kg)   BMI 29.78 kg/m    Wt Readings from Last 3 Encounters:  02/13/19 193 lb (87.5 kg)  09/13/17 178 lb 8 oz (81 kg)  09/02/16 193 lb (87.5 kg)    Physical Exam Vitals signs and nursing note reviewed.  Constitutional:      General: He is not in acute distress.    Appearance: Normal appearance. He is well-developed, well-groomed and overweight. He is not ill-appearing, toxic-appearing or diaphoretic.  HENT:     Head: Normocephalic and atraumatic.     Jaw: There is normal jaw occlusion.     Right Ear: Hearing, tympanic membrane, ear canal and external ear normal.     Left Ear: Hearing, tympanic membrane, ear canal and  external ear normal.     Nose: Nose normal.     Mouth/Throat:     Lips: Pink.     Mouth: Mucous membranes are moist.     Pharynx: Oropharynx is clear. Uvula midline.  Eyes:     General: Lids are normal.     Extraocular Movements: Extraocular movements intact.     Conjunctiva/sclera: Conjunctivae normal.     Pupils: Pupils are equal, round, and reactive to light.  Neck:     Musculoskeletal: Normal range of motion and neck supple.     Thyroid: No thyroid mass, thyromegaly or thyroid tenderness.     Vascular: No carotid bruit or JVD.     Trachea: Trachea and phonation normal.  Cardiovascular:     Rate and Rhythm: Normal rate and regular rhythm.     Chest Hutto: PMI is not displaced.     Pulses: Normal pulses.          Dorsalis pedis pulses are 2+ on the right side and 2+ on the left side.       Posterior tibial pulses are 2+ on the right side and 2+ on the left side.     Heart sounds: Normal heart sounds. No murmur. No friction rub. No gallop.   Pulmonary:     Effort: Pulmonary effort is normal. No respiratory distress.     Breath sounds: Normal breath sounds. No wheezing.  Abdominal:     General: Bowel sounds are normal. There is no distension or abdominal bruit.     Palpations: Abdomen is soft. There is no hepatomegaly or splenomegaly.     Tenderness: There is no abdominal tenderness. There is no right CVA tenderness or left CVA tenderness.     Hernia: No hernia is present.  Musculoskeletal: Normal range of motion.     Left shoulder: Normal.     Left elbow: He exhibits normal range of motion, no swelling, no effusion, no deformity and no laceration. Tenderness found. Lateral epicondyle tenderness noted. No radial head, no medial epicondyle and no olecranon process tenderness noted.     Left wrist: Normal.  He exhibits normal range of motion, no tenderness, no bony tenderness, no swelling, no effusion, no crepitus, no deformity and no laceration.     Cervical back: Normal.     Lumbar  back: Normal.       Arms:     Right lower leg: No edema.     Left lower leg: No edema.     Right foot: Normal.  Feet:     Right foot:     Protective Sensation: 10 sites tested. 10 sites sensed.     Skin integrity: Skin integrity normal.     Left foot:     Protective Sensation: 10 sites tested. 10 sites sensed.     Skin integrity: Skin integrity normal.  Lymphadenopathy:     Cervical: No cervical adenopathy.  Skin:    General: Skin is warm and dry.     Capillary Refill: Capillary refill takes less than 2 seconds.     Coloration: Skin is not cyanotic, jaundiced or pale.     Findings: No rash.  Neurological:     General: No focal deficit present.     Mental Status: He is alert and oriented to person, place, and time.     Cranial Nerves: Cranial nerves are intact.     Sensory: Sensation is intact.     Motor: Motor function is intact.     Coordination: Coordination is intact.     Gait: Gait is intact.     Deep Tendon Reflexes: Reflexes are normal and symmetric.  Psychiatric:        Attention and Perception: Attention and perception normal.        Mood and Affect: Mood and affect normal.        Speech: Speech normal.        Behavior: Behavior normal. Behavior is cooperative.        Thought Content: Thought content normal.        Cognition and Memory: Cognition and memory normal.        Judgment: Judgment normal.     Results for orders placed or performed in visit on 09/13/17  CBC with Differential/Platelet  Result Value Ref Range   WBC 6.6 3.4 - 10.8 x10E3/uL   RBC 4.86 4.14 - 5.80 x10E6/uL   Hemoglobin 15.6 13.0 - 17.7 g/dL   Hematocrit 43.3 37.5 - 51.0 %   MCV 89 79 - 97 fL   MCH 32.1 26.6 - 33.0 pg   MCHC 36.0 (H) 31.5 - 35.7 g/dL   RDW 13.7 12.3 - 15.4 %   Platelets 257 150 - 379 x10E3/uL   Neutrophils 65 Not Estab. %   Lymphs 27 Not Estab. %   Monocytes 6 Not Estab. %   Eos 2 Not Estab. %   Basos 0 Not Estab. %   Neutrophils Absolute 4.2 1.4 - 7.0 x10E3/uL    Lymphocytes Absolute 1.8 0.7 - 3.1 x10E3/uL   Monocytes Absolute 0.4 0.1 - 0.9 x10E3/uL   EOS (ABSOLUTE) 0.2 0.0 - 0.4 x10E3/uL   Basophils Absolute 0.0 0.0 - 0.2 x10E3/uL   Immature Granulocytes 0 Not Estab. %   Immature Grans (Abs) 0.0 0.0 - 0.1 x10E3/uL  CMP14+EGFR  Result Value Ref Range   Glucose 82 65 - 99 mg/dL   BUN 21 6 - 24 mg/dL   Creatinine, Ser 1.15 0.76 - 1.27 mg/dL   GFR calc non Af Amer 70 >59 mL/min/1.73   GFR calc Af Amer 81 >59 mL/min/1.73   BUN/Creatinine Ratio 18  9 - 20   Sodium 144 134 - 144 mmol/L   Potassium 4.6 3.5 - 5.2 mmol/L   Chloride 102 96 - 106 mmol/L   CO2 25 20 - 29 mmol/L   Calcium 9.5 8.7 - 10.2 mg/dL   Total Protein 6.7 6.0 - 8.5 g/dL   Albumin 4.7 3.5 - 5.5 g/dL   Globulin, Total 2.0 1.5 - 4.5 g/dL   Albumin/Globulin Ratio 2.4 (H) 1.2 - 2.2   Bilirubin Total 0.4 0.0 - 1.2 mg/dL   Alkaline Phosphatase 57 39 - 117 IU/L   AST 24 0 - 40 IU/L   ALT 26 0 - 44 IU/L  Lipid panel  Result Value Ref Range   Cholesterol, Total 158 100 - 199 mg/dL   Triglycerides 142 0 - 149 mg/dL   HDL 43 >39 mg/dL   VLDL Cholesterol Cal 28 5 - 40 mg/dL   LDL Calculated 87 0 - 99 mg/dL   Chol/HDL Ratio 3.7 0.0 - 5.0 ratio  PSA, total and free  Result Value Ref Range   Prostate Specific Ag, Serum 1.1 0.0 - 4.0 ng/mL   PSA, Free 0.03 N/A ng/mL   PSA, Free Pct 2.7 %  VITAMIN D 25 Hydroxy (Vit-D Deficiency, Fractures)  Result Value Ref Range   Vit D, 25-Hydroxy 25.2 (L) 30.0 - 100.0 ng/mL  Bayer DCA Hb A1c Waived  Result Value Ref Range   HB A1C (BAYER DCA - WAIVED) 5.3 <7.0 %       Pertinent labs & imaging results that were available during my care of the patient were reviewed by me and considered in my medical decision making.  Assessment & Plan:  Tyress was seen today for medical management of chronic issues and hypertension.  Diagnoses and all orders for this visit:  Essential hypertension BP usually well controlled, pt did not take BP medications  today due to running out yesterday. Changes were not made in regimen today. Daily blood pressure log given with instructions on how to fill out and told to bring to next visit. Gaol BP 130/80. Pt aware to report any persistent high or low readings. DASH diet and exercise encouraged. Exercise at least 150 minutes per week and increase as tolerated. Goal BMI > 25. Stress management encouraged. Smoking cessation discussed. Avoid excessive alcohol. Avoid NSAID's. Avoid more than 2000 mg of sodium daily. Medications as prescribed. Follow up as scheduled.  -     CMP14+EGFR -     CBC with Differential/Platelet -     Lipid panel -     Thyroid Panel With TSH -     hydrochlorothiazide (HYDRODIURIL) 25 MG tablet; Take 0.5 tablets (12.5 mg total) by mouth daily. -     lisinopril (ZESTRIL) 40 MG tablet; Take 1 tablet (40 mg total) by mouth daily.  Prediabetes A1C 5.8 today. Discussed diet and exercise. Pt aware to avoid simple carbs, sugary foods, and sugary drinks. Will recheck at next appointment to determine if medications need to be initiated.  -     CMP14+EGFR -     Bayer DCA Hb A1c Waived  BMI 29.0-29.9,adult Diet and exercise encouraged.  -     CMP14+EGFR -     CBC with Differential/Platelet -     Lipid panel -     Thyroid Panel With TSH -     Bayer DCA Hb A1c Waived  Vitamin D deficiency Labs pending. Continue repletion therapy. If indicated, will change repletion dosage. Eat foods rich in Vit  D including milk, orange juice, yogurt with vitamin D added, salmon or mackerel, canned tuna fish, cereals with vitamin D added, and cod liver oil. Get out in the sun but make sure to wear at least SPF 30 sunscreen.  -     CBC with Differential/Platelet -     VITAMIN D 25 Hydroxy (Vit-D Deficiency, Fractures) -     Vitamin D, Ergocalciferol, (DRISDOL) 1.25 MG (50000 UT) CAPS capsule; TAKE ONE CAPSULE BY MOUTH ONE TIME PER WEEK  Lateral epicondylitis of left elbow Sings and symptoms consistent with  lateral epicondylitis. Symptomatic care discussed. Counter force strap applied in office. Will initiate NSAIDs if kidney function is normal. Will give burst of steroids to see if beneficial. Pt aware if symptoms do not improve with conservative therapy, we can do trigger point injection or refer to ortho. Follow up in 4-6 weeks if symptoms do not improve.  -     predniSONE (DELTASONE) 20 MG tablet; 2 po at sametime daily for 5 days  Paresthesia of both feet Ongoing paresthesia of bilateral feet. No weakness or focal deficits. Will check Vit B 12 today. If normal and symptoms persist, will refer for nerve conduction studies.  -     Vitamin B12     Continue all other maintenance medications.  Follow up plan: Return in about 4 weeks (around 03/13/2019), or if symptoms worsen or fail to improve, for tennis elbow.  Continue healthy lifestyle choices, including diet (rich in fruits, vegetables, and lean proteins, and low in salt and simple carbohydrates) and exercise (at least 30 minutes of moderate physical activity daily).  Educational handout given for DASH diet, tennis elbow  The above assessment and management plan was discussed with the patient. The patient verbalized understanding of and has agreed to the management plan. Patient is aware to call the clinic if symptoms persist or worsen. Patient is aware when to return to the clinic for a follow-up visit. Patient educated on when it is appropriate to go to the emergency department.   Monia Pouch, FNP-C [facility] [phone] 02/13/19

## 2019-02-14 ENCOUNTER — Other Ambulatory Visit: Payer: Self-pay | Admitting: Family Medicine

## 2019-02-14 ENCOUNTER — Telehealth: Payer: Self-pay | Admitting: Family Medicine

## 2019-02-14 DIAGNOSIS — M7712 Lateral epicondylitis, left elbow: Secondary | ICD-10-CM

## 2019-02-14 LAB — CMP14+EGFR
ALT: 42 IU/L (ref 0–44)
AST: 26 IU/L (ref 0–40)
Albumin/Globulin Ratio: 2.4 — ABNORMAL HIGH (ref 1.2–2.2)
Albumin: 4.8 g/dL (ref 3.8–4.9)
Alkaline Phosphatase: 59 IU/L (ref 39–117)
BUN/Creatinine Ratio: 14 (ref 9–20)
BUN: 16 mg/dL (ref 6–24)
Bilirubin Total: 0.5 mg/dL (ref 0.0–1.2)
CO2: 27 mmol/L (ref 20–29)
Calcium: 9.6 mg/dL (ref 8.7–10.2)
Chloride: 99 mmol/L (ref 96–106)
Creatinine, Ser: 1.16 mg/dL (ref 0.76–1.27)
GFR calc Af Amer: 80 mL/min/{1.73_m2} (ref >59)
GFR calc non Af Amer: 69 mL/min/{1.73_m2} (ref >59)
Globulin, Total: 2 g/dL (ref 1.5–4.5)
Glucose: 87 mg/dL (ref 65–99)
Potassium: 3.9 mmol/L (ref 3.5–5.2)
Sodium: 141 mmol/L (ref 134–144)
Total Protein: 6.8 g/dL (ref 6.0–8.5)

## 2019-02-14 LAB — CBC WITH DIFFERENTIAL/PLATELET
Basophils Absolute: 0 10*3/uL (ref 0.0–0.2)
Basos: 1 %
EOS (ABSOLUTE): 0.1 10*3/uL (ref 0.0–0.4)
Eos: 2 %
Hematocrit: 47.2 % (ref 37.5–51.0)
Hemoglobin: 16 g/dL (ref 13.0–17.7)
Immature Grans (Abs): 0 10*3/uL (ref 0.0–0.1)
Immature Granulocytes: 0 %
Lymphocytes Absolute: 2.6 10*3/uL (ref 0.7–3.1)
Lymphs: 39 %
MCH: 32.1 pg (ref 26.6–33.0)
MCHC: 33.9 g/dL (ref 31.5–35.7)
MCV: 95 fL (ref 79–97)
Monocytes Absolute: 0.5 10*3/uL (ref 0.1–0.9)
Monocytes: 7 %
Neutrophils Absolute: 3.4 10*3/uL (ref 1.4–7.0)
Neutrophils: 51 %
Platelets: 232 10*3/uL (ref 150–450)
RBC: 4.98 x10E6/uL (ref 4.14–5.80)
RDW: 13.1 % (ref 11.6–15.4)
WBC: 6.6 10*3/uL (ref 3.4–10.8)

## 2019-02-14 LAB — THYROID PANEL WITH TSH
Free Thyroxine Index: 2.1 (ref 1.2–4.9)
T3 Uptake Ratio: 25 % (ref 24–39)
T4, Total: 8.3 ug/dL (ref 4.5–12.0)
TSH: 2.53 u[IU]/mL (ref 0.450–4.500)

## 2019-02-14 LAB — LIPID PANEL
Chol/HDL Ratio: 6 ratio — ABNORMAL HIGH (ref 0.0–5.0)
Cholesterol, Total: 187 mg/dL (ref 100–199)
HDL: 31 mg/dL — ABNORMAL LOW (ref >39)
LDL Calculated: 91 mg/dL (ref 0–99)
Triglycerides: 327 mg/dL — ABNORMAL HIGH (ref 0–149)
VLDL Cholesterol Cal: 65 mg/dL — ABNORMAL HIGH (ref 5–40)

## 2019-02-14 LAB — VITAMIN D 25 HYDROXY (VIT D DEFICIENCY, FRACTURES): Vit D, 25-Hydroxy: 50.5 ng/mL (ref 30.0–100.0)

## 2019-02-14 LAB — VITAMIN B12: Vitamin B-12: 725 pg/mL (ref 232–1245)

## 2019-02-14 MED ORDER — NAPROXEN 500 MG PO TABS: 500.0000 mg | ORAL_TABLET | Freq: Two times a day (BID) | ORAL | 0 refills | Status: DC

## 2019-02-14 NOTE — Telephone Encounter (Signed)
Discussed labs with patient and answered questions. Patient verbalized understanding

## 2019-03-09 ENCOUNTER — Other Ambulatory Visit: Payer: Self-pay | Admitting: Family Medicine

## 2019-03-09 DIAGNOSIS — M7712 Lateral epicondylitis, left elbow: Secondary | ICD-10-CM

## 2019-06-13 ENCOUNTER — Other Ambulatory Visit: Payer: Self-pay | Admitting: Family Medicine

## 2019-06-13 DIAGNOSIS — M7712 Lateral epicondylitis, left elbow: Secondary | ICD-10-CM

## 2019-09-14 ENCOUNTER — Ambulatory Visit: Payer: Self-pay | Attending: Internal Medicine

## 2019-09-14 DIAGNOSIS — Z23 Encounter for immunization: Secondary | ICD-10-CM

## 2019-09-14 NOTE — Progress Notes (Signed)
   Covid-19 Vaccination Clinic  Name:  Anthony Austin    MRN: [id number] DOB: 01/06/1961  09/14/2019  [name] was observed post Covid-19 immunization for 15 minutes without incident. He was provided with Vaccine Information Sheet and instruction to access the V-Safe system.   [name] was instructed to call 911 with any severe reactions post vaccine: Marland Kitchen Difficulty breathing  . Swelling of face and throat  . A fast heartbeat  . A bad rash all over body  . Dizziness and weakness   Immunizations Administered    Name Date Dose VIS Date Route   Pfizer COVID-19 Vaccine 09/14/2019 12:17 PM 0.3 mL 06/02/2019 Intramuscular   Manufacturer: [place]   Lot: [id number]   [place]: [id number]

## 2019-09-17 ENCOUNTER — Other Ambulatory Visit: Payer: Self-pay | Admitting: Family Medicine

## 2019-09-17 DIAGNOSIS — E559 Vitamin D deficiency, unspecified: Secondary | ICD-10-CM

## 2019-10-09 ENCOUNTER — Ambulatory Visit: Payer: Self-pay | Attending: Internal Medicine

## 2019-10-09 DIAGNOSIS — Z23 Encounter for immunization: Secondary | ICD-10-CM

## 2019-10-09 NOTE — Progress Notes (Signed)
   Covid-19 Vaccination Clinic  Name:  BRECKER FEUERBORN    MRN: [id number] DOB: [birth year]  10/09/2019  Mr. Hardiman was observed post Covid-19 immunization for 15 minutes without incident. He was provided with Vaccine Information Sheet and instruction to access the V-Safe system.   Mr. Aldridge was instructed to call 911 with any severe reactions post vaccine: Marland Kitchen Difficulty breathing  . Swelling of face and throat  . A fast heartbeat  . A bad rash all over body  . Dizziness and weakness   Immunizations Administered    Name Date Dose VIS Date Route   Pfizer COVID-19 Vaccine 10/09/2019 10:48 AM 0.3 mL 08/16/2018 Intramuscular   Manufacturer: [place]   Lot: [id number]   [place]: [id number]

## 2020-04-30 ENCOUNTER — Telehealth: Payer: Self-pay

## 2020-04-30 NOTE — Telephone Encounter (Signed)
Attempted to contact patient - NA Last seen 02/13/19 NTBS If patient calls back please schedule the appointment

## 2020-04-30 NOTE — Telephone Encounter (Signed)
  Prescription Request  04/30/2020  What is the name of the medication or equipment? lisinopril (ZESTRIL) 40 MG tablet   Have you contacted your pharmacy to request a refill? (if applicable) yes  Which pharmacy would you like this sent to? [facility]    Patient notified that their request is being sent to the clinical staff for review and that they should receive a response within 2 business days.

## 2020-04-30 NOTE — Telephone Encounter (Signed)
Pt rc for nurse 

## 2020-05-17 ENCOUNTER — Other Ambulatory Visit: Payer: Self-pay | Admitting: Family Medicine

## 2020-05-17 DIAGNOSIS — I10 Essential (primary) hypertension: Secondary | ICD-10-CM

## 2020-06-25 ENCOUNTER — Other Ambulatory Visit: Payer: Self-pay

## 2020-06-25 ENCOUNTER — Encounter: Payer: Self-pay | Admitting: Family Medicine

## 2020-06-25 ENCOUNTER — Ambulatory Visit (INDEPENDENT_AMBULATORY_CARE_PROVIDER_SITE_OTHER): Payer: BC Managed Care – PPO | Admitting: Family Medicine

## 2020-06-25 VITALS — BP 166/102 | HR 63 | Temp 98.0°F | Ht 67.5 in | Wt 197.0 lb

## 2020-06-25 DIAGNOSIS — Z0000 Encounter for general adult medical examination without abnormal findings: Secondary | ICD-10-CM | POA: Diagnosis not present

## 2020-06-25 DIAGNOSIS — R7303 Prediabetes: Secondary | ICD-10-CM

## 2020-06-25 DIAGNOSIS — E559 Vitamin D deficiency, unspecified: Secondary | ICD-10-CM | POA: Diagnosis not present

## 2020-06-25 DIAGNOSIS — Z0001 Encounter for general adult medical examination with abnormal findings: Secondary | ICD-10-CM

## 2020-06-25 DIAGNOSIS — I10 Essential (primary) hypertension: Secondary | ICD-10-CM | POA: Diagnosis not present

## 2020-06-25 LAB — BAYER DCA HB A1C WAIVED: HB A1C (BAYER DCA - WAIVED): 5.9 % (ref <7.0)

## 2020-06-25 MED ORDER — HYDROCHLOROTHIAZIDE 25 MG PO TABS: 12.5000 mg | ORAL_TABLET | Freq: Every day | ORAL | 3 refills | Status: DC

## 2020-06-25 MED ORDER — LISINOPRIL 40 MG PO TABS: 40.0000 mg | ORAL_TABLET | Freq: Every day | ORAL | 3 refills | Status: DC

## 2020-06-25 NOTE — Progress Notes (Signed)
Subjective:  Patient ID: Anthony Austin, male    DOB: 1960/11/01  Age: 60 y.o. MRN: [id number]  CC: Annual Exam (/)   HPI Anthony Austin presents for CPE.  Patient is also treated for high blood pressure.He ran out of his medicine 4 to 6 weeks ago for blood pressure and could not get in until now for follow-up so his pressure is up as a result.  He has had a low vitamin D level in the past.  He has been off of the vitamin D for an extended period of time.  His cholesterol has been high.  He has remote history of prostate cancer with prostatectomy approximately 16 years ago.  He did not have radiation or chemo, instead he had a radical prostatectomy.  Depression screen [facility] 2/9 06/25/2020 02/13/2019 09/13/2017  Decreased Interest 0 0 0  Down, Depressed, Hopeless 0 0 0  PHQ - 2 Score 0 0 0    History Anthony Austin has a past medical history of Hyperlipidemia, Hypertension, and Prostate CA ([place]) (2005).   He has a past surgical history that includes Prostate surgery; Prostatectomy (2005); and Prostatectomy.   His family history includes Cancer in his father; Diabetes in his mother; Hypertension in his father and mother; Stroke in his mother.He reports that he has never smoked. He has never used smokeless tobacco. He reports that he does not drink alcohol and does not use drugs.    ROS Review of Systems  Constitutional: Negative for activity change, fatigue and unexpected weight change.  HENT: Negative for congestion, ear pain, hearing loss, postnasal drip and trouble swallowing.   Eyes: Negative for pain and visual disturbance.  Respiratory: Negative for cough, chest tightness and shortness of breath.   Cardiovascular: Negative for chest pain, palpitations and leg swelling.  Gastrointestinal: Negative for abdominal distention, abdominal pain, blood in stool, constipation, diarrhea, nausea and vomiting.  Endocrine: Negative for cold intolerance, heat intolerance and polydipsia.  Genitourinary: Negative  for difficulty urinating, dysuria, flank pain, frequency and urgency.  Musculoskeletal: Negative for arthralgias and joint swelling.  Skin: Negative for color change, rash and wound.  Neurological: Positive for numbness (in the toes for many years. Vit. B12 has been normal.). Negative for dizziness, syncope, speech difficulty, weakness, light-headedness and headaches.  Hematological: Does not bruise/bleed easily.  Psychiatric/Behavioral: Negative for confusion, decreased concentration, dysphoric mood and sleep disturbance. The patient is not nervous/anxious.     Objective:  BP (!) 166/102   Pulse 63   Temp 98 F (36.7 C) (Temporal)   Ht 5' 7.5" (1.715 m)   Wt 197 lb (89.4 kg)   BMI 30.40 kg/m   BP Readings from Last 3 Encounters:  06/25/20 (!) 166/102  02/13/19 (!) 142/94  09/13/17 119/74    Wt Readings from Last 3 Encounters:  06/25/20 197 lb (89.4 kg)  02/13/19 193 lb (87.5 kg)  09/13/17 178 lb 8 oz (81 kg)     Physical Exam Constitutional:      Appearance: He is well-developed and well-nourished.  HENT:     Head: Normocephalic and atraumatic.     Mouth/Throat:     Mouth: Oropharynx is clear and moist.  Eyes:     Extraocular Movements: EOM normal.     Pupils: Pupils are equal, round, and reactive to light.  Neck:     Thyroid: No thyromegaly.     Trachea: No tracheal deviation.  Cardiovascular:     Rate and Rhythm: Normal rate and regular rhythm.  Heart sounds: Normal heart sounds. No murmur heard. No friction rub. No gallop.   Pulmonary:     Breath sounds: Normal breath sounds. No wheezing or rales.  Abdominal:     General: Bowel sounds are normal. There is no distension.     Palpations: Abdomen is soft. There is no mass.     Tenderness: There is no abdominal tenderness.     Hernia: There is no hernia in the right inguinal area or left inguinal area.  Genitourinary:    Penis: Normal.      Testes: Normal.  Musculoskeletal:        General: No edema.  Normal range of motion.     Cervical back: Normal range of motion.  Lymphadenopathy:     Cervical: No cervical adenopathy.  Skin:    General: Skin is warm and dry.  Neurological:     Mental Status: He is alert and oriented to person, place, and time.  Psychiatric:        Mood and Affect: Mood and affect normal.       Assessment & Plan:   Anthony Austin was seen today for annual exam.  Diagnoses and all orders for this visit:  Well adult exam -     CBC with Differential/Platelet -     CMP14+EGFR -     Lipid panel -     Urinalysis -     VITAMIN D 25 Hydroxy (Vit-D Deficiency, Fractures) -     PSA, total and free  Essential hypertension -     CBC with Differential/Platelet -     CMP14+EGFR -     Lipid panel -     hydrochlorothiazide (HYDRODIURIL) 25 MG tablet; Take 0.5 tablets (12.5 mg total) by mouth daily. -     lisinopril (ZESTRIL) 40 MG tablet; Take 1 tablet (40 mg total) by mouth daily.  Prediabetes -     CBC with Differential/Platelet -     CMP14+EGFR -     Lipid panel -     Bayer DCA Hb A1c Waived  Vitamin D deficiency -     CBC with Differential/Platelet -     CMP14+EGFR -     VITAMIN D 25 Hydroxy (Vit-D Deficiency, Fractures)       I have discontinued [facility]. [facility]'s predniSONE, naproxen, and Vitamin D (Ergocalciferol). I am also having him maintain his hydrochlorothiazide and lisinopril.  Allergies as of 06/25/2020   No Known Allergies     Medication List       Accurate as of June 25, 2020  9:43 AM. If you have any questions, ask your nurse or doctor.        STOP taking these medications   naproxen 500 MG tablet Commonly known as: NAPROSYN Stopped by: Claretta Fraise, MD   predniSONE 20 MG tablet Commonly known as: Deltasone Stopped by: Claretta Fraise, MD   Vitamin D (Ergocalciferol) 1.25 MG (50000 UNIT) Caps capsule Commonly known as: DRISDOL Stopped by: Claretta Fraise, MD     TAKE these medications   hydrochlorothiazide 25 MG  tablet Commonly known as: HYDRODIURIL Take 0.5 tablets (12.5 mg total) by mouth daily.   lisinopril 40 MG tablet Commonly known as: ZESTRIL Take 1 tablet (40 mg total) by mouth daily.        Follow-up: Return in about 6 months (around 12/23/2020).  Claretta Fraise, M.D.

## 2020-06-26 LAB — CMP14+EGFR
ALT: 66 IU/L — ABNORMAL HIGH (ref 0–44)
AST: 42 IU/L — ABNORMAL HIGH (ref 0–40)
Albumin/Globulin Ratio: 2.4 — ABNORMAL HIGH (ref 1.2–2.2)
Albumin: 4.8 g/dL (ref 3.8–4.9)
Alkaline Phosphatase: 73 IU/L (ref 44–121)
BUN/Creatinine Ratio: 12 (ref 9–20)
BUN: 13 mg/dL (ref 6–24)
Bilirubin Total: 0.7 mg/dL (ref 0.0–1.2)
CO2: 26 mmol/L (ref 20–29)
Calcium: 9.5 mg/dL (ref 8.7–10.2)
Chloride: 100 mmol/L (ref 96–106)
Creatinine, Ser: 1.13 mg/dL (ref 0.76–1.27)
GFR calc Af Amer: 82 mL/min/{1.73_m2} (ref >59)
GFR calc non Af Amer: 71 mL/min/{1.73_m2} (ref >59)
Globulin, Total: 2 g/dL (ref 1.5–4.5)
Glucose: 103 mg/dL — ABNORMAL HIGH (ref 65–99)
Potassium: 4.3 mmol/L (ref 3.5–5.2)
Sodium: 139 mmol/L (ref 134–144)
Total Protein: 6.8 g/dL (ref 6.0–8.5)

## 2020-06-26 LAB — CBC WITH DIFFERENTIAL/PLATELET
Basophils Absolute: 0.1 10*3/uL (ref 0.0–0.2)
Basos: 1 %
EOS (ABSOLUTE): 0.1 10*3/uL (ref 0.0–0.4)
Eos: 2 %
Hematocrit: 50 % (ref 37.5–51.0)
Hemoglobin: 17.1 g/dL (ref 13.0–17.7)
Immature Grans (Abs): 0 10*3/uL (ref 0.0–0.1)
Immature Granulocytes: 0 %
Lymphocytes Absolute: 2 10*3/uL (ref 0.7–3.1)
Lymphs: 28 %
MCH: 32.3 pg (ref 26.6–33.0)
MCHC: 34.2 g/dL (ref 31.5–35.7)
MCV: 95 fL (ref 79–97)
Monocytes Absolute: 0.5 10*3/uL (ref 0.1–0.9)
Monocytes: 7 %
Neutrophils Absolute: 4.6 10*3/uL (ref 1.4–7.0)
Neutrophils: 62 %
Platelets: 255 10*3/uL (ref 150–450)
RBC: 5.29 x10E6/uL (ref 4.14–5.80)
RDW: 12 % (ref 11.6–15.4)
WBC: 7.3 10*3/uL (ref 3.4–10.8)

## 2020-06-26 LAB — LIPID PANEL
Chol/HDL Ratio: 5.6 ratio — ABNORMAL HIGH (ref 0.0–5.0)
Cholesterol, Total: 209 mg/dL — ABNORMAL HIGH (ref 100–199)
HDL: 37 mg/dL — ABNORMAL LOW (ref >39)
LDL Chol Calc (NIH): 137 mg/dL — ABNORMAL HIGH (ref 0–99)
Triglycerides: 192 mg/dL — ABNORMAL HIGH (ref 0–149)
VLDL Cholesterol Cal: 35 mg/dL (ref 5–40)

## 2020-06-26 LAB — URINALYSIS
Bilirubin, UA: NEGATIVE
Glucose, UA: NEGATIVE
Ketones, UA: NEGATIVE
Leukocytes,UA: NEGATIVE
Nitrite, UA: NEGATIVE
RBC, UA: NEGATIVE
Specific Gravity, UA: 1.02 (ref 1.005–1.030)
Urobilinogen, Ur: 0.2 mg/dL (ref 0.2–1.0)
pH, UA: 7 (ref 5.0–7.5)

## 2020-06-26 LAB — PSA, TOTAL AND FREE
PSA, Free Pct: 4.2 %
PSA, Free: 0.05 ng/mL
Prostate Specific Ag, Serum: 1.2 ng/mL (ref 0.0–4.0)

## 2020-06-26 LAB — VITAMIN D 25 HYDROXY (VIT D DEFICIENCY, FRACTURES): Vit D, 25-Hydroxy: 27.1 ng/mL — ABNORMAL LOW (ref 30.0–100.0)

## 2020-07-16 ENCOUNTER — Telehealth: Payer: Self-pay | Admitting: Family Medicine

## 2020-07-16 NOTE — Telephone Encounter (Signed)
Pt calling in to let us know that his BP has still been high and staying around 150/100 even with taking the new BP meds

## 2020-07-16 NOTE — Telephone Encounter (Signed)
Pt returned call.  I gave him provider instructions on the dose change and he verbalized understanding.

## 2020-07-16 NOTE — Telephone Encounter (Signed)
Have him start taking the HCTZ one whole pill a day (increase from 1/2)

## 2020-07-16 NOTE — Telephone Encounter (Signed)
Left message to call back  

## 2020-10-15 ENCOUNTER — Encounter: Payer: Self-pay | Admitting: *Deleted

## 2020-12-24 ENCOUNTER — Ambulatory Visit: Payer: BC Managed Care – PPO | Admitting: Family Medicine

## 2020-12-25 ENCOUNTER — Encounter: Payer: Self-pay | Admitting: Family Medicine

## 2020-12-25 ENCOUNTER — Other Ambulatory Visit: Payer: Self-pay

## 2020-12-25 ENCOUNTER — Ambulatory Visit (INDEPENDENT_AMBULATORY_CARE_PROVIDER_SITE_OTHER): Payer: BC Managed Care – PPO | Admitting: Family Medicine

## 2020-12-25 VITALS — BP 124/76 | HR 64 | Temp 98.1°F | Ht 67.5 in | Wt 192.0 lb

## 2020-12-25 DIAGNOSIS — I10 Essential (primary) hypertension: Secondary | ICD-10-CM

## 2020-12-25 DIAGNOSIS — R7303 Prediabetes: Secondary | ICD-10-CM

## 2020-12-25 DIAGNOSIS — E559 Vitamin D deficiency, unspecified: Secondary | ICD-10-CM

## 2020-12-25 DIAGNOSIS — E785 Hyperlipidemia, unspecified: Secondary | ICD-10-CM

## 2020-12-25 DIAGNOSIS — R202 Paresthesia of skin: Secondary | ICD-10-CM

## 2020-12-25 DIAGNOSIS — L84 Corns and callosities: Secondary | ICD-10-CM

## 2020-12-25 LAB — BAYER DCA HB A1C WAIVED: HB A1C (BAYER DCA - WAIVED): 5.5 % (ref <7.0)

## 2020-12-25 MED ORDER — HYDROCHLOROTHIAZIDE 25 MG PO TABS: 25.0000 mg | ORAL_TABLET | Freq: Every day | ORAL | 3 refills | Status: DC

## 2020-12-25 MED ORDER — LISINOPRIL 40 MG PO TABS: 40.0000 mg | ORAL_TABLET | Freq: Every day | ORAL | 3 refills | Status: DC

## 2020-12-25 NOTE — Progress Notes (Signed)
Subjective:  Patient ID: Anthony Austin, male    DOB: Jan 16, 1961  Age: 60 y.o. MRN: [id number]  CC: Medical Management of Chronic Issues   HPI Anthony Austin presents for  presents for  follow-up of hypertension. Patient has no history of headache chest pain or shortness of breath or recent cough. Patient also denies symptoms of TIA such as focal numbness or weakness. Patient denies side effects from medication. States taking it regularly.  Also has had borderline DM. Due for A1c to check for progression.    Depression screen [facility] 2/9 12/25/2020 06/25/2020 02/13/2019  Decreased Interest 0 0 0  Down, Depressed, Hopeless 0 0 0  PHQ - 2 Score 0 0 0    History [name] has a past medical history of Hyperlipidemia, Hypertension, and Prostate CA ([place]) (2005).   He has a past surgical history that includes Prostate surgery; Prostatectomy (2005); and Prostatectomy.   His family history includes Cancer in his father; Diabetes in his mother; Hypertension in his father and mother; Stroke in his mother.He reports that he has never smoked. He has never used smokeless tobacco. He reports that he does not drink alcohol and does not use drugs.    ROS Review of Systems  Constitutional: Negative.   HENT: Negative.    Eyes:  Negative for visual disturbance.  Respiratory:  Negative for cough and shortness of breath.   Cardiovascular:  Negative for chest pain and leg swelling.  Gastrointestinal:  Negative for abdominal pain, diarrhea, nausea and vomiting.  Genitourinary:  Negative for difficulty urinating.  Musculoskeletal:  Negative for arthralgias and myalgias.  Skin:  Negative for rash.  Neurological:  Negative for headaches.  Psychiatric/Behavioral:  Negative for sleep disturbance.    Objective:  BP 124/76   Pulse 64   Temp 98.1 F (36.7 C)   Ht 5' 7.5" (1.715 m)   Wt 192 lb (87.1 kg)   SpO2 97%   BMI 29.63 kg/m   BP Readings from Last 3 Encounters:  12/25/20 124/76  06/25/20 (!) 166/102   02/13/19 (!) 142/94    Wt Readings from Last 3 Encounters:  12/25/20 192 lb (87.1 kg)  06/25/20 197 lb (89.4 kg)  02/13/19 193 lb (87.5 kg)     Physical Exam Vitals reviewed.  Constitutional:      Appearance: He is well-developed.  HENT:     Head: Normocephalic and atraumatic.     Right Ear: External ear normal.     Left Ear: External ear normal.     Mouth/Throat:     Pharynx: No oropharyngeal exudate or posterior oropharyngeal erythema.  Eyes:     Pupils: Pupils are equal, round, and reactive to light.  Cardiovascular:     Rate and Rhythm: Normal rate and regular rhythm.     Heart sounds: No murmur heard. Pulmonary:     Effort: No respiratory distress.     Breath sounds: Normal breath sounds.  Musculoskeletal:     Cervical back: Normal range of motion and neck supple.  Neurological:     Mental Status: He is alert and oriented to person, place, and time.      Assessment & Plan:   Takota was seen today for medical management of chronic issues.  Diagnoses and all orders for this visit:  Hyperlipidemia, unspecified hyperlipidemia type -     Lipid panel  Essential hypertension -     CMP14+EGFR -     hydrochlorothiazide (HYDRODIURIL) 25 MG tablet; Take 1 tablet (25 mg  total) by mouth daily. -     lisinopril (ZESTRIL) 40 MG tablet; Take 1 tablet (40 mg total) by mouth daily.  Vitamin D deficiency -     VITAMIN D 25 Hydroxy (Vit-D Deficiency, Fractures)  Prediabetes -     Bayer DCA Hb A1c Waived -     CBC with Differential/Platelet  Corn of foot -     Ambulatory referral to Podiatry  Paresthesia of both feet      I have changed Anthony Austin's hydrochlorothiazide. I am also having him maintain his lisinopril.  Allergies as of 12/25/2020   No Known Allergies      Medication List        Accurate as of December 25, 2020  9:57 PM. If you have any questions, ask your nurse or doctor.          hydrochlorothiazide 25 MG tablet Commonly known as:  HYDRODIURIL Take 1 tablet (25 mg total) by mouth daily. What changed: how much to take Changed by: Claretta Fraise, MD   lisinopril 40 MG tablet Commonly known as: ZESTRIL Take 1 tablet (40 mg total) by mouth daily.         Follow-up: Return in about 6 months (around 06/27/2021).  Claretta Fraise, M.D.

## 2020-12-26 LAB — CBC WITH DIFFERENTIAL/PLATELET
Basophils Absolute: 0 10*3/uL (ref 0.0–0.2)
Basos: 1 %
EOS (ABSOLUTE): 0.1 10*3/uL (ref 0.0–0.4)
Eos: 2 %
Hematocrit: 44.7 % (ref 37.5–51.0)
Hemoglobin: 15.7 g/dL (ref 13.0–17.7)
Immature Grans (Abs): 0 10*3/uL (ref 0.0–0.1)
Immature Granulocytes: 0 %
Lymphocytes Absolute: 2.7 10*3/uL (ref 0.7–3.1)
Lymphs: 41 %
MCH: 33.3 pg — ABNORMAL HIGH (ref 26.6–33.0)
MCHC: 35.1 g/dL (ref 31.5–35.7)
MCV: 95 fL (ref 79–97)
Monocytes Absolute: 0.5 10*3/uL (ref 0.1–0.9)
Monocytes: 7 %
Neutrophils Absolute: 3.2 10*3/uL (ref 1.4–7.0)
Neutrophils: 49 %
Platelets: 228 10*3/uL (ref 150–450)
RBC: 4.72 x10E6/uL (ref 4.14–5.80)
RDW: 11.9 % (ref 11.6–15.4)
WBC: 6.6 10*3/uL (ref 3.4–10.8)

## 2020-12-26 LAB — CMP14+EGFR
ALT: 41 IU/L (ref 0–44)
AST: 32 IU/L (ref 0–40)
Albumin/Globulin Ratio: 2.3 — ABNORMAL HIGH (ref 1.2–2.2)
Albumin: 4.5 g/dL (ref 3.8–4.9)
Alkaline Phosphatase: 69 IU/L (ref 44–121)
BUN/Creatinine Ratio: 14 (ref 10–24)
BUN: 17 mg/dL (ref 8–27)
Bilirubin Total: 0.3 mg/dL (ref 0.0–1.2)
CO2: 27 mmol/L (ref 20–29)
Calcium: 9.8 mg/dL (ref 8.6–10.2)
Chloride: 100 mmol/L (ref 96–106)
Creatinine, Ser: 1.21 mg/dL (ref 0.76–1.27)
Globulin, Total: 2 g/dL (ref 1.5–4.5)
Glucose: 101 mg/dL — ABNORMAL HIGH (ref 65–99)
Potassium: 4.5 mmol/L (ref 3.5–5.2)
Sodium: 143 mmol/L (ref 134–144)
Total Protein: 6.5 g/dL (ref 6.0–8.5)
eGFR: 69 mL/min/{1.73_m2} (ref >59)

## 2020-12-26 LAB — LIPID PANEL
Chol/HDL Ratio: 6.4 ratio — ABNORMAL HIGH (ref 0.0–5.0)
Cholesterol, Total: 205 mg/dL — ABNORMAL HIGH (ref 100–199)
HDL: 32 mg/dL — ABNORMAL LOW (ref >39)
LDL Chol Calc (NIH): 102 mg/dL — ABNORMAL HIGH (ref 0–99)
Triglycerides: 416 mg/dL — ABNORMAL HIGH (ref 0–149)
VLDL Cholesterol Cal: 71 mg/dL — ABNORMAL HIGH (ref 5–40)

## 2020-12-26 LAB — VITAMIN D 25 HYDROXY (VIT D DEFICIENCY, FRACTURES): Vit D, 25-Hydroxy: 24.2 ng/mL — ABNORMAL LOW (ref 30.0–100.0)

## 2020-12-30 ENCOUNTER — Other Ambulatory Visit: Payer: Self-pay | Admitting: Family Medicine

## 2020-12-30 MED ORDER — ATORVASTATIN CALCIUM 40 MG PO TABS: 40.0000 mg | ORAL_TABLET | Freq: Every day | ORAL | 3 refills | Status: DC

## 2021-01-21 ENCOUNTER — Encounter: Payer: Self-pay | Admitting: Gastroenterology

## 2021-02-06 ENCOUNTER — Encounter: Payer: Self-pay | Admitting: *Deleted

## 2021-07-10 ENCOUNTER — Ambulatory Visit (INDEPENDENT_AMBULATORY_CARE_PROVIDER_SITE_OTHER): Payer: BC Managed Care – PPO | Admitting: Family Medicine

## 2021-07-10 ENCOUNTER — Encounter: Payer: Self-pay | Admitting: Family Medicine

## 2021-07-10 VITALS — BP 132/77 | HR 70 | Temp 97.8°F | Ht 67.5 in | Wt 198.0 lb

## 2021-07-10 DIAGNOSIS — R7303 Prediabetes: Secondary | ICD-10-CM | POA: Diagnosis not present

## 2021-07-10 DIAGNOSIS — E785 Hyperlipidemia, unspecified: Secondary | ICD-10-CM | POA: Diagnosis not present

## 2021-07-10 DIAGNOSIS — I10 Essential (primary) hypertension: Secondary | ICD-10-CM | POA: Diagnosis not present

## 2021-07-10 DIAGNOSIS — Z125 Encounter for screening for malignant neoplasm of prostate: Secondary | ICD-10-CM | POA: Diagnosis not present

## 2021-07-10 NOTE — Progress Notes (Signed)
Subjective:  Patient ID: Anthony Austin, male    DOB: [birth year]  Age: 61 y.o. MRN: [id number]  CC: Medical Management of Chronic Issues   HPI Anthony Austin presents for follow-up of elevated cholesterol. Doing well without complaints on current medication. Denies side effects of statin including myalgia and arthralgia and nausea. Also in today for liver function testing. Currently no chest pain, shortness of breath or other cardiovascular related symptoms noted. Had pizza 4 hours before blood was drawn.    presents for  follow-up of hypertension. Patient has no history of headache chest pain or shortness of breath or recent cough. Patient also denies symptoms of TIA such as focal numbness or weakness. Patient denies side effects from medication. States taking it regularly.  History Anthony Austin has a past medical history of Hyperlipidemia, Hypertension, and Prostate CA ([place]) (2005).   Anthony Austin has a past surgical history that includes Prostate surgery; Prostatectomy (2005); and Prostatectomy.   His family history includes Cancer in his father; Diabetes in his mother; Hypertension in his father and mother; Stroke in his mother.Anthony Austin reports that Anthony Austin has never smoked. Anthony Austin has never used smokeless tobacco. Anthony Austin reports that Anthony Austin does not drink alcohol and does not use drugs.  Current Outpatient Medications on File Prior to Visit  Medication Sig Dispense Refill   atorvastatin (LIPITOR) 40 MG tablet Take 1 tablet (40 mg total) by mouth daily. For cholesterol 90 tablet 3   hydrochlorothiazide (HYDRODIURIL) 25 MG tablet Take 1 tablet (25 mg total) by mouth daily. 90 tablet 3   lisinopril (ZESTRIL) 40 MG tablet Take 1 tablet (40 mg total) by mouth daily. 90 tablet 3   No current facility-administered medications on file prior to visit.    ROS Review of Systems  Constitutional:  Negative for fever.  Respiratory:  Negative for shortness of breath.   Cardiovascular:  Negative for chest pain.  Musculoskeletal:   Negative for arthralgias.  Skin:  Negative for rash.   Objective:  BP 132/77    Pulse 70    Temp 97.8 F (36.6 C)    Ht 5' 7.5" (1.715 m)    Wt 198 lb (89.8 kg)    SpO2 96%    BMI 30.55 kg/m   BP Readings from Last 3 Encounters:  07/10/21 132/77  12/25/20 124/76  06/25/20 (!) 166/102    Wt Readings from Last 3 Encounters:  07/10/21 198 lb (89.8 kg)  12/25/20 192 lb (87.1 kg)  06/25/20 197 lb (89.4 kg)     Physical Exam Vitals reviewed.  Constitutional:      Appearance: Anthony Austin is well-developed.  HENT:     Head: Normocephalic and atraumatic.     Right Ear: External ear normal.     Left Ear: External ear normal.     Mouth/Throat:     Pharynx: No oropharyngeal exudate or posterior oropharyngeal erythema.  Eyes:     Pupils: Pupils are equal, round, and reactive to light.  Cardiovascular:     Rate and Rhythm: Normal rate and regular rhythm.     Heart sounds: No murmur heard. Pulmonary:     Effort: No respiratory distress.     Breath sounds: Normal breath sounds.  Musculoskeletal:     Cervical back: Normal range of motion and neck supple.  Neurological:     Mental Status: Anthony Austin is alert and oriented to person, place, and time.    Lab Results  Component Value Date   HGBA1C 6.3 (H) 07/10/2021   HGBA1C  5.5 12/25/2020   HGBA1C 5.9 06/25/2020    Lab Results  Component Value Date   WBC 7.2 07/10/2021   HGB 16.2 07/10/2021   HCT 46.4 07/10/2021   PLT 241 07/10/2021   GLUCOSE 138 (H) 07/10/2021   CHOL 112 07/10/2021   TRIG 200 (H) 07/10/2021   HDL 31 (L) 07/10/2021   LDLCALC 48 07/10/2021   ALT 51 (H) 07/10/2021   AST 30 07/10/2021   NA 139 07/10/2021   K 4.3 07/10/2021   CL 97 07/10/2021   CREATININE 1.21 07/10/2021   BUN 22 07/10/2021   CO2 29 07/10/2021   TSH 2.530 02/13/2019   PSA 0.9 09/20/2014   HGBA1C 6.3 (H) 07/10/2021    No results found.  Assessment & Plan:   Anthony Austin was seen today for medical management of chronic issues.  Diagnoses and all orders  for this visit:  Hyperlipidemia, unspecified hyperlipidemia type -     Lipid panel  Essential hypertension -     CBC with Differential/Platelet -     CMP14+EGFR  Prediabetes -     Bayer DCA Hb A1c Waived  Prostate cancer screening -     PSA, total and free   I am having Anthony Austin maintain his hydrochlorothiazide, lisinopril, and atorvastatin.  No orders of the defined types were placed in this encounter.    Follow-up: No follow-ups on file.  Claretta Fraise, M.D.

## 2021-07-11 ENCOUNTER — Other Ambulatory Visit: Payer: Self-pay | Admitting: Family Medicine

## 2021-07-11 ENCOUNTER — Ambulatory Visit: Payer: BC Managed Care – PPO | Admitting: Family Medicine

## 2021-07-11 LAB — CBC WITH DIFFERENTIAL/PLATELET
Basophils Absolute: 0 10*3/uL (ref 0.0–0.2)
Basos: 1 %
EOS (ABSOLUTE): 0.3 10*3/uL (ref 0.0–0.4)
Eos: 4 %
Hematocrit: 46.4 % (ref 37.5–51.0)
Hemoglobin: 16.2 g/dL (ref 13.0–17.7)
Immature Grans (Abs): 0 10*3/uL (ref 0.0–0.1)
Immature Granulocytes: 0 %
Lymphocytes Absolute: 2.5 10*3/uL (ref 0.7–3.1)
Lymphs: 34 %
MCH: 33 pg (ref 26.6–33.0)
MCHC: 34.9 g/dL (ref 31.5–35.7)
MCV: 95 fL (ref 79–97)
Monocytes Absolute: 0.6 10*3/uL (ref 0.1–0.9)
Monocytes: 8 %
Neutrophils Absolute: 3.9 10*3/uL (ref 1.4–7.0)
Neutrophils: 53 %
Platelets: 241 10*3/uL (ref 150–450)
RBC: 4.91 x10E6/uL (ref 4.14–5.80)
RDW: 12.4 % (ref 11.6–15.4)
WBC: 7.2 10*3/uL (ref 3.4–10.8)

## 2021-07-11 LAB — CMP14+EGFR
ALT: 51 IU/L — ABNORMAL HIGH (ref 0–44)
AST: 30 IU/L (ref 0–40)
Albumin/Globulin Ratio: 2.6 — ABNORMAL HIGH (ref 1.2–2.2)
Albumin: 4.7 g/dL (ref 3.8–4.8)
Alkaline Phosphatase: 81 IU/L (ref 44–121)
BUN/Creatinine Ratio: 18 (ref 10–24)
BUN: 22 mg/dL (ref 8–27)
Bilirubin Total: 0.5 mg/dL (ref 0.0–1.2)
CO2: 29 mmol/L (ref 20–29)
Calcium: 10 mg/dL (ref 8.6–10.2)
Chloride: 97 mmol/L (ref 96–106)
Creatinine, Ser: 1.21 mg/dL (ref 0.76–1.27)
Globulin, Total: 1.8 g/dL (ref 1.5–4.5)
Glucose: 138 mg/dL — ABNORMAL HIGH (ref 70–99)
Potassium: 4.3 mmol/L (ref 3.5–5.2)
Sodium: 139 mmol/L (ref 134–144)
Total Protein: 6.5 g/dL (ref 6.0–8.5)
eGFR: 68 mL/min/{1.73_m2} (ref >59)

## 2021-07-11 LAB — LIPID PANEL
Chol/HDL Ratio: 3.6 ratio (ref 0.0–5.0)
Cholesterol, Total: 112 mg/dL (ref 100–199)
HDL: 31 mg/dL — ABNORMAL LOW (ref >39)
LDL Chol Calc (NIH): 48 mg/dL (ref 0–99)
Triglycerides: 200 mg/dL — ABNORMAL HIGH (ref 0–149)
VLDL Cholesterol Cal: 33 mg/dL (ref 5–40)

## 2021-07-11 LAB — PSA, TOTAL AND FREE
PSA, Free Pct: 4.6 %
PSA, Free: 0.06 ng/mL
Prostate Specific Ag, Serum: 1.3 ng/mL (ref 0.0–4.0)

## 2021-07-11 LAB — BAYER DCA HB A1C WAIVED: HB A1C (BAYER DCA - WAIVED): 6.3 % — ABNORMAL HIGH (ref 4.8–5.6)

## 2021-07-11 MED ORDER — METFORMIN HCL ER 500 MG PO TB24: 500.0000 mg | ORAL_TABLET | Freq: Every day | ORAL | 2 refills | Status: DC

## 2021-07-12 ENCOUNTER — Encounter: Payer: Self-pay | Admitting: Family Medicine

## 2021-07-31 ENCOUNTER — Encounter: Payer: Self-pay | Admitting: *Deleted

## 2021-09-25 ENCOUNTER — Emergency Department (HOSPITAL_COMMUNITY)
Admission: EM | Admit: 2021-09-25 | Discharge: 2021-09-25 | Disposition: A | Discharge status: Home / Self Care | Payer: BC Managed Care – PPO | Attending: Emergency Medicine | Admitting: Emergency Medicine

## 2021-09-25 ENCOUNTER — Encounter (HOSPITAL_COMMUNITY): Payer: Self-pay | Admitting: *Deleted

## 2021-09-25 ENCOUNTER — Encounter: Payer: Self-pay | Admitting: Family Medicine

## 2021-09-25 ENCOUNTER — Emergency Department (HOSPITAL_COMMUNITY): Payer: BC Managed Care – PPO

## 2021-09-25 ENCOUNTER — Ambulatory Visit: Payer: BC Managed Care – PPO

## 2021-09-25 ENCOUNTER — Other Ambulatory Visit: Payer: Self-pay

## 2021-09-25 ENCOUNTER — Ambulatory Visit (INDEPENDENT_AMBULATORY_CARE_PROVIDER_SITE_OTHER): Payer: BC Managed Care – PPO | Admitting: Family Medicine

## 2021-09-25 VITALS — BP 136/88 | HR 92 | Temp 101.3°F | Ht 67.5 in | Wt 198.2 lb

## 2021-09-25 DIAGNOSIS — R102 Pelvic and perineal pain: Secondary | ICD-10-CM | POA: Diagnosis not present

## 2021-09-25 DIAGNOSIS — K5792 Diverticulitis of intestine, part unspecified, without perforation or abscess without bleeding: Secondary | ICD-10-CM | POA: Diagnosis not present

## 2021-09-25 DIAGNOSIS — Z79899 Other long term (current) drug therapy: Secondary | ICD-10-CM | POA: Insufficient documentation

## 2021-09-25 DIAGNOSIS — R1033 Periumbilical pain: Secondary | ICD-10-CM | POA: Diagnosis not present

## 2021-09-25 DIAGNOSIS — R5081 Fever presenting with conditions classified elsewhere: Secondary | ICD-10-CM | POA: Diagnosis not present

## 2021-09-25 DIAGNOSIS — R110 Nausea: Secondary | ICD-10-CM | POA: Insufficient documentation

## 2021-09-25 DIAGNOSIS — R1030 Lower abdominal pain, unspecified: Secondary | ICD-10-CM | POA: Diagnosis not present

## 2021-09-25 DIAGNOSIS — R509 Fever, unspecified: Secondary | ICD-10-CM

## 2021-09-25 DIAGNOSIS — I700 Atherosclerosis of aorta: Secondary | ICD-10-CM | POA: Diagnosis not present

## 2021-09-25 DIAGNOSIS — E876 Hypokalemia: Secondary | ICD-10-CM | POA: Diagnosis not present

## 2021-09-25 DIAGNOSIS — I10 Essential (primary) hypertension: Secondary | ICD-10-CM | POA: Diagnosis not present

## 2021-09-25 DIAGNOSIS — R300 Dysuria: Secondary | ICD-10-CM | POA: Diagnosis not present

## 2021-09-25 LAB — CBC
HCT: 43.7 % (ref 39.0–52.0)
Hemoglobin: 15.4 g/dL (ref 13.0–17.0)
MCH: 33.1 pg (ref 26.0–34.0)
MCHC: 35.2 g/dL (ref 30.0–36.0)
MCV: 94 fL (ref 80.0–100.0)
Platelets: 219 10*3/uL (ref 150–400)
RBC: 4.65 MIL/uL (ref 4.22–5.81)
RDW: 12.1 % (ref 11.5–15.5)
WBC: 13.7 10*3/uL — ABNORMAL HIGH (ref 4.0–10.5)
nRBC: 0 % (ref 0.0–0.2)

## 2021-09-25 LAB — COMPREHENSIVE METABOLIC PANEL
ALT: 39 U/L (ref 0–44)
AST: 23 U/L (ref 15–41)
Albumin: 4.3 g/dL (ref 3.5–5.0)
Alkaline Phosphatase: 85 U/L (ref 38–126)
Anion gap: 10 (ref 5–15)
BUN: 20 mg/dL (ref 8–23)
CO2: 28 mmol/L (ref 22–32)
Calcium: 9.1 mg/dL (ref 8.9–10.3)
Chloride: 100 mmol/L (ref 98–111)
Creatinine, Ser: 1.12 mg/dL (ref 0.61–1.24)
GFR, Estimated: >60 mL/min (ref >60)
Glucose, Bld: 108 mg/dL — ABNORMAL HIGH (ref 70–99)
Potassium: 3.1 mmol/L — ABNORMAL LOW (ref 3.5–5.1)
Sodium: 138 mmol/L (ref 135–145)
Total Bilirubin: 1.2 mg/dL (ref 0.3–1.2)
Total Protein: 7.5 g/dL (ref 6.5–8.1)

## 2021-09-25 LAB — URINALYSIS, ROUTINE W REFLEX MICROSCOPIC
Bilirubin, UA: NEGATIVE
Glucose, UA: NEGATIVE
Leukocytes,UA: NEGATIVE
Nitrite, UA: NEGATIVE
RBC, UA: NEGATIVE
Specific Gravity, UA: 1.025 (ref 1.005–1.030)
Urobilinogen, Ur: 0.2 mg/dL (ref 0.2–1.0)
pH, UA: 5.5 (ref 5.0–7.5)

## 2021-09-25 LAB — MICROSCOPIC EXAMINATION
Bacteria, UA: NONE SEEN
Epithelial Cells (non renal): NONE SEEN /hpf (ref 0–10)
RBC, Urine: NONE SEEN /hpf (ref 0–2)
Renal Epithel, UA: NONE SEEN /hpf
WBC, UA: NONE SEEN /hpf (ref 0–5)

## 2021-09-25 MED ORDER — IBUPROFEN 400 MG PO TABS
400.0000 mg | ORAL_TABLET | Freq: Once | ORAL | Status: AC
  Administered 2021-09-25: 400 mg via ORAL
  Filled 2021-09-25: qty 1

## 2021-09-25 MED ORDER — IOHEXOL 300 MG/ML  SOLN
100.0000 mL | Freq: Once | INTRAMUSCULAR | Status: AC | PRN
  Administered 2021-09-25: 100 mL via INTRAVENOUS

## 2021-09-25 MED ORDER — POTASSIUM CHLORIDE CRYS ER 20 MEQ PO TBCR: 20.0000 meq | EXTENDED_RELEASE_TABLET | Freq: Every day | ORAL | 0 refills | Status: DC

## 2021-09-25 MED ORDER — ACETAMINOPHEN 500 MG PO TABS
1000.0000 mg | ORAL_TABLET | Freq: Once | ORAL | Status: AC
  Administered 2021-09-25: 1000 mg via ORAL
  Filled 2021-09-25: qty 2

## 2021-09-25 MED ORDER — SODIUM CHLORIDE 0.9 % IV BOLUS
1000.0000 mL | Freq: Once | INTRAVENOUS | Status: AC
Start: 2021-09-25 — End: 2021-09-25
  Administered 2021-09-25: 1000 mL via INTRAVENOUS

## 2021-09-25 MED ORDER — POTASSIUM CHLORIDE CRYS ER 20 MEQ PO TBCR
40.0000 meq | EXTENDED_RELEASE_TABLET | Freq: Once | ORAL | Status: AC
Start: 2021-09-25 — End: 2021-09-25
  Administered 2021-09-25: 40 meq via ORAL
  Filled 2021-09-25: qty 2

## 2021-09-25 MED ORDER — PIPERACILLIN-TAZOBACTAM 3.375 G IVPB 30 MIN
3.3750 g | Freq: Once | INTRAVENOUS | Status: AC
  Administered 2021-09-25: 3.375 g via INTRAVENOUS
  Filled 2021-09-25: qty 50

## 2021-09-25 MED ORDER — AMOXICILLIN-POT CLAVULANATE 875-125 MG PO TABS: 1.0000 | ORAL_TABLET | Freq: Two times a day (BID) | ORAL | 0 refills | Status: DC

## 2021-09-25 NOTE — ED Notes (Signed)
Tolerates po fluids ? ?

## 2021-09-25 NOTE — ED Triage Notes (Signed)
Abdominal pain with intermittent fever ?

## 2021-09-25 NOTE — Discharge Instructions (Addendum)
It was our pleasure to provide your ER care today - we hope that you feel better. ? ?Take antibiotic (augmentin) as prescribed.  ? ?Drink plenty of fluids/stay well hydrated.  ? ?Take acetaminophen or ibuprofen as need. ? ?From the lab tests, your potassium level is mildly low - make sure to eat plenty of fruits and vegetables, take potassium supplement as prescribed. and follow up with primary care doctor in 1-2 weeks.  ? ?Follow up with primary care doctor next week if symptoms fail to improve/resolve. ? ?Return to ER if worse, new symptoms, worsening/severe pain, persistent vomiting, or other concern.  ?

## 2021-09-25 NOTE — ED Provider Notes (Signed)
?[facility] ?Provider Note ? ? ?CSN: [id number] ?Arrival date & time: 09/25/21  1654 ? ?  ? ?History ? ?Chief Complaint  ?Patient presents with  ? Abdominal Pain  ? ? ?Anthony Austin is a 62 y.o. male. ? ?Pt with lower abd pain in the past four days. Symptoms acute onset, moderate, constant, dull, non radiating. No hx same pain. No vomiting. No diarrhea. Had small bm today. Remote hx prostatectomy, no other abd surgery. No dysuria or hematuria. No scrotal or testicular pain. No back/flank pain. +fever. Went to pcp office today, and was to have ct tomorrow, but then decided to send to ED.   ? ?The history is provided by the patient and medical records.  ?Abdominal Pain ?Associated symptoms: fever and nausea   ?Associated symptoms: no chest pain, no cough, no dysuria, no hematuria, no shortness of breath and no sore throat   ? ?  ? ?Home Medications ?Prior to Admission medications   ?Medication Sig Start Date End Date Taking? Authorizing Provider  ?atorvastatin (LIPITOR) 40 MG tablet Take 1 tablet (40 mg total) by mouth daily. For cholesterol 12/30/20   Claretta Fraise, MD  ?hydrochlorothiazide (HYDRODIURIL) 25 MG tablet Take 1 tablet (25 mg total) by mouth daily. 12/25/20   Claretta Fraise, MD  ?lisinopril (ZESTRIL) 40 MG tablet Take 1 tablet (40 mg total) by mouth daily. 12/25/20 03/25/21  Claretta Fraise, MD  ?metFORMIN (GLUCOPHAGE-XR) 500 MG 24 hr tablet Take 1 tablet (500 mg total) by mouth daily with breakfast. 07/11/21   Claretta Fraise, MD  ?   ? ?Allergies    ?Patient has no known allergies.   ? ?Review of Systems   ?Review of Systems  ?Constitutional:  Positive for appetite change and fever.  ?HENT:  Negative for sore throat.   ?Eyes:  Negative for redness.  ?Respiratory:  Negative for cough and shortness of breath.   ?Cardiovascular:  Negative for chest pain.  ?Gastrointestinal:  Positive for abdominal pain and nausea.  ?Genitourinary:  Negative for dysuria, flank pain, hematuria and testicular pain.   ?Musculoskeletal:  Negative for back pain.  ?Skin:  Negative for rash.  ?Neurological:  Negative for headaches.  ?Hematological:  Does not bruise/bleed easily.  ?Psychiatric/Behavioral:  Negative for confusion.   ? ?Physical Exam ?Updated Vital Signs ?BP (!) 138/103 (BP Location: Right Arm)   Pulse 89   Temp 98.9 ?F (37.2 ?C) (Oral)   Resp 17   Ht 1.702 m ('5\' 7"'$ )   Wt 88.5 kg   SpO2 96%   BMI 30.54 kg/m?  ?Physical Exam ?Vitals and nursing note reviewed.  ?Constitutional:   ?   Appearance: Normal appearance. He is well-developed.  ?HENT:  ?   Head: Atraumatic.  ?   Nose: Nose normal.  ?   Mouth/Throat:  ?   Mouth: Mucous membranes are moist.  ?Eyes:  ?   General: No scleral icterus. ?   Conjunctiva/sclera: Conjunctivae normal.  ?Neck:  ?   Trachea: No tracheal deviation.  ?Cardiovascular:  ?   Rate and Rhythm: Normal rate and regular rhythm.  ?   Pulses: Normal pulses.  ?   Heart sounds: Normal heart sounds. No murmur heard. ?  No friction rub. No gallop.  ?Pulmonary:  ?   Effort: Pulmonary effort is normal. No accessory muscle usage or respiratory distress.  ?   Breath sounds: Normal breath sounds.  ?Abdominal:  ?   General: Bowel sounds are normal. There is no distension.  ?  Palpations: Abdomen is soft.  ?   Tenderness: There is abdominal tenderness. There is no guarding.  ?   Comments: Suprapubic/lower abd tenderness.   ?Genitourinary: ?   Comments: No cva tenderness. Normal external gu exam - no scrotal or testicular pain, swelling or tenderness.  ?Musculoskeletal:     ?   General: No swelling.  ?   Cervical back: Normal range of motion and neck supple. No rigidity.  ?Skin: ?   General: Skin is warm and dry.  ?   Findings: No rash.  ?Neurological:  ?   Mental Status: He is alert.  ?   Comments: Alert, speech clear.   ?Psychiatric:     ?   Mood and Affect: Mood normal.  ? ? ?ED Results / Procedures / Treatments   ?Labs ?(all labs ordered are listed, but only abnormal results are displayed) ?Results for  orders placed or performed during the hospital encounter of 09/25/21  ?CBC  ?Result Value Ref Range  ? WBC 13.7 (H) 4.0 - 10.5 K/uL  ? RBC 4.65 4.22 - 5.81 MIL/uL  ? Hemoglobin 15.4 13.0 - 17.0 g/dL  ? HCT 43.7 39.0 - 52.0 %  ? MCV 94.0 80.0 - 100.0 fL  ? MCH 33.1 26.0 - 34.0 pg  ? MCHC 35.2 30.0 - 36.0 g/dL  ? RDW 12.1 11.5 - 15.5 %  ? Platelets 219 150 - 400 K/uL  ? nRBC 0.0 0.0 - 0.2 %  ?Comprehensive metabolic panel  ?Result Value Ref Range  ? Sodium 138 135 - 145 mmol/L  ? Potassium 3.1 (L) 3.5 - 5.1 mmol/L  ? Chloride 100 98 - 111 mmol/L  ? CO2 28 22 - 32 mmol/L  ? Glucose, Bld 108 (H) 70 - 99 mg/dL  ? BUN 20 8 - 23 mg/dL  ? Creatinine, Ser 1.12 0.61 - 1.24 mg/dL  ? Calcium 9.1 8.9 - 10.3 mg/dL  ? Total Protein 7.5 6.5 - 8.1 g/dL  ? Albumin 4.3 3.5 - 5.0 g/dL  ? AST 23 15 - 41 U/L  ? ALT 39 0 - 44 U/L  ? Alkaline Phosphatase 85 38 - 126 U/L  ? Total Bilirubin 1.2 0.3 - 1.2 mg/dL  ? GFR, Estimated >60 >60 mL/min  ? Anion gap 10 5 - 15  ? ? ? ?EKG ?None ? ?Radiology ?No results found. ? ?Procedures ?Procedures  ? ? ?Medications Ordered in ED ?Medications  ?sodium chloride 0.9 % bolus 1,000 mL (has no administration in time range)  ? ? ?ED Course/ Medical Decision Making/ A&P ?  ?                        ?Medical Decision Making ?Problems Addressed: ?Acute diverticulitis: acute illness or injury with systemic symptoms that poses a threat to life or bodily functions ?Acute febrile illness: acute illness or injury ?Essential hypertension: chronic illness or injury ?Lower abdominal pain: acute illness or injury ? ?Amount and/or Complexity of Data Reviewed ?External Data Reviewed: notes. ?Labs: ordered. Decision-making details documented in ED Course. ?Radiology: ordered and independent interpretation performed. Decision-making details documented in ED Course. ? ?Risk ?Prescription drug management. ? ? ?Iv ns. Continuous pulse ox and cardiac monitoring. Labs ordered/sent. Imaging ordered. Dispo decision including  potential need for admission considered, re diff dx appendicitis, diverticulitis, uti - will check labs and ct and reassess.  ? ?Reviewed nursing notes and prior charts for additional history. External reports reviewed. Additional history from: pcp office.  UA done earlier today - no uti.  ? ?Cardiac monitor: sinus rhythm, rate 88. ? ?Labs reviewed/interpreted by me - k mildly low. Kcl po. Wbc elevated.  ? ?CT reviewed/interpreted by me - diverticulitis.  ? ?Zosyn iv.  ? ?Po fluids. ? ?Recheck, tolerating po. Overall pt is well, not toxic appearing.  ? ?Pt currently appears stable for d/c. Vitals normal. ? ?Return precautions provided. ? ? ? ? ? ? ? ? ? ? ?Final Clinical Impression(s) / ED Diagnoses ?Final diagnoses:  ?None  ? ? ?Rx / DC Orders ?ED Discharge Orders   ? ? None  ? ?  ? ? ?  ?Lajean Saver, MD ?09/25/21 2022 ? ?

## 2021-09-25 NOTE — Progress Notes (Signed)
?  ? ?Subjective:  ?Patient ID: Anthony Austin, male    DOB: 06-13-1961, 61 y.o.   MRN: [id number] ? ?Patient Care Team: ?Claretta Fraise, MD as PCP - General (Family Medicine)  ? ?Chief Complaint:  Dysuria ? ? ?HPI: ?Anthony Austin is a 61 y.o. male presenting on 09/25/2021 for Dysuria ? ? ?Pt presents today with complaints of lower abdominal and suprapubic pain. Onset Monday and continues to worsen. Slight dysuria. Now with fever and chills. No nausea, vomiting, diarrhea, or constipation. Does have general malaise and loss of appetite. He is s/p prostatectomy over 10 years ago.  ? ?Abdominal Pain ?This is a new problem. The current episode started in the past 7 days. The onset quality is sudden. The problem occurs constantly. The problem has been gradually worsening. The pain is located in the suprapubic region, RLQ, periumbilical region and LLQ. The pain is moderate. The quality of the pain is aching, a sensation of fullness and sharp. Associated symptoms include anorexia, dysuria and a fever. Pertinent negatives include no arthralgias, belching, constipation, diarrhea, flatus, frequency, headaches, hematochezia, hematuria, melena, myalgias, nausea, vomiting or weight loss. The pain is aggravated by certain positions, belching, palpation, urination and movement. The pain is relieved by Nothing.  ?Relevant past medical, surgical, family, and social history reviewed and updated as indicated.  ?Allergies and medications reviewed and updated. Data reviewed: Chart in Epic. ? ? ?Past Medical History:  ?Diagnosis Date  ? Hyperlipidemia   ? Hypertension   ? Prostate CA [facility]) 2005  ? ? ?Past Surgical History:  ?Procedure Laterality Date  ? PROSTATE SURGERY    ? PROSTATECTOMY  2005  ? PROSTATECTOMY    ? ? ?Social History  ? ?Socioeconomic History  ? Marital status: Divorced  ?  Spouse name: Not on file  ? Number of children: Not on file  ? Years of education: Not on file  ? Highest education level: Not on file  ?Occupational  History  ? Not on file  ?Tobacco Use  ? Smoking status: Never  ? Smokeless tobacco: Never  ?Substance and Sexual Activity  ? Alcohol use: No  ? Drug use: No  ? Sexual activity: Not on file  ?Other Topics Concern  ? Not on file  ?Social History Narrative  ? Not on file  ? ?Social Determinants of Health  ? ?Financial Resource Strain: Not on file  ?Food Insecurity: Not on file  ?Transportation Needs: Not on file  ?Physical Activity: Not on file  ?Stress: Not on file  ?Social Connections: Not on file  ?Intimate Partner Violence: Not on file  ? ? ?Outpatient Encounter Medications as of 09/25/2021  ?Medication Sig  ? atorvastatin (LIPITOR) 40 MG tablet Take 1 tablet (40 mg total) by mouth daily. For cholesterol  ? hydrochlorothiazide (HYDRODIURIL) 25 MG tablet Take 1 tablet (25 mg total) by mouth daily.  ? metFORMIN (GLUCOPHAGE-XR) 500 MG 24 hr tablet Take 1 tablet (500 mg total) by mouth daily with breakfast.  ? lisinopril (ZESTRIL) 40 MG tablet Take 1 tablet (40 mg total) by mouth daily.  ? ?No facility-administered encounter medications on file as of 09/25/2021.  ? ? ?No Known Allergies ? ?Review of Systems  ?Constitutional:  Positive for activity change, appetite change, chills, diaphoresis, fatigue and fever. Negative for unexpected weight change and weight loss.  ?HENT: Negative.    ?Eyes: Negative.   ?Respiratory:  Negative for cough, chest tightness and shortness of breath.   ?Cardiovascular:  Negative for chest pain,  palpitations and leg swelling.  ?Gastrointestinal:  Positive for abdominal pain and anorexia. Negative for abdominal distention, anal bleeding, blood in stool, constipation, diarrhea, flatus, hematochezia, melena, nausea, rectal pain and vomiting.  ?Endocrine: Negative.   ?Genitourinary:  Positive for dysuria. Negative for decreased urine volume, difficulty urinating, enuresis, flank pain, frequency, genital sores, hematuria, penile discharge, penile pain, penile swelling, scrotal swelling, testicular  pain and urgency.  ?Musculoskeletal:  Negative for arthralgias and myalgias.  ?Skin: Negative.   ?Allergic/Immunologic: Negative.   ?Neurological:  Negative for dizziness, tremors, seizures, syncope, facial asymmetry, speech difficulty, weakness, light-headedness, numbness and headaches.  ?Hematological: Negative.   ?Psychiatric/Behavioral:  Negative for confusion, hallucinations, sleep disturbance and suicidal ideas.   ?All other systems reviewed and are negative. ? ?   ? ?Objective:  ?BP 136/88   Pulse 92   Temp (!) 101.3 ?F (38.5 ?C)   Ht 5' 7.5" (1.715 m)   Wt 198 lb 3.2 oz (89.9 kg)   SpO2 94%   BMI 30.58 kg/m?   ? ?Wt Readings from Last 3 Encounters:  ?09/25/21 198 lb 3.2 oz (89.9 kg)  ?07/10/21 198 lb (89.8 kg)  ?12/25/20 192 lb (87.1 kg)  ? ? ?Physical Exam ?Vitals and nursing note reviewed.  ?Constitutional:   ?   General: He is not in acute distress. ?   Appearance: Normal appearance. He is well-developed and well-groomed. He is obese. He is ill-appearing. He is not toxic-appearing or diaphoretic.  ?HENT:  ?   Head: Normocephalic and atraumatic.  ?   Jaw: There is normal jaw occlusion.  ?   Right Ear: Hearing normal.  ?   Left Ear: Hearing normal.  ?   Nose: Nose normal.  ?   Mouth/Throat:  ?   Lips: Pink.  ?   Mouth: Mucous membranes are moist.  ?   Pharynx: Oropharynx is clear. Uvula midline.  ?Eyes:  ?   General: Lids are normal.  ?   Extraocular Movements: Extraocular movements intact.  ?   Conjunctiva/sclera: Conjunctivae normal.  ?   Pupils: Pupils are equal, round, and reactive to light.  ?Neck:  ?   Thyroid: No thyroid mass, thyromegaly or thyroid tenderness.  ?   Vascular: No carotid bruit or JVD.  ?   Trachea: Trachea and phonation normal.  ?Cardiovascular:  ?   Rate and Rhythm: Normal rate and regular rhythm.  ?   Chest Resnick: PMI is not displaced.  ?   Pulses: Normal pulses.  ?   Heart sounds: Normal heart sounds. No murmur heard. ?  No friction rub. No gallop.  ?Pulmonary:  ?   Effort:  Pulmonary effort is normal. No respiratory distress.  ?   Breath sounds: Normal breath sounds. No wheezing.  ?Abdominal:  ?   General: Bowel sounds are normal. There is no distension or abdominal bruit.  ?   Palpations: Abdomen is soft. There is no hepatomegaly or splenomegaly.  ?   Tenderness: There is abdominal tenderness in the right lower quadrant, periumbilical area, suprapubic area and left lower quadrant. There is guarding and rebound. There is no right CVA tenderness or left CVA tenderness. Positive signs include psoas sign and obturator sign.  ?   Hernia: No hernia is present.  ?Musculoskeletal:     ?   General: Normal range of motion.  ?   Cervical back: Normal range of motion and neck supple.  ?   Right lower leg: No edema.  ?   Left lower leg:  No edema.  ?Lymphadenopathy:  ?   Cervical: No cervical adenopathy.  ?Skin: ?   General: Skin is warm and dry.  ?   Capillary Refill: Capillary refill takes less than 2 seconds.  ?   Coloration: Skin is pale. Skin is not cyanotic or jaundiced.  ?   Findings: No rash.  ?Neurological:  ?   General: No focal deficit present.  ?   Mental Status: He is alert and oriented to person, place, and time.  ?   Sensory: Sensation is intact.  ?   Motor: Motor function is intact.  ?   Coordination: Coordination is intact.  ?   Gait: Gait is intact.  ?   Deep Tendon Reflexes: Reflexes are normal and symmetric.  ?Psychiatric:     ?   Attention and Perception: Attention and perception normal.     ?   Mood and Affect: Mood and affect normal.     ?   Speech: Speech normal.     ?   Behavior: Behavior normal. Behavior is cooperative.     ?   Thought Content: Thought content normal.     ?   Cognition and Memory: Cognition and memory normal.     ?   Judgment: Judgment normal.  ? ? ?Results for orders placed or performed in visit on 07/10/21  ?CBC with Differential/Platelet  ?Result Value Ref Range  ? WBC 7.2 3.4 - 10.8 x10E3/uL  ? RBC 4.91 4.14 - 5.80 x10E6/uL  ? Hemoglobin 16.2 13.0 -  17.7 g/dL  ? Hematocrit 46.4 37.5 - 51.0 %  ? MCV 95 79 - 97 fL  ? MCH 33.0 26.6 - 33.0 pg  ? MCHC 34.9 31.5 - 35.7 g/dL  ? RDW 12.4 11.6 - 15.4 %  ? Platelets 241 150 - 450 x10E3/uL  ? Neutrophils 53 Not Estab. %  ?

## 2021-09-26 ENCOUNTER — Ambulatory Visit (HOSPITAL_COMMUNITY): Admission: RE | Admit: 2021-09-26 | Payer: BC Managed Care – PPO | Source: Ambulatory Visit

## 2021-09-27 LAB — URINE CULTURE

## 2021-10-05 ENCOUNTER — Other Ambulatory Visit: Payer: Self-pay | Admitting: Family Medicine

## 2021-10-09 ENCOUNTER — Ambulatory Visit: Payer: BC Managed Care – PPO | Admitting: Family Medicine

## 2021-10-15 ENCOUNTER — Encounter: Payer: Self-pay | Admitting: Family Medicine

## 2021-10-15 ENCOUNTER — Ambulatory Visit (INDEPENDENT_AMBULATORY_CARE_PROVIDER_SITE_OTHER): Payer: BC Managed Care – PPO | Admitting: Family Medicine

## 2021-10-15 VITALS — BP 119/78 | HR 60 | Temp 97.7°F | Ht 67.0 in | Wt 190.2 lb

## 2021-10-15 DIAGNOSIS — R7303 Prediabetes: Secondary | ICD-10-CM | POA: Diagnosis not present

## 2021-10-15 DIAGNOSIS — I10 Essential (primary) hypertension: Secondary | ICD-10-CM

## 2021-10-15 DIAGNOSIS — E785 Hyperlipidemia, unspecified: Secondary | ICD-10-CM

## 2021-10-15 LAB — BAYER DCA HB A1C WAIVED: HB A1C (BAYER DCA - WAIVED): 6 % — ABNORMAL HIGH (ref 4.8–5.6)

## 2021-10-15 NOTE — Progress Notes (Signed)
? ?Subjective:  ?Patient ID: Anthony Austin,  ?male    DOB: Apr 15, 1961  Age: 61 y.o.  ? ? ?CC: Medical Management of Chronic Issues ? ? ?HPI ?Tel L Santone presents for  follow-up of hypertension. Patient has no history of headache chest pain or shortness of breath or recent cough. Patient also denies symptoms of TIA such as numbness weakness lateralizing. Patient denies side effects from medication. States taking it regularly. ? ?Patient also  in for follow-up of elevated cholesterol. Doing well without complaints on current medication. Denies side effects  including myalgia and arthralgia and nausea. Also in today for liver function testing. Currently no chest pain, shortness of breath or other cardiovascular related symptoms noted. ? ?Follow-up of prediabetes. Patient does not check blood sugar at home. Patient denies symptoms such as excessive hunger or urinary frequency, excessive hunger, nausea ?No significant hypoglycemic spells noted. ?Medications reviewed. Pt reports taking them regularly. Pt. denies complication/adverse reaction today.  ? ? ?History ?Rameses has a past medical history of Hyperlipidemia, Hypertension, and Prostate CA ([place]) (2005).  ? ?He has a past surgical history that includes Prostate surgery; Prostatectomy (2005); and Prostatectomy.  ? ?His family history includes Cancer in his father; Diabetes in his mother; Hypertension in his father and mother; Stroke in his mother.He reports that he has never smoked. He has never used smokeless tobacco. He reports that he does not drink alcohol and does not use drugs. ? ?Current Outpatient Medications on File Prior to Visit  ?Medication Sig Dispense Refill  ? metFORMIN (GLUCOPHAGE-XR) 500 MG 24 hr tablet TAKE 1 TABLET BY MOUTH EVERY DAY WITH BREAKFAST 30 tablet 2  ? potassium chloride SA (KLOR-CON M) 20 MEQ tablet Take 1 tablet (20 mEq total) by mouth daily. (Patient not taking: Reported on 10/15/2021) 15 tablet 0  ? ?No current facility-administered  medications on file prior to visit.  ? ? ?ROS ?Review of Systems  ?Constitutional:  Negative for fever.  ?Respiratory:  Negative for shortness of breath.   ?Cardiovascular:  Negative for chest pain.  ?Endocrine: Negative for polydipsia, polyphagia and polyuria.  ?Musculoskeletal:  Negative for arthralgias.  ?Skin:  Negative for rash.  ? ?Objective:  ?BP 119/78   Pulse 60   Temp 97.7 ?F (36.5 ?C)   Ht 5' 7"  (1.702 m)   Wt 190 lb 3.2 oz (86.3 kg)   SpO2 97%   BMI 29.79 kg/m?  ? ?BP Readings from Last 3 Encounters:  ?10/15/21 119/78  ?09/25/21 136/76  ?09/25/21 136/88  ? ? ?Wt Readings from Last 3 Encounters:  ?10/15/21 190 lb 3.2 oz (86.3 kg)  ?09/25/21 195 lb (88.5 kg)  ?09/25/21 198 lb 3.2 oz (89.9 kg)  ? ? ? ?Physical Exam ?Vitals reviewed.  ?Constitutional:   ?   Appearance: He is well-developed.  ?HENT:  ?   Head: Normocephalic and atraumatic.  ?   Right Ear: External ear normal.  ?   Left Ear: External ear normal.  ?   Mouth/Throat:  ?   Pharynx: No oropharyngeal exudate or posterior oropharyngeal erythema.  ?Eyes:  ?   Pupils: Pupils are equal, round, and reactive to light.  ?Cardiovascular:  ?   Rate and Rhythm: Normal rate and regular rhythm.  ?   Heart sounds: No murmur heard. ?Pulmonary:  ?   Effort: No respiratory distress.  ?   Breath sounds: Normal breath sounds.  ?Musculoskeletal:  ?   Cervical back: Normal range of motion and neck supple.  ?Neurological:  ?  Mental Status: He is alert and oriented to person, place, and time.  ? ? ?Diabetic Foot Exam - Simple   ?No data filed ?  ? ? ? ? ?Assessment & Plan:  ? ?Dominion was seen today for medical management of chronic issues. ? ?Diagnoses and all orders for this visit: ? ?Essential hypertension ?-     CBC with Differential/Platelet ?-     CMP14+EGFR ?-     hydrochlorothiazide (HYDRODIURIL) 25 MG tablet; Take 1 tablet (25 mg total) by mouth daily. ?-     lisinopril (ZESTRIL) 40 MG tablet; Take 1 tablet (40 mg total) by mouth daily. ? ?Prediabetes ?-      Bayer DCA Hb A1c Waived ? ?Hyperlipidemia, unspecified hyperlipidemia type ?-     Lipid panel ? ?Other orders ?-     atorvastatin (LIPITOR) 40 MG tablet; Take 1 tablet (40 mg total) by mouth daily. For cholesterol ? ? ?I have discontinued Osha L. Venters's amoxicillin-clavulanate. I am also having him maintain his potassium chloride SA, metFORMIN, atorvastatin, hydrochlorothiazide, and lisinopril. ? ?Meds ordered this encounter  ?Medications  ? atorvastatin (LIPITOR) 40 MG tablet  ?  Sig: Take 1 tablet (40 mg total) by mouth daily. For cholesterol  ?  Dispense:  90 tablet  ?  Refill:  2  ? hydrochlorothiazide (HYDRODIURIL) 25 MG tablet  ?  Sig: Take 1 tablet (25 mg total) by mouth daily.  ?  Dispense:  90 tablet  ?  Refill:  2  ? lisinopril (ZESTRIL) 40 MG tablet  ?  Sig: Take 1 tablet (40 mg total) by mouth daily.  ?  Dispense:  90 tablet  ?  Refill:  3  ? ? ? ?Follow-up: Return in about 6 months (around 04/16/2022). ? ?Claretta Fraise, M.D. ?

## 2021-10-16 ENCOUNTER — Encounter: Payer: Self-pay | Admitting: Family Medicine

## 2021-10-16 LAB — CMP14+EGFR
ALT: 61 IU/L — ABNORMAL HIGH (ref 0–44)
AST: 36 IU/L (ref 0–40)
Albumin/Globulin Ratio: 2.5 — ABNORMAL HIGH (ref 1.2–2.2)
Albumin: 4.8 g/dL (ref 3.8–4.8)
Alkaline Phosphatase: 78 IU/L (ref 44–121)
BUN/Creatinine Ratio: 12 (ref 10–24)
BUN: 14 mg/dL (ref 8–27)
Bilirubin Total: 1 mg/dL (ref 0.0–1.2)
CO2: 26 mmol/L (ref 20–29)
Calcium: 10 mg/dL (ref 8.6–10.2)
Chloride: 100 mmol/L (ref 96–106)
Creatinine, Ser: 1.18 mg/dL (ref 0.76–1.27)
Globulin, Total: 1.9 g/dL (ref 1.5–4.5)
Glucose: 136 mg/dL — ABNORMAL HIGH (ref 70–99)
Potassium: 4.7 mmol/L (ref 3.5–5.2)
Sodium: 142 mmol/L (ref 134–144)
Total Protein: 6.7 g/dL (ref 6.0–8.5)
eGFR: 70 mL/min/{1.73_m2} (ref >59)

## 2021-10-16 LAB — CBC WITH DIFFERENTIAL/PLATELET
Basophils Absolute: 0 10*3/uL (ref 0.0–0.2)
Basos: 1 %
EOS (ABSOLUTE): 0.2 10*3/uL (ref 0.0–0.4)
Eos: 3 %
Hematocrit: 47.8 % (ref 37.5–51.0)
Hemoglobin: 16.3 g/dL (ref 13.0–17.7)
Immature Grans (Abs): 0 10*3/uL (ref 0.0–0.1)
Immature Granulocytes: 0 %
Lymphocytes Absolute: 2.1 10*3/uL (ref 0.7–3.1)
Lymphs: 35 %
MCH: 32.5 pg (ref 26.6–33.0)
MCHC: 34.1 g/dL (ref 31.5–35.7)
MCV: 95 fL (ref 79–97)
Monocytes Absolute: 0.4 10*3/uL (ref 0.1–0.9)
Monocytes: 7 %
Neutrophils Absolute: 3.3 10*3/uL (ref 1.4–7.0)
Neutrophils: 54 %
Platelets: 273 10*3/uL (ref 150–450)
RBC: 5.02 x10E6/uL (ref 4.14–5.80)
RDW: 12.3 % (ref 11.6–15.4)
WBC: 6 10*3/uL (ref 3.4–10.8)

## 2021-10-16 LAB — LIPID PANEL
Chol/HDL Ratio: 2.7 ratio (ref 0.0–5.0)
Cholesterol, Total: 93 mg/dL — ABNORMAL LOW (ref 100–199)
HDL: 35 mg/dL — ABNORMAL LOW (ref >39)
LDL Chol Calc (NIH): 42 mg/dL (ref 0–99)
Triglycerides: 76 mg/dL (ref 0–149)
VLDL Cholesterol Cal: 16 mg/dL (ref 5–40)

## 2021-10-16 MED ORDER — HYDROCHLOROTHIAZIDE 25 MG PO TABS: 25.0000 mg | ORAL_TABLET | Freq: Every day | ORAL | 2 refills | Status: DC

## 2021-10-16 MED ORDER — ATORVASTATIN CALCIUM 40 MG PO TABS: 40.0000 mg | ORAL_TABLET | Freq: Every day | ORAL | 2 refills | Status: DC

## 2021-10-16 MED ORDER — LISINOPRIL 40 MG PO TABS: 40.0000 mg | ORAL_TABLET | Freq: Every day | ORAL | 3 refills | Status: DC

## 2021-10-16 NOTE — Progress Notes (Signed)
Hello Anthony Austin,  Your lab result is normal and/or stable.Some minor variations that are not significant are commonly marked abnormal, but do not represent any medical problem for you.  Best regards, Ludwika Rodd, M.D.

## 2021-12-05 ENCOUNTER — Encounter: Payer: Self-pay | Admitting: Family Medicine

## 2021-12-05 ENCOUNTER — Ambulatory Visit (INDEPENDENT_AMBULATORY_CARE_PROVIDER_SITE_OTHER): Payer: BC Managed Care – PPO | Admitting: Family Medicine

## 2021-12-05 DIAGNOSIS — R42 Dizziness and giddiness: Secondary | ICD-10-CM

## 2021-12-05 MED ORDER — MECLIZINE HCL 25 MG PO TABS: 25.0000 mg | ORAL_TABLET | Freq: Three times a day (TID) | ORAL | 0 refills | Status: DC | PRN

## 2021-12-05 NOTE — Progress Notes (Signed)
Virtual Visit via telephone Note Due to COVID-19 pandemic this visit was conducted virtually. This visit type was conducted due to national recommendations for restrictions regarding the COVID-19 Pandemic (e.g. social distancing, sheltering in place) in an effort to limit this patient's exposure and mitigate transmission in our community. All issues noted in this document were discussed and addressed.  A physical exam was not performed with this format.   I connected with Anthony Austin on 12/05/2021 at 1310 by telephone and verified that I am speaking with the correct person using two identifiers. Kyllian L Dornfeld is currently located at home and patient is currently with them during visit. The provider, Monia Pouch, FNP is located in their office at time of visit.  I discussed the limitations, risks, security and privacy concerns of performing an evaluation and management service by virtual visit and the availability of in person appointments. I also discussed with the patient that there may be a patient responsible charge related to this service. The patient expressed understanding and agreed to proceed.  Subjective:  Patient ID: Anthony Austin, male    DOB: February 18, 1961, 61 y.o.   MRN: [id number]  Chief Complaint:  Dizziness   HPI: Anthony Austin is a 61 y.o. male presenting on 12/05/2021 for Dizziness   Pt reports intermittent feelings of dizziness, swimmy headedness, and being off balance. States this started yesterday. States worse when walking or making sudden movements. Has tried increasing water without improvement of symptoms. No weakness, confusion, slurred speech, syncope, visual changes, or nausea.  Dizziness This is a new problem. The current episode started yesterday. The problem occurs intermittently. The problem has been waxing and waning. Associated symptoms include vertigo. Pertinent negatives include no abdominal pain, anorexia, arthralgias, change in bowel habit, chest pain, chills,  congestion, coughing, diaphoresis, fatigue, fever, headaches, joint swelling, myalgias, nausea, neck pain, numbness, rash, sore throat, swollen glands, urinary symptoms, visual change, vomiting or weakness. The symptoms are aggravated by twisting, walking and standing. He has tried drinking for the symptoms. The treatment provided no relief.     Relevant past medical, surgical, family, and social history reviewed and updated as indicated.  Allergies and medications reviewed and updated.   Past Medical History:  Diagnosis Date   Hyperlipidemia    Hypertension    Prostate CA ([place]) 2005    Past Surgical History:  Procedure Laterality Date   PROSTATE SURGERY     PROSTATECTOMY  2005   PROSTATECTOMY      Social History   Socioeconomic History   Marital status: Divorced    Spouse name: Not on file   Number of children: Not on file   Years of education: Not on file   Highest education level: Not on file  Occupational History   Not on file  Tobacco Use   Smoking status: Never   Smokeless tobacco: Never  Substance and Sexual Activity   Alcohol use: No   Drug use: No   Sexual activity: Not on file  Other Topics Concern   Not on file  Social History Narrative   Not on file   Social Determinants of Health   Financial Resource Strain: Not on file  Food Insecurity: Not on file  Transportation Needs: Not on file  Physical Activity: Not on file  Stress: Not on file  Social Connections: Not on file  Intimate Partner Violence: Not on file    Outpatient Encounter Medications as of 12/05/2021  Medication Sig   meclizine (ANTIVERT) 25  MG tablet Take 1 tablet (25 mg total) by mouth 3 (three) times daily as needed for dizziness.   atorvastatin (LIPITOR) 40 MG tablet Take 1 tablet (40 mg total) by mouth daily. For cholesterol   hydrochlorothiazide (HYDRODIURIL) 25 MG tablet Take 1 tablet (25 mg total) by mouth daily.   lisinopril (ZESTRIL) 40 MG tablet Take 1 tablet (40 mg total) by  mouth daily.   metFORMIN (GLUCOPHAGE-XR) 500 MG 24 hr tablet TAKE 1 TABLET BY MOUTH EVERY DAY WITH BREAKFAST   potassium chloride SA (KLOR-CON M) 20 MEQ tablet Take 1 tablet (20 mEq total) by mouth daily. (Patient not taking: Reported on 10/15/2021)   No facility-administered encounter medications on file as of 12/05/2021.    No Known Allergies  Review of Systems  Constitutional:  Negative for activity change, appetite change, chills, diaphoresis, fatigue, fever and unexpected weight change.  HENT:  Negative for congestion and sore throat.   Eyes:  Negative for photophobia and visual disturbance.  Respiratory:  Negative for cough.   Cardiovascular:  Negative for chest pain.  Gastrointestinal:  Negative for abdominal pain, anorexia, change in bowel habit, nausea and vomiting.  Genitourinary:  Negative for decreased urine volume and difficulty urinating.  Musculoskeletal:  Negative for arthralgias, joint swelling, myalgias and neck pain.  Skin:  Negative for rash.  Neurological:  Positive for dizziness, vertigo and light-headedness. Negative for tremors, seizures, syncope, facial asymmetry, speech difficulty, weakness, numbness and headaches.  Psychiatric/Behavioral:  Negative for agitation, behavioral problems, confusion, decreased concentration, dysphoric mood, hallucinations, self-injury, sleep disturbance and suicidal ideas. The patient is not nervous/anxious and is not hyperactive.   All other systems reviewed and are negative.        Observations/Objective: No vital signs or physical exam, this was a virtual health encounter.  Pt alert and oriented, answers all questions appropriately, and able to speak in full sentences.    Assessment and Plan: Nicoli was seen today for dizziness.  Diagnoses and all orders for this visit:  Vertigo Reported symptoms consistent with vertigo. No reported symptoms concerning for acute neurovascular event. Will trail meclizine to see if beneficial.  Red flags discussed with pt. Aware to report any new, worsening, or persistent symptoms.  -     meclizine (ANTIVERT) 25 MG tablet; Take 1 tablet (25 mg total) by mouth 3 (three) times daily as needed for dizziness.     Follow Up Instructions: Return if symptoms worsen or fail to improve.    I discussed the assessment and treatment plan with the patient. The patient was provided an opportunity to ask questions and all were answered. The patient agreed with the plan and demonstrated an understanding of the instructions.   The patient was advised to call back or seek an in-person evaluation if the symptoms worsen or if the condition fails to improve as anticipated.  The above assessment and management plan was discussed with the patient. The patient verbalized understanding of and has agreed to the management plan. Patient is aware to call the clinic if they develop any new symptoms or if symptoms persist or worsen. Patient is aware when to return to the clinic for a follow-up visit. Patient educated on when it is appropriate to go to the emergency department.    I provided 13 minutes of time during this telephone encounter.   Monia Pouch, FNP-C [facility] [address] [place], [place] [address] [phone] 12/05/2021

## 2022-01-02 ENCOUNTER — Other Ambulatory Visit: Payer: Self-pay | Admitting: Family Medicine

## 2022-01-15 ENCOUNTER — Encounter: Payer: BC Managed Care – PPO | Admitting: Family Medicine

## 2022-01-22 ENCOUNTER — Encounter: Payer: Self-pay | Admitting: Family Medicine

## 2022-01-22 ENCOUNTER — Ambulatory Visit (INDEPENDENT_AMBULATORY_CARE_PROVIDER_SITE_OTHER): Payer: BC Managed Care – PPO | Admitting: Family Medicine

## 2022-01-22 VITALS — BP 142/91 | HR 70 | Temp 97.8°F | Ht 67.0 in | Wt 195.2 lb

## 2022-01-22 DIAGNOSIS — R310 Gross hematuria: Secondary | ICD-10-CM

## 2022-01-22 DIAGNOSIS — N3001 Acute cystitis with hematuria: Secondary | ICD-10-CM

## 2022-01-22 DIAGNOSIS — R319 Hematuria, unspecified: Secondary | ICD-10-CM | POA: Diagnosis not present

## 2022-01-22 LAB — URINALYSIS, ROUTINE W REFLEX MICROSCOPIC
Bilirubin, UA: NEGATIVE
Ketones, UA: NEGATIVE
Nitrite, UA: NEGATIVE
Specific Gravity, UA: 1.015 (ref 1.005–1.030)
Urobilinogen, Ur: 0.2 mg/dL (ref 0.2–1.0)
pH, UA: 5.5 (ref 5.0–7.5)

## 2022-01-22 LAB — MICROSCOPIC EXAMINATION
Epithelial Cells (non renal): NONE SEEN /hpf (ref 0–10)
RBC, Urine: >30 /hpf — AB (ref 0–2)
Renal Epithel, UA: NONE SEEN /hpf

## 2022-01-22 MED ORDER — SULFAMETHOXAZOLE-TRIMETHOPRIM 800-160 MG PO TABS: 1.0000 | ORAL_TABLET | Freq: Two times a day (BID) | ORAL | 0 refills | Status: DC

## 2022-01-22 NOTE — Progress Notes (Signed)
Assessment & Plan:  1. Acute cystitis with hematuria Education provided on UTIs. Encouraged adequate hydration.  - Urine Culture - sulfamethoxazole-trimethoprim (BACTRIM DS) 800-160 MG tablet; Take 1 tablet by mouth 2 (two) times daily for 7 days.  Dispense: 14 tablet; Refill: 0  2. Gross hematuria - Urinalysis, Routine w reflex microscopic - Urine dipstick shows positive for RBC's, positive for protein, positive for glucose, and positive for leukocytes.  Micro exam: 11-30 WBC's per HPF, >30 RBC's per HPF, and few bacteria.   Follow up plan: Return if symptoms worsen or fail to improve.  Hendricks Limes, MSN, APRN, FNP-C [facility]  Subjective:   Patient ID: Anthony Austin, male    DOB: 1960-08-01, 61 y.o.   MRN: [id number]  HPI: Anthony Austin is a 61 y.o. male presenting on 01/22/2022 for Hematuria  Patient complains of hematuria and urgency. He has had symptoms for a few days. Hematuria started this morning. Patient also complains of back pain. Patient denies fever. Patient does not have a history of recurrent UTI.  Patient does not have a history of pyelonephritis.    ROS: Negative unless specifically indicated above in HPI.   Relevant past medical history reviewed and updated as indicated.   Allergies and medications reviewed and updated.   Current Outpatient Medications:    atorvastatin (LIPITOR) 40 MG tablet, Take 1 tablet (40 mg total) by mouth daily. For cholesterol, Disp: 90 tablet, Rfl: 2   hydrochlorothiazide (HYDRODIURIL) 25 MG tablet, Take 1 tablet (25 mg total) by mouth daily., Disp: 90 tablet, Rfl: 2   lisinopril (ZESTRIL) 40 MG tablet, Take 1 tablet (40 mg total) by mouth daily., Disp: 90 tablet, Rfl: 3   meclizine (ANTIVERT) 25 MG tablet, Take 1 tablet (25 mg total) by mouth 3 (three) times daily as needed for dizziness., Disp: 30 tablet, Rfl: 0   metFORMIN (GLUCOPHAGE-XR) 500 MG 24 hr tablet, Take 1 tablet (500 mg total) by mouth daily with  breakfast. (NEEDS TO BE SEEN BEFORE NEXT REFILL), Disp: 30 tablet, Rfl: 0   potassium chloride SA (KLOR-CON M) 20 MEQ tablet, Take 1 tablet (20 mEq total) by mouth daily. (Patient not taking: Reported on 10/15/2021), Disp: 15 tablet, Rfl: 0  No Known Allergies  Objective:   BP (!) 142/91   Pulse 70   Temp 97.8 F (36.6 C) (Temporal)   Ht '5\' 7"'$  (1.702 m)   Wt 195 lb 3.2 oz (88.5 kg)   SpO2 98%   BMI 30.57 kg/m    Physical Exam Vitals reviewed.  Constitutional:      General: He is not in acute distress.    Appearance: Normal appearance. He is not ill-appearing, toxic-appearing or diaphoretic.  HENT:     Head: Normocephalic and atraumatic.  Eyes:     General: No scleral icterus.       Right eye: No discharge.        Left eye: No discharge.     Conjunctiva/sclera: Conjunctivae normal.  Cardiovascular:     Rate and Rhythm: Normal rate.  Pulmonary:     Effort: Pulmonary effort is normal. No respiratory distress.  Abdominal:     Tenderness: There is no right CVA tenderness or left CVA tenderness.  Musculoskeletal:        General: Normal range of motion.     Cervical back: Normal range of motion.  Skin:    General: Skin is warm and dry.  Neurological:     Mental Status: He  is alert and oriented to person, place, and time. Mental status is at baseline.  Psychiatric:        Mood and Affect: Mood normal.        Behavior: Behavior normal.        Thought Content: Thought content normal.        Judgment: Judgment normal.

## 2022-01-25 LAB — URINE CULTURE

## 2022-01-27 ENCOUNTER — Other Ambulatory Visit: Payer: Self-pay | Admitting: Family Medicine

## 2022-01-27 DIAGNOSIS — N3001 Acute cystitis with hematuria: Secondary | ICD-10-CM

## 2022-01-27 MED ORDER — CEFDINIR 300 MG PO CAPS: 300.0000 mg | ORAL_CAPSULE | Freq: Two times a day (BID) | ORAL | 0 refills | Status: AC

## 2022-02-01 ENCOUNTER — Other Ambulatory Visit: Payer: Self-pay | Admitting: Family Medicine

## 2022-02-09 ENCOUNTER — Encounter: Payer: Self-pay | Admitting: Family Medicine

## 2022-02-09 ENCOUNTER — Ambulatory Visit (INDEPENDENT_AMBULATORY_CARE_PROVIDER_SITE_OTHER): Payer: BC Managed Care – PPO | Admitting: Family Medicine

## 2022-02-09 VITALS — BP 125/74 | HR 80 | Temp 98.3°F | Ht 67.0 in | Wt 194.5 lb

## 2022-02-09 DIAGNOSIS — H02051 Trichiasis without entropian right upper eyelid: Secondary | ICD-10-CM

## 2022-02-09 DIAGNOSIS — H02059 Trichiasis without entropian unspecified eye, unspecified eyelid: Secondary | ICD-10-CM

## 2022-02-09 NOTE — Progress Notes (Signed)
   Acute Office Visit  Subjective:     Patient ID: Anthony Austin, male    DOB: 06-11-1961, 61 y.o.   MRN: [id number]  Chief Complaint  Patient presents with   Eye Problem    Eye Problem    Patient is in today for an ingrown eye lash of hir right upper eye lid. He noticed it over the weekend when he was trimming his eyebrows. He denies pain, changes in vision, discharge, swelling, erythema, tenderness, or exudate. His coworker attempted to remove it with tweezers without success.   ROS As per HPI.      Objective:    BP 125/74   Pulse 80   Temp 98.3 F (36.8 C) (Temporal)   Ht '5\' 7"'$  (1.702 m)   Wt 194 lb 8 oz (88.2 kg)   SpO2 96%   BMI 30.46 kg/m    Physical Exam Vitals and nursing note reviewed.  Constitutional:      General: He is not in acute distress.    Appearance: He is not ill-appearing, toxic-appearing or diaphoretic.  Eyes:     General:        Right eye: No discharge or hordeolum.        Left eye: No discharge or hordeolum.     Extraocular Movements: Extraocular movements intact.     Conjunctiva/sclera: Conjunctivae normal.     Comments: Ingrown eyelash of right upper eye lid. No exudate, tenderness, or erythema. No swelling.   Pulmonary:     Effort: Pulmonary effort is normal. No respiratory distress.  Neurological:     Mental Status: He is alert and oriented to person, place, and time.     Gait: Gait normal.  Psychiatric:        Mood and Affect: Mood normal.        Behavior: Behavior normal.   Foreign Body Removal  Date/Time: 02/09/2022 4:25 PM  Performed by: Gwenlyn Perking, FNP Authorized by: Gwenlyn Perking, FNP  Body area: eye Location details: right eyelid  Sedation: Patient sedated: no  Patient restrained: no Patient cooperative: yes Removal mechanism: forceps. Eye not examined with fluorescein. Depth: superficial Complexity: simple 1 objects recovered. Objects recovered: eye lash Post-procedure assessment: foreign body  removed Patient tolerance: patient tolerated the procedure well with no immediate complications   No results found for any visits on 02/09/22.      Assessment & Plan:   [name] was seen today for eye problem.  Diagnoses and all orders for this visit:  Ingrowing eyelash of upper lid Removed today. No signs of infection today. Return to office for new or worsening symptoms, or if symptoms persist.  -     Foreign Body Removal  Gwenlyn Perking, FNP

## 2022-03-16 ENCOUNTER — Emergency Department (HOSPITAL_COMMUNITY): Payer: BC Managed Care – PPO

## 2022-03-16 ENCOUNTER — Other Ambulatory Visit: Payer: Self-pay

## 2022-03-16 ENCOUNTER — Encounter (HOSPITAL_COMMUNITY): Payer: Self-pay | Admitting: Emergency Medicine

## 2022-03-16 DIAGNOSIS — M25512 Pain in left shoulder: Secondary | ICD-10-CM | POA: Insufficient documentation

## 2022-03-16 DIAGNOSIS — Z8546 Personal history of malignant neoplasm of prostate: Secondary | ICD-10-CM | POA: Diagnosis not present

## 2022-03-16 DIAGNOSIS — J929 Pleural plaque without asbestos: Secondary | ICD-10-CM | POA: Diagnosis not present

## 2022-03-16 DIAGNOSIS — R079 Chest pain, unspecified: Secondary | ICD-10-CM | POA: Diagnosis not present

## 2022-03-16 DIAGNOSIS — I10 Essential (primary) hypertension: Secondary | ICD-10-CM | POA: Insufficient documentation

## 2022-03-16 DIAGNOSIS — M79622 Pain in left upper arm: Secondary | ICD-10-CM | POA: Diagnosis not present

## 2022-03-16 DIAGNOSIS — M549 Dorsalgia, unspecified: Secondary | ICD-10-CM | POA: Diagnosis not present

## 2022-03-16 DIAGNOSIS — K429 Umbilical hernia without obstruction or gangrene: Secondary | ICD-10-CM | POA: Diagnosis not present

## 2022-03-16 DIAGNOSIS — K5730 Diverticulosis of large intestine without perforation or abscess without bleeding: Secondary | ICD-10-CM | POA: Diagnosis not present

## 2022-03-16 DIAGNOSIS — I2510 Atherosclerotic heart disease of native coronary artery without angina pectoris: Secondary | ICD-10-CM | POA: Diagnosis not present

## 2022-03-16 DIAGNOSIS — K449 Diaphragmatic hernia without obstruction or gangrene: Secondary | ICD-10-CM | POA: Diagnosis not present

## 2022-03-16 LAB — COMPREHENSIVE METABOLIC PANEL
ALT: 56 U/L — ABNORMAL HIGH (ref 0–44)
AST: 32 U/L (ref 15–41)
Albumin: 4.2 g/dL (ref 3.5–5.0)
Alkaline Phosphatase: 72 U/L (ref 38–126)
Anion gap: 9 (ref 5–15)
BUN: 14 mg/dL (ref 8–23)
CO2: 31 mmol/L (ref 22–32)
Calcium: 9.5 mg/dL (ref 8.9–10.3)
Chloride: 101 mmol/L (ref 98–111)
Creatinine, Ser: 1.12 mg/dL (ref 0.61–1.24)
GFR, Estimated: >60 mL/min (ref >60)
Glucose, Bld: 120 mg/dL — ABNORMAL HIGH (ref 70–99)
Potassium: 3.5 mmol/L (ref 3.5–5.1)
Sodium: 141 mmol/L (ref 135–145)
Total Bilirubin: 0.9 mg/dL (ref 0.3–1.2)
Total Protein: 7 g/dL (ref 6.5–8.1)

## 2022-03-16 LAB — CBC
HCT: 44.6 % (ref 39.0–52.0)
Hemoglobin: 15.5 g/dL (ref 13.0–17.0)
MCH: 33.6 pg (ref 26.0–34.0)
MCHC: 34.8 g/dL (ref 30.0–36.0)
MCV: 96.7 fL (ref 80.0–100.0)
Platelets: 229 10*3/uL (ref 150–400)
RBC: 4.61 MIL/uL (ref 4.22–5.81)
RDW: 12.6 % (ref 11.5–15.5)
WBC: 6.3 10*3/uL (ref 4.0–10.5)
nRBC: 0 % (ref 0.0–0.2)

## 2022-03-16 LAB — TROPONIN I (HIGH SENSITIVITY): Troponin I (High Sensitivity): 14 ng/L (ref <18)

## 2022-03-16 NOTE — ED Triage Notes (Signed)
Pt states he has had pain in his shoulder blades since Saturday night and that today he developed pain in his L shoulder that radiated down to his L bicep. Pt/Family concerned for Heart attack as brother "just had massive heart attack". Pt denies chest pain.

## 2022-03-17 ENCOUNTER — Emergency Department (HOSPITAL_COMMUNITY): Payer: BC Managed Care – PPO

## 2022-03-17 ENCOUNTER — Emergency Department (HOSPITAL_COMMUNITY)
Admission: EM | Admit: 2022-03-17 | Discharge: 2022-03-17 | Disposition: A | Discharge status: Home / Self Care | Payer: BC Managed Care – PPO | Attending: Emergency Medicine | Admitting: Emergency Medicine

## 2022-03-17 DIAGNOSIS — K429 Umbilical hernia without obstruction or gangrene: Secondary | ICD-10-CM | POA: Diagnosis not present

## 2022-03-17 DIAGNOSIS — K449 Diaphragmatic hernia without obstruction or gangrene: Secondary | ICD-10-CM | POA: Diagnosis not present

## 2022-03-17 DIAGNOSIS — K5730 Diverticulosis of large intestine without perforation or abscess without bleeding: Secondary | ICD-10-CM | POA: Diagnosis not present

## 2022-03-17 DIAGNOSIS — M549 Dorsalgia, unspecified: Secondary | ICD-10-CM | POA: Diagnosis not present

## 2022-03-17 DIAGNOSIS — J929 Pleural plaque without asbestos: Secondary | ICD-10-CM | POA: Diagnosis not present

## 2022-03-17 DIAGNOSIS — M25512 Pain in left shoulder: Secondary | ICD-10-CM

## 2022-03-17 DIAGNOSIS — I2510 Atherosclerotic heart disease of native coronary artery without angina pectoris: Secondary | ICD-10-CM | POA: Diagnosis not present

## 2022-03-17 LAB — TROPONIN I (HIGH SENSITIVITY): Troponin I (High Sensitivity): 13 ng/L (ref <18)

## 2022-03-17 MED ORDER — IOHEXOL 350 MG/ML SOLN
100.0000 mL | Freq: Once | INTRAVENOUS | Status: AC | PRN
  Administered 2022-03-17: 100 mL via INTRAVENOUS

## 2022-03-17 NOTE — ED Provider Notes (Signed)
[facility] Emergency Department Provider Note MRN:  [id number]  Arrival date & time: 03/17/22     Chief Complaint   Shoulder Pain   History of Present Illness   Anthony Austin is a 61 y.o. year-old male with a history of hypertension presenting to the ED with chief complaint of shoulder pain.  Sudden pain to the left shoulder this evening.  Located in the left deltoid, left upper arm.  Also notes some pain to the left shoulder blade.  Denies trauma, unsure if he overexerted himself at work.  No chest pain, no shortness of breath, no numbness or weakness to the arms or legs.  Review of Systems  A thorough review of systems was obtained and all systems are negative except as noted in the HPI and PMH.   Patient's Health History    Past Medical History:  Diagnosis Date   Hyperlipidemia    Hypertension    Prostate CA ([place]) 2005    Past Surgical History:  Procedure Laterality Date   PROSTATE SURGERY     PROSTATECTOMY  2005   PROSTATECTOMY      Family History  Problem Relation Age of Onset   Stroke Mother    Hypertension Mother    Diabetes Mother    Hypertension Father    Cancer Father     Social History   Socioeconomic History   Marital status: Divorced    Spouse name: Not on file   Number of children: Not on file   Years of education: Not on file   Highest education level: Not on file  Occupational History   Not on file  Tobacco Use   Smoking status: Never   Smokeless tobacco: Never  Substance and Sexual Activity   Alcohol use: No   Drug use: No   Sexual activity: Not on file  Other Topics Concern   Not on file  Social History Narrative   Not on file   Social Determinants of Health   Financial Resource Strain: Not on file  Food Insecurity: Not on file  Transportation Needs: Not on file  Physical Activity: Not on file  Stress: Not on file  Social Connections: Not on file  Intimate Partner Violence: Not on file     Physical  Exam   Vitals:   03/16/22 2211 03/17/22 0242  BP: (!) 183/99 (!) 162/103  Pulse: 66 63  Resp: 18 14  Temp: 98.2 F (36.8 C)   SpO2: 98% 99%    CONSTITUTIONAL: Well-appearing, NAD NEURO/PSYCH:  Alert and oriented x 3, no focal deficits EYES:  eyes equal and reactive ENT/NECK:  no LAD, no JVD CARDIO: Regular rate, well-perfused, normal S1 and S2 PULM:  CTAB no wheezing or rhonchi GI/GU:  non-distended, non-tender MSK/SPINE:  No gross deformities, no edema SKIN:  no rash, atraumatic   *Additional and/or pertinent findings included in MDM below  Diagnostic and Interventional Summary    EKG Interpretation  Date/Time:  Monday March 16 2022 22:16:09 EDT Ventricular Rate:  63 PR Interval:  178 QRS Duration: 76 QT Interval:  406 QTC Calculation: 415 R Axis:   22 Text Interpretation: Normal sinus rhythm Anterior infarct , age undetermined Abnormal ECG No significant change was found Confirmed by Gerlene Fee [id number]) on 03/16/2022 11:40:39 PM       Labs Reviewed  COMPREHENSIVE METABOLIC PANEL - Abnormal; Notable for the following components:      Result Value   Glucose, Bld 120 (*)    ALT  56 (*)    All other components within normal limits  CBC  TROPONIN I (HIGH SENSITIVITY)  TROPONIN I (HIGH SENSITIVITY)    CT Angio Chest/Abd/Pel for Dissection W and/or Wo Contrast  Final Result    DG Chest 2 View  Final Result      Medications  iohexol (OMNIPAQUE) 350 MG/ML injection 100 mL (100 mLs Intravenous Contrast Given 03/17/22 0332)     Procedures  /  Critical Care Procedures  ED Course and Medical Decision Making  Initial Impression and Ddx Differential diagnosis includes ACS, dissection, MSK.  Well-appearing, hypertensive but in no acute distress.  Past medical/surgical history that increases complexity of ED encounter: Hypertension  Interpretation of Diagnostics I personally reviewed the EKG and my interpretation is as follows: Sinus rhythm, no significant  change from prior  Labs reassuring with no significant blood count or electrolyte disturbance, troponin negative x2  Patient Reassessment and Ultimate Disposition/Management     CTAs without evidence of dissection.  There is some reproducibility of the shoulder blade pain when patient stretches the left arm across the body.  And so suspect MSK, appropriate for discharge.  Patient management required discussion with the following services or consulting groups:  None  Complexity of Problems Addressed Acute illness or injury that poses threat of life of bodily function  Additional Data Reviewed and Analyzed Further history obtained from: Further history from spouse/family member  Additional Factors Impacting ED Encounter Risk None  Barth Kirks. [place]onia Small, [place] [email or web address]  Final Clinical Impressions(s) / ED Diagnoses     ICD-10-CM   1. Acute pain of left shoulder  M25.512       ED Discharge Orders     None        Discharge Instructions Discussed with and Provided to Patient:    Discharge Instructions      You were evaluated in the Emergency Department and after careful evaluation, we did not find any emergent condition requiring admission or further testing in the hospital.  Your exam/testing today was overall reassuring.  Your CT scan did not show any emergencies or abnormalities.  Your repeat troponin test was negative.  Suspect muscular strain or spasm, recommend Tylenol or Motrin for discomfort, close follow-up with your primary care doctor.  Please return to the Emergency Department if you experience any worsening of your condition.  Thank you for allowing [place] to be a part of your care.       [name], MD 03/17/22 [phone]

## 2022-03-17 NOTE — Discharge Instructions (Signed)
You were evaluated in the Emergency Department and after careful evaluation, we did not find any emergent condition requiring admission or further testing in the hospital.  Your exam/testing today was overall reassuring.  Your CT scan did not show any emergencies or abnormalities.  Your repeat troponin test was negative.  Suspect muscular strain or spasm, recommend Tylenol or Motrin for discomfort, close follow-up with your primary care doctor.  Please return to the Emergency Department if you experience any worsening of your condition.  Thank you for allowing [place] to be a part of your care.

## 2022-03-26 ENCOUNTER — Encounter: Payer: Self-pay | Admitting: Family Medicine

## 2022-03-26 ENCOUNTER — Ambulatory Visit (INDEPENDENT_AMBULATORY_CARE_PROVIDER_SITE_OTHER): Payer: BC Managed Care – PPO

## 2022-03-26 ENCOUNTER — Ambulatory Visit (INDEPENDENT_AMBULATORY_CARE_PROVIDER_SITE_OTHER): Payer: BC Managed Care – PPO | Admitting: Family Medicine

## 2022-03-26 VITALS — BP 132/86 | HR 62 | Temp 97.7°F | Ht 67.0 in | Wt 195.8 lb

## 2022-03-26 DIAGNOSIS — M25512 Pain in left shoulder: Secondary | ICD-10-CM | POA: Diagnosis not present

## 2022-03-26 MED ORDER — BETAMETHASONE SOD PHOS & ACET 6 (3-3) MG/ML IJ SUSP
6.0000 mg | Freq: Once | INTRAMUSCULAR | Status: AC
  Administered 2022-03-26: 6 mg via INTRAMUSCULAR

## 2022-03-26 NOTE — Progress Notes (Signed)
Subjective:  Patient ID: Anthony Austin, male    DOB: 1961/06/09  Age: 61 y.o. MRN: [id number]  CC: ER FOLLOW UP (LEFT SHOULDER/ARM PAIN)   HPI Anthony Austin presents for pain starting 2 weeks ago. Left shoulder, Upper arm at bicep. Checked for heart at the E.D. Work up negative, pain continues.  Shoulder still hurts. Constant from shoulder to wrist. Starts at the base of the neck. NKI. Occasional heavy lifting at work. Exacerbates the pain. Moderate intensity.       03/26/2022    9:49 AM 02/09/2022    3:51 PM 10/15/2021    8:38 AM  Depression screen PHQ 2/9  Decreased Interest 0 0 0  Down, Depressed, Hopeless 0 0 0  PHQ - 2 Score 0 0 0    History Anthony Austin has a past medical history of Hyperlipidemia, Hypertension, and Prostate CA ([place]) (2005).   He has a past surgical history that includes Prostate surgery; Prostatectomy (2005); and Prostatectomy.   His family history includes Cancer in his father; Diabetes in his mother; Hypertension in his father and mother; Stroke in his mother.He reports that he has never smoked. He has never used smokeless tobacco. He reports that he does not drink alcohol and does not use drugs.    ROS Review of Systems  Constitutional:  Negative for fever.  Respiratory:  Negative for shortness of breath.   Cardiovascular:  Negative for chest pain.  Musculoskeletal:  Positive for arthralgias.  Skin:  Negative for rash.    Objective:  BP 132/86   Pulse 62   Temp 97.7 F (36.5 C)   Ht '5\' 7"'$  (1.702 m)   Wt 195 lb 12.8 oz (88.8 kg)   SpO2 96%   BMI 30.67 kg/m   BP Readings from Last 3 Encounters:  03/26/22 132/86  03/17/22 (!) 152/79  02/09/22 125/74    Wt Readings from Last 3 Encounters:  03/26/22 195 lb 12.8 oz (88.8 kg)  03/16/22 195 lb (88.5 kg)  02/09/22 194 lb 8 oz (88.2 kg)     Physical Exam Vitals reviewed.  Constitutional:      Appearance: He is well-developed.  HENT:     Head: Normocephalic and atraumatic.     Right Ear:  External ear normal.     Left Ear: External ear normal.     Mouth/Throat:     Pharynx: No oropharyngeal exudate or posterior oropharyngeal erythema.  Eyes:     Pupils: Pupils are equal, round, and reactive to light.  Cardiovascular:     Rate and Rhythm: Normal rate and regular rhythm.     Heart sounds: No murmur heard. Pulmonary:     Effort: No respiratory distress.     Breath sounds: Normal breath sounds.  Musculoskeletal:        General: Tenderness (over left deltoid and bicep.) present.     Cervical back: Normal range of motion and neck supple.  Neurological:     Mental Status: He is alert and oriented to person, place, and time.     XR - no fracture noted  Assessment & Plan:   Price was seen today for er follow up.  Diagnoses and all orders for this visit:  Acute pain of left shoulder -     DG Shoulder Left; Future -     betamethasone acetate-betamethasone sodium phosphate (CELESTONE) injection 6 mg       I am having Anthony Austin maintain his potassium chloride SA, atorvastatin, hydrochlorothiazide, lisinopril, meclizine,  and metFORMIN. We administered betamethasone acetate-betamethasone sodium phosphate.  Allergies as of 03/26/2022   No Known Allergies      Medication List        Accurate as of March 26, 2022 11:59 PM. If you have any questions, ask your nurse or doctor.          atorvastatin 40 MG tablet Commonly known as: LIPITOR Take 1 tablet (40 mg total) by mouth daily. For cholesterol   hydrochlorothiazide 25 MG tablet Commonly known as: HYDRODIURIL Take 1 tablet (25 mg total) by mouth daily.   lisinopril 40 MG tablet Commonly known as: ZESTRIL Take 1 tablet (40 mg total) by mouth daily.   meclizine 25 MG tablet Commonly known as: ANTIVERT Take 1 tablet (25 mg total) by mouth 3 (three) times daily as needed for dizziness.   metFORMIN 500 MG 24 hr tablet Commonly known as: GLUCOPHAGE-XR Take 1 tablet (500 mg total) by mouth daily with  breakfast. (NEEDS TO BE SEEN BEFORE NEXT REFILL)   potassium chloride SA 20 MEQ tablet Commonly known as: KLOR-CON M Take 1 tablet (20 mEq total) by mouth daily.         Follow-up: Return in about 2 weeks (around 04/09/2022), or if symptoms worsen or fail to improve.  Claretta Fraise, M.D.

## 2022-03-29 ENCOUNTER — Encounter: Payer: Self-pay | Admitting: Family Medicine

## 2022-04-15 ENCOUNTER — Ambulatory Visit (INDEPENDENT_AMBULATORY_CARE_PROVIDER_SITE_OTHER): Payer: BC Managed Care – PPO | Admitting: Family Medicine

## 2022-04-15 ENCOUNTER — Encounter: Payer: Self-pay | Admitting: Family Medicine

## 2022-04-15 VITALS — BP 135/80 | HR 61 | Temp 97.8°F | Ht 67.0 in | Wt 197.4 lb

## 2022-04-15 DIAGNOSIS — M25512 Pain in left shoulder: Secondary | ICD-10-CM | POA: Diagnosis not present

## 2022-04-15 DIAGNOSIS — I10 Essential (primary) hypertension: Secondary | ICD-10-CM | POA: Diagnosis not present

## 2022-04-15 DIAGNOSIS — E785 Hyperlipidemia, unspecified: Secondary | ICD-10-CM

## 2022-04-15 DIAGNOSIS — E1159 Type 2 diabetes mellitus with other circulatory complications: Secondary | ICD-10-CM

## 2022-04-15 LAB — BAYER DCA HB A1C WAIVED: HB A1C (BAYER DCA - WAIVED): 6.7 % — ABNORMAL HIGH (ref 4.8–5.6)

## 2022-04-15 MED ORDER — METFORMIN HCL ER 500 MG PO TB24: 500.0000 mg | ORAL_TABLET | Freq: Every day | ORAL | 3 refills | Status: DC

## 2022-04-15 MED ORDER — BETAMETHASONE SOD PHOS & ACET 6 (3-3) MG/ML IJ SUSP
6.0000 mg | Freq: Once | INTRAMUSCULAR | Status: AC
  Administered 2022-04-15: 6 mg via INTRAMUSCULAR

## 2022-04-15 MED ORDER — BLOOD GLUCOSE MONITOR KIT: PACK | 0 refills | Status: DC

## 2022-04-15 NOTE — Progress Notes (Signed)
Subjective:  Patient ID: Anthony Austin, male    DOB: 1961/06/02  Age: 61 y.o. MRN: [id number]  CC: Medical Management of Chronic Issues   HPI Anthony Austin presents forFollow-up of diabetes. Patient denies symptoms such as polyuria, polydipsia, excessive hunger, nausea No significant hypoglycemic spells noted. Medications reviewed. Pt reports taking them regularly without complication/adverse reaction being reported today.   Continued pain from left shoulder posterior History Anthony Austin has a past medical history of Hyperlipidemia, Hypertension, and Prostate CA ([place]) (2005).   He has a past surgical history that includes Prostate surgery; Prostatectomy (2005); and Prostatectomy.   His family history includes Cancer in his father; Diabetes in his mother; Hypertension in his father and mother; Stroke in his mother.He reports that he has never smoked. He has never used smokeless tobacco. He reports that he does not drink alcohol and does not use drugs.  Current Outpatient Medications on File Prior to Visit  Medication Sig Dispense Refill   atorvastatin (LIPITOR) 40 MG tablet Take 1 tablet (40 mg total) by mouth daily. For cholesterol 90 tablet 2   hydrochlorothiazide (HYDRODIURIL) 25 MG tablet Take 1 tablet (25 mg total) by mouth daily. 90 tablet 2   lisinopril (ZESTRIL) 40 MG tablet Take 1 tablet (40 mg total) by mouth daily. 90 tablet 3   meclizine (ANTIVERT) 25 MG tablet Take 1 tablet (25 mg total) by mouth 3 (three) times daily as needed for dizziness. 30 tablet 0   No current facility-administered medications on file prior to visit.    ROS Review of Systems  Constitutional:  Negative for fever.  Respiratory:  Negative for shortness of breath.   Cardiovascular:  Negative for chest pain.  Musculoskeletal:  Negative for arthralgias.  Skin:  Negative for rash.    Objective:  BP 135/80   Pulse 61   Temp 97.8 F (36.6 C)   Ht 5' 7"  (1.702 m)   Wt 197 lb 6.4 oz (89.5 kg)   SpO2 97%    BMI 30.92 kg/m   BP Readings from Last 3 Encounters:  04/15/22 135/80  03/26/22 132/86  03/17/22 (!) 152/79    Wt Readings from Last 3 Encounters:  04/15/22 197 lb 6.4 oz (89.5 kg)  03/26/22 195 lb 12.8 oz (88.8 kg)  03/16/22 195 lb (88.5 kg)     Physical Exam Vitals reviewed.  Constitutional:      Appearance: He is well-developed.  HENT:     Head: Normocephalic and atraumatic.     Right Ear: External ear normal.     Left Ear: External ear normal.     Mouth/Throat:     Pharynx: No oropharyngeal exudate or posterior oropharyngeal erythema.  Eyes:     Pupils: Pupils are equal, round, and reactive to light.  Cardiovascular:     Rate and Rhythm: Normal rate and regular rhythm.     Heart sounds: No murmur heard. Pulmonary:     Effort: No respiratory distress.     Breath sounds: Normal breath sounds.  Musculoskeletal:     Cervical back: Normal range of motion and neck supple.  Neurological:     Mental Status: He is alert and oriented to person, place, and time.       Assessment & Plan:   Anthony Austin was seen today for medical management of chronic issues.  Diagnoses and all orders for this visit:  Essential hypertension -     CBC with Differential/Platelet -     CMP14+EGFR  Type 2 diabetes  mellitus with other circulatory complication, without long-term current use of insulin (HCC) -     Bayer DCA Hb A1c Waived  Hyperlipidemia, unspecified hyperlipidemia type -     Lipid panel  Acute pain of left shoulder -     Ambulatory referral to Physical Therapy -     betamethasone acetate-betamethasone sodium phosphate (CELESTONE) injection 6 mg  Other orders -     metFORMIN (GLUCOPHAGE-XR) 500 MG 24 hr tablet; Take 1 tablet (500 mg total) by mouth daily with breakfast. -     blood glucose meter kit and supplies KIT; Dispense based on patient and insurance preference. Use up to four times daily as directed. (FOR ICD-10 : E11.9      I have discontinued Anthony Austin's  potassium chloride SA. I have also changed his metFORMIN. Additionally, I am having him start on blood glucose meter kit and supplies. Lastly, I am having him maintain his atorvastatin, hydrochlorothiazide, lisinopril, and meclizine. We will continue to administer betamethasone acetate-betamethasone sodium phosphate.  Meds ordered this encounter  Medications   metFORMIN (GLUCOPHAGE-XR) 500 MG 24 hr tablet    Sig: Take 1 tablet (500 mg total) by mouth daily with breakfast.    Dispense:  90 tablet    Refill:  3   blood glucose meter kit and supplies KIT    Sig: Dispense based on patient and insurance preference. Use up to four times daily as directed. (FOR ICD-10 : E11.9    Dispense:  1 each    Refill:  0    Order Specific Question:   Number of strips    Answer:   100    Order Specific Question:   Number of lancets    Answer:   100   betamethasone acetate-betamethasone sodium phosphate (CELESTONE) injection 6 mg     Follow-up: Return in about 3 months (around 07/16/2022) for diabetes.  Claretta Fraise, M.D.

## 2022-04-16 LAB — CBC WITH DIFFERENTIAL/PLATELET
Basophils Absolute: 0 10*3/uL (ref 0.0–0.2)
Basos: 1 %
EOS (ABSOLUTE): 0.1 10*3/uL (ref 0.0–0.4)
Eos: 2 %
Hematocrit: 44.9 % (ref 37.5–51.0)
Hemoglobin: 15.6 g/dL (ref 13.0–17.7)
Immature Grans (Abs): 0 10*3/uL (ref 0.0–0.1)
Immature Granulocytes: 0 %
Lymphocytes Absolute: 1.9 10*3/uL (ref 0.7–3.1)
Lymphs: 38 %
MCH: 33.4 pg — ABNORMAL HIGH (ref 26.6–33.0)
MCHC: 34.7 g/dL (ref 31.5–35.7)
MCV: 96 fL (ref 79–97)
Monocytes Absolute: 0.4 10*3/uL (ref 0.1–0.9)
Monocytes: 8 %
Neutrophils Absolute: 2.5 10*3/uL (ref 1.4–7.0)
Neutrophils: 51 %
Platelets: 206 10*3/uL (ref 150–450)
RBC: 4.67 x10E6/uL (ref 4.14–5.80)
RDW: 12.1 % (ref 11.6–15.4)
WBC: 4.9 10*3/uL (ref 3.4–10.8)

## 2022-04-16 LAB — CMP14+EGFR
ALT: 59 IU/L — ABNORMAL HIGH (ref 0–44)
AST: 37 IU/L (ref 0–40)
Albumin/Globulin Ratio: 2.1 (ref 1.2–2.2)
Albumin: 4.4 g/dL (ref 3.9–4.9)
Alkaline Phosphatase: 82 IU/L (ref 44–121)
BUN/Creatinine Ratio: 16 (ref 10–24)
BUN: 18 mg/dL (ref 8–27)
Bilirubin Total: 0.8 mg/dL (ref 0.0–1.2)
CO2: 28 mmol/L (ref 20–29)
Calcium: 9.8 mg/dL (ref 8.6–10.2)
Chloride: 99 mmol/L (ref 96–106)
Creatinine, Ser: 1.13 mg/dL (ref 0.76–1.27)
Globulin, Total: 2.1 g/dL (ref 1.5–4.5)
Glucose: 133 mg/dL — ABNORMAL HIGH (ref 70–99)
Potassium: 4.5 mmol/L (ref 3.5–5.2)
Sodium: 140 mmol/L (ref 134–144)
Total Protein: 6.5 g/dL (ref 6.0–8.5)
eGFR: 74 mL/min/{1.73_m2} (ref >59)

## 2022-04-16 LAB — LIPID PANEL
Chol/HDL Ratio: 3.2 ratio (ref 0.0–5.0)
Cholesterol, Total: 122 mg/dL (ref 100–199)
HDL: 38 mg/dL — ABNORMAL LOW (ref >39)
LDL Chol Calc (NIH): 62 mg/dL (ref 0–99)
Triglycerides: 122 mg/dL (ref 0–149)
VLDL Cholesterol Cal: 22 mg/dL (ref 5–40)

## 2022-04-23 ENCOUNTER — Encounter: Payer: Self-pay | Admitting: Physical Therapy

## 2022-04-23 ENCOUNTER — Other Ambulatory Visit: Payer: Self-pay

## 2022-04-23 ENCOUNTER — Ambulatory Visit: Payer: BC Managed Care – PPO | Attending: Family Medicine | Admitting: Physical Therapy

## 2022-04-23 DIAGNOSIS — M25512 Pain in left shoulder: Secondary | ICD-10-CM | POA: Insufficient documentation

## 2022-04-23 NOTE — Therapy (Signed)
OUTPATIENT PHYSICAL THERAPY THORACOLUMBAR EVALUATION   Patient Name: CHOSEN GESKE MRN: [id number] DOB:[birth year], 61 y.o., male Today's Date: 04/23/2022   PT End of Session - 04/23/22 1139     Visit Number 1    Number of Visits 8    Date for PT Re-Evaluation 05/21/22    PT Start Time 1107    Activity Tolerance Patient tolerated treatment well    Behavior During Therapy [facility] for tasks assessed/performed             Past Medical History:  Diagnosis Date   Hyperlipidemia    Hypertension    Prostate CA ([place]) 2005   Past Surgical History:  Procedure Laterality Date   PROSTATE SURGERY     PROSTATECTOMY  2005   PROSTATECTOMY     Patient Active Problem List   Diagnosis Date Noted   BMI 29.0-29.9,adult 02/13/2019   Vitamin D deficiency 02/13/2019   Paresthesia of both feet 02/13/2019   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  Rationale for Evaluation and Treatment: Rehabilitation  THERAPY DIAG:  Acute pain of left shoulder  ONSET DATE: ~4 weeks ago.  SUBJECTIVE:                                                                                                                                                                                           SUBJECTIVE STATEMENT: The patient presents to the clinic today with c/o left upper back and left shoulder pain.  He relates this lifting a heavy object at work about 4 weeks ago.  At rest his pain is a 0/10 today but left UE use increases pain.  He has received two injections which have been helpful.  An ED visit r/o pain related to cardiac.  He was having pain into the deltoids but this has improved since the injections.  PERTINENT HISTORY:  Unremarkable.  PAIN:  Are you having pain? No  PRECAUTIONS: None  WEIGHT BEARING RESTRICTIONS: No  FALLS:  Has patient fallen in last 6 months? No  LIVING  ENVIRONMENT: Lives in: House/apartment  PLOF: Independent  PATIENT GOALS: Not have pain.   OBJECTIVE:   DIAGNOSTIC FINDINGS:  IMPRESSION: Findings suggest remote trauma to the distal clavicle. No acute fracture or dislocation.      POSTURE: No Significant postural limitations  PALPATION: Tender to palpation over musculature from C7 to T3 on the left toward medial scapular border.   ROM:  Full cervical rotation bilaterally and full left UE range of motion.  (Blank rows =  not tested)  STRENGTH:  Left UE strength is normal.  SPECIAL TESTS:  Negative left Speed's test.  TODAY'S TREATMENT:                                                                                                                              DATE: HMP and IFC at 80-150 Hz on 40% scan x 20 minutes to patient's left upper thoracic region.     ASSESSMENT:  CLINICAL IMPRESSION: The patient presents to O[facility]T with c/o left shoulder and upper back pain that has been ongoing for over a month.  The patient feels this is related to lifting at work.  Two recent injections have been quite helpful.  His CC is palpable pain over the musculature on the left from C7 to T3.  His left UE strength is normal as is his range of motion.  He demonstrated a negative left Speed's test.  Patient will benefit from skilled physical therapy intervention to address pain and deficits.  OBJECTIVE IMPAIRMENTS: decreased activity tolerance.   ACTIVITY LIMITATIONS: lifting  REHAB POTENTIAL: Excellent  CLINICAL DECISION MAKING: Stable/uncomplicated  EVALUATION COMPLEXITY: Low   GOALS:  LONG TERM GOALS: Target date: 05/21/2022  Ind with a HEP. Baseline:  Goal status: INITIAL  2.  Perform ADL's with left shoulder/upper back pain not > 2/10. Baseline:  Goal status: INITIAL  PLAN:  PT FREQUENCY: 1-2x/week  PT DURATION: 4 weeks  PLANNED INTERVENTIONS: Therapeutic exercises, Therapeutic activity, Patient/Family education,  Self Care, Dry Needling, Electrical stimulation, Cryotherapy, Moist heat, Ultrasound, and Manual therapy.  PLAN FOR NEXT SESSION: Combo e'stim/US, STW/M, scapular and deltoid therapeutic ex.  Kenyotta Dorfman, [place], PT 04/23/2022, 12:33 PM

## 2022-04-27 ENCOUNTER — Ambulatory Visit: Payer: BC Managed Care – PPO

## 2022-04-27 DIAGNOSIS — M25512 Pain in left shoulder: Secondary | ICD-10-CM

## 2022-04-27 NOTE — Therapy (Signed)
OUTPATIENT PHYSICAL THERAPY THORACOLUMBAR EVALUATION   Patient Name: Anthony Austin MRN: [id number] DOB:10/18/1960, 61 y.o., male Today's Date: 04/27/2022   PT End of Session - 04/27/22 1431     Visit Number 2    Number of Visits 8    Date for PT Re-Evaluation 05/21/22    PT Start Time 1430    PT Stop Time 1500    PT Time Calculation (min) 30 min    Activity Tolerance Patient tolerated treatment well    Behavior During Therapy [facility] for tasks assessed/performed             Past Medical History:  Diagnosis Date   Hyperlipidemia    Hypertension    Prostate CA ([place]) 2005   Past Surgical History:  Procedure Laterality Date   PROSTATE SURGERY     PROSTATECTOMY  2005   PROSTATECTOMY     Patient Active Problem List   Diagnosis Date Noted   BMI 29.0-29.9,adult 02/13/2019   Vitamin D deficiency 02/13/2019   Paresthesia of both feet 02/13/2019   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  Rationale for Evaluation and Treatment: Rehabilitation  THERAPY DIAG:  Acute pain of left shoulder  ONSET DATE: ~4 weeks ago.  SUBJECTIVE:                                                                                                                                                                                           SUBJECTIVE STATEMENT: Pt denies any pain today.   PERTINENT HISTORY:  Unremarkable.  PAIN:  Are you having pain? No  PRECAUTIONS: None  WEIGHT BEARING RESTRICTIONS: No  FALLS:  Has patient fallen in last 6 months? No  LIVING ENVIRONMENT: Lives in: House/apartment  PLOF: Independent  PATIENT GOALS: Not have pain.   OBJECTIVE:   DIAGNOSTIC FINDINGS:  IMPRESSION: Findings suggest remote trauma to the distal clavicle. No acute fracture or dislocation.      POSTURE: No Significant postural limitations  PALPATION: Tender to  palpation over musculature from C7 to T3 on the left toward medial scapular border.   ROM:  Full cervical rotation bilaterally and full left UE range of motion.  (Blank rows = not tested)  STRENGTH:  Left UE strength is normal.  SPECIAL TESTS:  Negative left Speed's test.  TODAY'STREATMENT:  EXERCISE LOG  Exercise Repetitions and Resistance Comments  UBE 90 RPMs x 8 mins (forward/backward)   Counter Push Ups X10 reps   Shoulder Rows Green T-band x Fatigue   Shoulder Extension Green T-band x Fatigue   ER/IR Green T-band x Fatigue   Horizontal Abduction Green T-band x Fatigue   Flexion 4# x 25 reps   Abduction 4# x 25 reps    Blank cell = exercise not performed today    ASSESSMENT:  CLINICAL IMPRESSION: Pt arrives for today's treatment session denying any pain.  Pt requiring min cues for proper technique with all newly added exercises.  Pt demonstrating above average strength and able to perform all reps asked of him.  Pt given printed HEP of all exercises performed today and instructed to perform daily.  If no pain at next session, will possibly go on hold.   OBJECTIVE IMPAIRMENTS: decreased activity tolerance.   ACTIVITY LIMITATIONS: lifting  REHAB POTENTIAL: Excellent  CLINICAL DECISION MAKING: Stable/uncomplicated  EVALUATION COMPLEXITY: Low   GOALS:  LONG TERM GOALS: Target date: 05/25/2022  Ind with a HEP. Baseline:  Goal status: INITIAL  2.  Perform ADL's with left shoulder/upper back pain not > 2/10. Baseline:  Goal status: INITIAL  PLAN:  PT FREQUENCY: 1-2x/week  PT DURATION: 4 weeks  PLANNED INTERVENTIONS: Therapeutic exercises, Therapeutic activity, Patient/Family education, Self Care, Dry Needling, Electrical stimulation, Cryotherapy, Moist heat, Ultrasound, and Manual therapy.  PLAN FOR  NEXT SESSION: Combo e'stim/US, STW/M, scapular and deltoid therapeutic ex.  Kathrynn Ducking, PTA 04/27/2022, 3:05 PM

## 2022-04-30 ENCOUNTER — Encounter: Payer: BC Managed Care – PPO | Admitting: Physical Therapy

## 2022-06-15 ENCOUNTER — Other Ambulatory Visit: Payer: Self-pay | Admitting: Family Medicine

## 2022-06-19 ENCOUNTER — Other Ambulatory Visit: Payer: Self-pay | Admitting: Family Medicine

## 2022-07-16 ENCOUNTER — Ambulatory Visit (INDEPENDENT_AMBULATORY_CARE_PROVIDER_SITE_OTHER): Payer: BC Managed Care – PPO | Admitting: Family Medicine

## 2022-07-16 ENCOUNTER — Encounter: Payer: Self-pay | Admitting: Family Medicine

## 2022-07-16 VITALS — BP 120/72 | HR 58 | Temp 98.0°F | Ht 67.0 in | Wt 198.6 lb

## 2022-07-16 DIAGNOSIS — Z125 Encounter for screening for malignant neoplasm of prostate: Secondary | ICD-10-CM | POA: Diagnosis not present

## 2022-07-16 DIAGNOSIS — E559 Vitamin D deficiency, unspecified: Secondary | ICD-10-CM | POA: Diagnosis not present

## 2022-07-16 DIAGNOSIS — I10 Essential (primary) hypertension: Secondary | ICD-10-CM | POA: Diagnosis not present

## 2022-07-16 DIAGNOSIS — E785 Hyperlipidemia, unspecified: Secondary | ICD-10-CM

## 2022-07-16 DIAGNOSIS — E1159 Type 2 diabetes mellitus with other circulatory complications: Secondary | ICD-10-CM

## 2022-07-16 LAB — BAYER DCA HB A1C WAIVED: HB A1C (BAYER DCA - WAIVED): 6.6 % — ABNORMAL HIGH (ref 4.8–5.6)

## 2022-07-16 MED ORDER — ATORVASTATIN CALCIUM 40 MG PO TABS: 40.0000 mg | ORAL_TABLET | Freq: Every day | ORAL | 3 refills | Status: DC

## 2022-07-16 MED ORDER — HYDROCHLOROTHIAZIDE 25 MG PO TABS: 25.0000 mg | ORAL_TABLET | Freq: Every day | ORAL | 3 refills | Status: DC

## 2022-07-16 MED ORDER — LISINOPRIL 40 MG PO TABS: 40.0000 mg | ORAL_TABLET | Freq: Every day | ORAL | 2 refills | Status: DC

## 2022-07-16 NOTE — Progress Notes (Signed)
Subjective:  Patient ID: Anthony Austin,  male    DOB: [birth year]  Age: 62 y.o.    CC: Medical Management of Chronic Issues   HPI Anthony Austin presents for  follow-up of hypertension. Patient has no history of headache chest pain or shortness of breath or recent cough. Patient also denies symptoms of TIA such as numbness weakness lateralizing. Patient denies side effects from medication. States taking it regularly.  Patient also  in for follow-up of elevated cholesterol. Doing well without complaints on current medication. Denies side effects  including myalgia and arthralgia and nausea. Also in today for liver function testing. Currently no chest pain, shortness of breath or other cardiovascular related symptoms noted.  Follow-up of diabetes. Patient does check blood sugar at home. Readings run between 100 and 140. Log reviewed.  Patient denies symptoms such as excessive hunger or urinary frequency, excessive hunger, nausea No significant hypoglycemic spells noted. Medications reviewed. Pt reports taking them regularly. Pt. denies complication/adverse reaction today.    History Anthony Austin has a past medical history of Hyperlipidemia, Hypertension, and Prostate CA ([place]) (2005).   Anthony Austin has a past surgical history that includes Prostate surgery; Prostatectomy (2005); and Prostatectomy.   Anthony Austin family history includes Cancer in Anthony Austin father; Diabetes in Anthony Austin mother; Hypertension in Anthony Austin father and mother; Stroke in Anthony Austin mother.Anthony Austin reports that Anthony Austin has never smoked. Anthony Austin has never used smokeless tobacco. Anthony Austin reports that Anthony Austin does not drink alcohol and does not use drugs.  Current Outpatient Medications on File Prior to Visit  Medication Sig Dispense Refill   Accu-Chek Softclix Lancets lancets Check BS up to 4 times daily Dx E11.59 400 each 3   blood glucose meter kit and supplies KIT Dispense based on patient and insurance preference. Use up to four times daily as directed. (FOR ICD-10 : E11.9 1 each 0    glucose blood (ACCU-CHEK GUIDE) test strip USE UP TO FOUR TIMES DAILY AS DIRECTED 100 strip 1   meclizine (ANTIVERT) 25 MG tablet Take 1 tablet (25 mg total) by mouth 3 (three) times daily as needed for dizziness. 30 tablet 0   metFORMIN (GLUCOPHAGE-XR) 500 MG 24 hr tablet Take 1 tablet (500 mg total) by mouth daily with breakfast. 90 tablet 3   No current facility-administered medications on file prior to visit.    ROS Review of Systems  Constitutional:  Negative for fever.  Respiratory:  Negative for shortness of breath.   Cardiovascular:  Negative for chest pain.  Musculoskeletal:  Negative for arthralgias.  Skin:  Negative for rash.    Objective:  BP 120/72   Pulse (!) 58   Temp 98 F (36.7 C)   Ht '5\' 7"'$  (1.702 m)   Wt 198 lb 9.6 oz (90.1 kg)   SpO2 98%   BMI 31.11 kg/m   BP Readings from Last 3 Encounters:  07/16/22 120/72  04/15/22 135/80  03/26/22 132/86    Wt Readings from Last 3 Encounters:  07/16/22 198 lb 9.6 oz (90.1 kg)  04/15/22 197 lb 6.4 oz (89.5 kg)  03/26/22 195 lb 12.8 oz (88.8 kg)     Physical Exam Vitals reviewed.  Constitutional:      Appearance: Anthony Austin is well-developed.  HENT:     Head: Normocephalic and atraumatic.     Right Ear: External ear normal.     Left Ear: External ear normal.     Mouth/Throat:     Pharynx: No oropharyngeal exudate or posterior oropharyngeal erythema.  Eyes:  Pupils: Pupils are equal, round, and reactive to light.  Cardiovascular:     Rate and Rhythm: Normal rate and regular rhythm.     Heart sounds: No murmur heard. Pulmonary:     Effort: No respiratory distress.     Breath sounds: Normal breath sounds.  Musculoskeletal:     Cervical back: Normal range of motion and neck supple.  Neurological:     Mental Status: Anthony Austin is alert and oriented to person, place, and time.     Diabetic Foot Exam - Simple   No data filed     Lab Results  Component Value Date   HGBA1C 6.7 (H) 04/15/2022   HGBA1C 6.0 (H)  10/15/2021   HGBA1C 6.3 (H) 07/10/2021    Assessment & Plan:   Anthony Austin was seen today for medical management of chronic issues.  Diagnoses and all orders for this visit:  Type 2 diabetes mellitus with other circulatory complication, without long-term current use of insulin (HCC) -     Bayer DCA Hb A1c Waived -     Microalbumin / creatinine urine ratio  Essential hypertension -     CBC with Differential/Platelet -     CMP14+EGFR -     hydrochlorothiazide (HYDRODIURIL) 25 MG tablet; Take 1 tablet (25 mg total) by mouth daily. -     lisinopril (ZESTRIL) 40 MG tablet; Take 1 tablet (40 mg total) by mouth daily.  Hyperlipidemia, unspecified hyperlipidemia type -     Lipid panel  Vitamin D deficiency -     VITAMIN D 25 Hydroxy (Vit-D Deficiency, Fractures)  Prostate cancer screening -     PSA, total and free  Other orders -     atorvastatin (LIPITOR) 40 MG tablet; Take 1 tablet (40 mg total) by mouth daily. For cholesterol   I am having Anthony Austin maintain Anthony Austin meclizine, metFORMIN, blood glucose meter kit and supplies, Accu-Chek Guide, Accu-Chek Softclix Lancets, atorvastatin, hydrochlorothiazide, and lisinopril.  Meds ordered this encounter  Medications   atorvastatin (LIPITOR) 40 MG tablet    Sig: Take 1 tablet (40 mg total) by mouth daily. For cholesterol    Dispense:  90 tablet    Refill:  3   hydrochlorothiazide (HYDRODIURIL) 25 MG tablet    Sig: Take 1 tablet (25 mg total) by mouth daily.    Dispense:  90 tablet    Refill:  3   lisinopril (ZESTRIL) 40 MG tablet    Sig: Take 1 tablet (40 mg total) by mouth daily.    Dispense:  90 tablet    Refill:  2     Follow-up: Return in about 3 months (around 10/15/2022).  Claretta Fraise, M.D.

## 2022-07-17 LAB — CMP14+EGFR
ALT: 60 IU/L — ABNORMAL HIGH (ref 0–44)
AST: 34 IU/L (ref 0–40)
Albumin/Globulin Ratio: 2.2 (ref 1.2–2.2)
Albumin: 4.6 g/dL (ref 3.9–4.9)
Alkaline Phosphatase: 80 IU/L (ref 44–121)
BUN/Creatinine Ratio: 16 (ref 10–24)
BUN: 18 mg/dL (ref 8–27)
Bilirubin Total: 0.8 mg/dL (ref 0.0–1.2)
CO2: 26 mmol/L (ref 20–29)
Calcium: 9.7 mg/dL (ref 8.6–10.2)
Chloride: 99 mmol/L (ref 96–106)
Creatinine, Ser: 1.13 mg/dL (ref 0.76–1.27)
Globulin, Total: 2.1 g/dL (ref 1.5–4.5)
Glucose: 124 mg/dL — ABNORMAL HIGH (ref 70–99)
Potassium: 4.3 mmol/L (ref 3.5–5.2)
Sodium: 141 mmol/L (ref 134–144)
Total Protein: 6.7 g/dL (ref 6.0–8.5)
eGFR: 73 mL/min/{1.73_m2} (ref >59)

## 2022-07-17 LAB — PSA, TOTAL AND FREE
PSA, Free Pct: 4.7 %
PSA, Free: 0.07 ng/mL
Prostate Specific Ag, Serum: 1.5 ng/mL (ref 0.0–4.0)

## 2022-07-17 LAB — CBC WITH DIFFERENTIAL/PLATELET
Basophils Absolute: 0.1 10*3/uL (ref 0.0–0.2)
Basos: 1 %
EOS (ABSOLUTE): 0.2 10*3/uL (ref 0.0–0.4)
Eos: 3 %
Hematocrit: 46.6 % (ref 37.5–51.0)
Hemoglobin: 16 g/dL (ref 13.0–17.7)
Immature Grans (Abs): 0 10*3/uL (ref 0.0–0.1)
Immature Granulocytes: 0 %
Lymphocytes Absolute: 2.3 10*3/uL (ref 0.7–3.1)
Lymphs: 36 %
MCH: 32.9 pg (ref 26.6–33.0)
MCHC: 34.3 g/dL (ref 31.5–35.7)
MCV: 96 fL (ref 79–97)
Monocytes Absolute: 0.5 10*3/uL (ref 0.1–0.9)
Monocytes: 8 %
Neutrophils Absolute: 3.3 10*3/uL (ref 1.4–7.0)
Neutrophils: 52 %
Platelets: 212 10*3/uL (ref 150–450)
RBC: 4.87 x10E6/uL (ref 4.14–5.80)
RDW: 11.7 % (ref 11.6–15.4)
WBC: 6.3 10*3/uL (ref 3.4–10.8)

## 2022-07-17 LAB — LIPID PANEL
Chol/HDL Ratio: 3 ratio (ref 0.0–5.0)
Cholesterol, Total: 111 mg/dL (ref 100–199)
HDL: 37 mg/dL — ABNORMAL LOW (ref >39)
LDL Chol Calc (NIH): 56 mg/dL (ref 0–99)
Triglycerides: 93 mg/dL (ref 0–149)
VLDL Cholesterol Cal: 18 mg/dL (ref 5–40)

## 2022-07-17 LAB — VITAMIN D 25 HYDROXY (VIT D DEFICIENCY, FRACTURES): Vit D, 25-Hydroxy: 34.1 ng/mL (ref 30.0–100.0)

## 2022-07-18 LAB — MICROALBUMIN / CREATININE URINE RATIO
Creatinine, Urine: 61 mg/dL
Microalb/Creat Ratio: 5 mg/g creat (ref 0–29)
Microalbumin, Urine: 3.3 ug/mL

## 2022-07-20 NOTE — Progress Notes (Signed)
Hello Adnan,  Your lab result is normal and/or stable.Some minor variations that are not significant are commonly marked abnormal, but do not represent any medical problem for you.  Best regards, Haneefah Venturini, M.D.

## 2022-08-03 ENCOUNTER — Other Ambulatory Visit: Payer: Self-pay | Admitting: Family Medicine

## 2022-09-28 ENCOUNTER — Encounter: Payer: Self-pay | Admitting: Family Medicine

## 2022-09-28 ENCOUNTER — Ambulatory Visit (INDEPENDENT_AMBULATORY_CARE_PROVIDER_SITE_OTHER): Payer: BC Managed Care – PPO | Admitting: Family Medicine

## 2022-09-28 VITALS — BP 136/81 | HR 70 | Temp 98.2°F | Ht 67.0 in | Wt 199.4 lb

## 2022-09-28 DIAGNOSIS — J0100 Acute maxillary sinusitis, unspecified: Secondary | ICD-10-CM

## 2022-09-28 MED ORDER — BETAMETHASONE SOD PHOS & ACET 6 (3-3) MG/ML IJ SUSP
6.0000 mg | Freq: Once | INTRAMUSCULAR | Status: AC
  Administered 2022-09-28: 6 mg via INTRAMUSCULAR

## 2022-09-28 MED ORDER — AMOXICILLIN-POT CLAVULANATE 875-125 MG PO TABS: 1.0000 | ORAL_TABLET | Freq: Two times a day (BID) | ORAL | 0 refills | Status: DC

## 2022-09-28 NOTE — Progress Notes (Signed)
Chief Complaint  Patient presents with   Sinusitis    HPI  Patient presents today for Symptoms include congestion, facial pain, nasal congestion, non productive cough, post nasal drip and sinus pressure. There is no fever, chills, or sweats. Onset of symptoms was a few days ago, gradually worsening since that time. Lots of dust, pollen exposure. Facial pain especially on the right.    PMH: Smoking status noted ROS: Per HPI  Objective: BP 136/81   Pulse 70   Temp 98.2 F (36.8 C)   Ht 5\' 7"  (1.702 m)   Wt 199 lb 6.4 oz (90.4 kg)   SpO2 96%   BMI 31.23 kg/m  Gen: NAD, alert, cooperative with exam HEENT: NCAT, EOMI, PERRL. Nassal passages swollen. Max sinuses tender CV: RRR, good S1/S2, no murmur Resp: CTABL, no wheezes, non-labored Ext: No edema, warm Neuro: Alert and oriented, No gross deficits  Assessment and plan:  1. Acute maxillary sinusitis, recurrence not specified     Meds ordered this encounter  Medications   amoxicillin-clavulanate (AUGMENTIN) 875-125 MG tablet    Sig: Take 1 tablet by mouth 2 (two) times daily. Take all of this medication    Dispense:  20 tablet    Refill:  0   betamethasone acetate-betamethasone sodium phosphate (CELESTONE) injection 6 mg    No orders of the defined types were placed in this encounter.   Follow up as needed.  Mechele Claude, MD

## 2022-10-19 ENCOUNTER — Ambulatory Visit: Payer: BC Managed Care – PPO | Admitting: Family Medicine

## 2022-10-28 ENCOUNTER — Encounter: Payer: Self-pay | Admitting: Family Medicine

## 2022-10-28 ENCOUNTER — Ambulatory Visit (INDEPENDENT_AMBULATORY_CARE_PROVIDER_SITE_OTHER): Payer: BC Managed Care – PPO | Admitting: Family Medicine

## 2022-10-28 VITALS — BP 125/82 | HR 63 | Temp 97.8°F | Wt 201.2 lb

## 2022-10-28 DIAGNOSIS — E1159 Type 2 diabetes mellitus with other circulatory complications: Secondary | ICD-10-CM | POA: Diagnosis not present

## 2022-10-28 DIAGNOSIS — Z1211 Encounter for screening for malignant neoplasm of colon: Secondary | ICD-10-CM

## 2022-10-28 DIAGNOSIS — I152 Hypertension secondary to endocrine disorders: Secondary | ICD-10-CM | POA: Diagnosis not present

## 2022-10-28 DIAGNOSIS — E785 Hyperlipidemia, unspecified: Secondary | ICD-10-CM

## 2022-10-28 DIAGNOSIS — R6889 Other general symptoms and signs: Secondary | ICD-10-CM | POA: Diagnosis not present

## 2022-10-28 DIAGNOSIS — I10 Essential (primary) hypertension: Secondary | ICD-10-CM

## 2022-10-28 DIAGNOSIS — K591 Functional diarrhea: Secondary | ICD-10-CM

## 2022-10-28 DIAGNOSIS — R152 Fecal urgency: Secondary | ICD-10-CM

## 2022-10-28 DIAGNOSIS — R197 Diarrhea, unspecified: Secondary | ICD-10-CM | POA: Diagnosis not present

## 2022-10-28 DIAGNOSIS — Z7984 Long term (current) use of oral hypoglycemic drugs: Secondary | ICD-10-CM

## 2022-10-28 LAB — CBC WITH DIFFERENTIAL/PLATELET
Basophils Absolute: 0 10*3/uL (ref 0.0–0.2)
Basos: 1 %
EOS (ABSOLUTE): 0.2 10*3/uL (ref 0.0–0.4)
Eos: 3 %
Hematocrit: 43.5 % (ref 37.5–51.0)
Hemoglobin: 14.7 g/dL (ref 13.0–17.7)
Immature Grans (Abs): 0 10*3/uL (ref 0.0–0.1)
Immature Granulocytes: 0 %
Lymphocytes Absolute: 2.4 10*3/uL (ref 0.7–3.1)
Lymphs: 40 %
MCH: 31.9 pg (ref 26.6–33.0)
MCHC: 33.8 g/dL (ref 31.5–35.7)
MCV: 94 fL (ref 79–97)
Monocytes Absolute: 0.4 10*3/uL (ref 0.1–0.9)
Monocytes: 7 %
Neutrophils Absolute: 3 10*3/uL (ref 1.4–7.0)
Neutrophils: 49 %
Platelets: 209 10*3/uL (ref 150–450)
RBC: 4.61 x10E6/uL (ref 4.14–5.80)
RDW: 12.1 % (ref 11.6–15.4)
WBC: 6.1 10*3/uL (ref 3.4–10.8)

## 2022-10-28 LAB — CMP14+EGFR
ALT: 62 IU/L — ABNORMAL HIGH (ref 0–44)
AST: 44 IU/L — ABNORMAL HIGH (ref 0–40)
Albumin/Globulin Ratio: 2 (ref 1.2–2.2)
Albumin: 4.3 g/dL (ref 3.9–4.9)
Alkaline Phosphatase: 77 IU/L (ref 44–121)
BUN/Creatinine Ratio: 17 (ref 10–24)
BUN: 19 mg/dL (ref 8–27)
Bilirubin Total: 0.9 mg/dL (ref 0.0–1.2)
CO2: 25 mmol/L (ref 20–29)
Calcium: 9.6 mg/dL (ref 8.6–10.2)
Chloride: 101 mmol/L (ref 96–106)
Creatinine, Ser: 1.1 mg/dL (ref 0.76–1.27)
Globulin, Total: 2.1 g/dL (ref 1.5–4.5)
Glucose: 131 mg/dL — ABNORMAL HIGH (ref 70–99)
Potassium: 4.9 mmol/L (ref 3.5–5.2)
Sodium: 142 mmol/L (ref 134–144)
Total Protein: 6.4 g/dL (ref 6.0–8.5)
eGFR: 76 mL/min/{1.73_m2} (ref >59)

## 2022-10-28 LAB — LIPID PANEL
Chol/HDL Ratio: 3.6 ratio (ref 0.0–5.0)
Cholesterol, Total: 116 mg/dL (ref 100–199)
HDL: 32 mg/dL — ABNORMAL LOW (ref >39)
LDL Chol Calc (NIH): 63 mg/dL (ref 0–99)
Triglycerides: 112 mg/dL (ref 0–149)
VLDL Cholesterol Cal: 21 mg/dL (ref 5–40)

## 2022-10-28 LAB — BAYER DCA HB A1C WAIVED: HB A1C (BAYER DCA - WAIVED): 7.1 % — ABNORMAL HIGH (ref 4.8–5.6)

## 2022-10-28 MED ORDER — SITAGLIPTIN PHOSPHATE 100 MG PO TABS: 100.0000 mg | ORAL_TABLET | Freq: Every day | ORAL | 2 refills | Status: DC

## 2022-10-28 NOTE — Progress Notes (Signed)
Subjective:  Patient ID: Anthony Austin, male    DOB: 08/06/1960  Age: 62 y.o. MRN: [id number]  CC: Medical Management of Chronic Issues   HPI Anthony Austin presents forFollow-up of diabetes. Patient checks blood sugar at home.   117 fasting and 168 postprandial Patient denies symptoms such as polyuria, polydipsia, excessive hunger, nausea No significant hypoglycemic spells noted. Medications reviewed. Pt reports taking them regularly without complication/adverse reaction being reported today.   Sensitive bowels. Will have Watery BM after he eats - within 10 min. Usually 2 Bms a day. Onset 6 mos or more.  Due colonoscopy.  History Anthony Austin has a past medical history of Hyperlipidemia, Hypertension, and Prostate CA (HCC) (2005).   He has a past surgical history that includes Prostate surgery; Prostatectomy (2005); and Prostatectomy.   His family history includes Cancer in his father; Diabetes in his mother; Hypertension in his father and mother; Stroke in his mother.He reports that he has never smoked. He has never used smokeless tobacco. He reports that he does not drink alcohol and does not use drugs.  Current Outpatient Medications on File Prior to Visit  Medication Sig Dispense Refill   Accu-Chek Softclix Lancets lancets Check BS up to 4 times daily Dx E11.59 400 each 3   atorvastatin (LIPITOR) 40 MG tablet Take 1 tablet (40 mg total) by mouth daily. For cholesterol 90 tablet 3   blood glucose meter kit and supplies KIT Dispense based on patient and insurance preference. Use up to four times daily as directed. (FOR ICD-10 : E11.9 1 each 0   glucose blood (ACCU-CHEK GUIDE) test strip  Check BS up to 4 times daily Dx E11.59 400 strip 3   hydrochlorothiazide (HYDRODIURIL) 25 MG tablet Take 1 tablet (25 mg total) by mouth daily. 90 tablet 3   lisinopril (ZESTRIL) 40 MG tablet Take 1 tablet (40 mg total) by mouth daily. 90 tablet 2   meclizine (ANTIVERT) 25 MG tablet Take 1 tablet (25 mg  total) by mouth 3 (three) times daily as needed for dizziness. 30 tablet 0   No current facility-administered medications on file prior to visit.    ROS Review of Systems  Constitutional:  Negative for fever.  Respiratory:  Negative for shortness of breath.   Cardiovascular:  Negative for chest pain.  Gastrointestinal:  Positive for diarrhea. Negative for abdominal distention, abdominal pain, anal bleeding and blood in stool.  Musculoskeletal:  Negative for arthralgias.  Skin:  Negative for rash.    Objective:  BP 125/82   Pulse 63   Temp 97.8 F (36.6 C)   Wt 201 lb 3.2 oz (91.3 kg)   SpO2 95%   BMI 31.51 kg/m   BP Readings from Last 3 Encounters:  10/28/22 125/82  09/28/22 136/81  07/16/22 120/72    Wt Readings from Last 3 Encounters:  10/28/22 201 lb 3.2 oz (91.3 kg)  09/28/22 199 lb 6.4 oz (90.4 kg)  07/16/22 198 lb 9.6 oz (90.1 kg)     Physical Exam Vitals reviewed.  Constitutional:      Appearance: He is well-developed.  HENT:     Head: Normocephalic and atraumatic.     Right Ear: External ear normal.     Left Ear: External ear normal.     Mouth/Throat:     Pharynx: No oropharyngeal exudate or posterior oropharyngeal erythema.  Eyes:     Pupils: Pupils are equal, round, and reactive to light.  Cardiovascular:     Rate and  Rhythm: Normal rate and regular rhythm.     Heart sounds: No murmur heard. Pulmonary:     Effort: No respiratory distress.     Breath sounds: Normal breath sounds.  Abdominal:     General: There is no distension.     Palpations: There is no mass.     Tenderness: There is no abdominal tenderness.  Musculoskeletal:     Cervical back: Normal range of motion and neck supple.  Neurological:     Mental Status: He is alert and oriented to person, place, and time.       Assessment & Plan:   Anthony Austin was seen today for medical management of chronic issues.  Diagnoses and all orders for this visit:  Type 2 diabetes mellitus with  other circulatory complication, without long-term current use of insulin (HCC) -     Bayer DCA Hb A1c Waived  Essential hypertension -     CBC with Differential/Platelet -     CMP14+EGFR  Hyperlipidemia, unspecified hyperlipidemia type -     Lipid panel  Fecal urgency -     Ambulatory referral to Gastroenterology -     Amylase -     Lipase  Functional diarrhea -     Ambulatory referral to Gastroenterology -     Amylase -     Lipase  Screen for colon cancer -     Ambulatory referral to Gastroenterology  Other orders -     sitaGLIPtin (JANUVIA) 100 MG tablet; Take 1 tablet (100 mg total) by mouth daily. -     Lipase -     Amylase -     Specimen status report      I have discontinued Anthony Austin's metFORMIN and amoxicillin-clavulanate. I am also having him start on sitaGLIPtin. Additionally, I am having him maintain his meclizine, blood glucose meter kit and supplies, Accu-Chek Softclix Lancets, atorvastatin, hydrochlorothiazide, lisinopril, and Accu-Chek Guide.  Meds ordered this encounter  Medications   sitaGLIPtin (JANUVIA) 100 MG tablet    Sig: Take 1 tablet (100 mg total) by mouth daily.    Dispense:  30 tablet    Refill:  2     Follow-up: Return in about 3 months (around 01/28/2023).  Mechele Claude, M.D.

## 2022-10-30 ENCOUNTER — Encounter: Payer: Self-pay | Admitting: Family Medicine

## 2022-10-30 LAB — LIPASE: Lipase: 32 U/L (ref 13–78)

## 2022-10-30 LAB — SPECIMEN STATUS REPORT

## 2022-10-30 LAB — AMYLASE: Amylase: 61 U/L (ref 31–110)

## 2022-11-01 NOTE — Progress Notes (Signed)
Hello Stokes,  Your lab result is normal and/or stable.Some minor variations that are not significant are commonly marked abnormal, but do not represent any medical problem for you.  Best regards, Kristelle Cavallaro, M.D.

## 2022-11-13 ENCOUNTER — Encounter: Payer: Self-pay | Admitting: Internal Medicine

## 2022-12-08 ENCOUNTER — Encounter: Payer: Self-pay | Admitting: Internal Medicine

## 2022-12-08 ENCOUNTER — Ambulatory Visit (AMBULATORY_SURGERY_CENTER): Payer: BC Managed Care – PPO

## 2022-12-08 VITALS — Wt 202.0 lb

## 2022-12-08 DIAGNOSIS — Z1211 Encounter for screening for malignant neoplasm of colon: Secondary | ICD-10-CM

## 2022-12-08 MED ORDER — NA SULFATE-K SULFATE-MG SULF 17.5-3.13-1.6 GM/177ML PO SOLN
1.0000 | Freq: Once | ORAL | 0 refills | Status: AC
Start: 2022-12-08 — End: 2022-12-08

## 2022-12-08 NOTE — Progress Notes (Signed)
No egg or soy allergy known to patient  No issues known to pt with past sedation with any surgeries or procedures Patient denies ever being told they had issues or difficulty with intubation  No FH of Malignant Hyperthermia Pt is not on diet pills Pt is not on  home 02  Pt is not on blood thinners  Pt reports occ constipation  No A fib or A flutter Have any cardiac testing pending--no  LOA: independent   Patient's chart reviewed by Cathlyn Parsons CNRA prior to previsit and patient appropriate for the [facility].  Previsit completed and red dot placed by patient's name on their procedure day (on provider's schedule).     PV completed. Prep reviewed with patient instructions sent via mychart and to home address. Goodrx coupon for [facility] provided. Pt to use Single[employer]com or GoodRx for a price reduction on prep if needed.

## 2023-01-01 ENCOUNTER — Encounter: Payer: Self-pay | Admitting: Internal Medicine

## 2023-01-01 ENCOUNTER — Ambulatory Visit (AMBULATORY_SURGERY_CENTER): Payer: BC Managed Care – PPO | Admitting: Internal Medicine

## 2023-01-01 VITALS — BP 107/73 | HR 62 | Temp 98.0°F | Resp 15 | Ht 67.0 in | Wt 202.0 lb

## 2023-01-01 DIAGNOSIS — Z1211 Encounter for screening for malignant neoplasm of colon: Secondary | ICD-10-CM | POA: Diagnosis not present

## 2023-01-01 MED ORDER — SODIUM CHLORIDE 0.9 % IV SOLN: 500.0000 mL | Freq: Once | INTRAVENOUS | Status: DC

## 2023-01-01 NOTE — Progress Notes (Signed)
HISTORY OF PRESENT ILLNESS:  Anthony Austin is a 62 y.o. male who presents today for screening colonoscopy.  Index exam in 2012 was negative for neoplasia  REVIEW OF SYSTEMS:  All non-GI ROS negative. Past Medical History:  Diagnosis Date   Diabetes mellitus without complication (HCC)    Heat exhaustion    Hyperlipidemia    Hypertension    Prostate CA (HCC) 2005    Past Surgical History:  Procedure Laterality Date   COLONOSCOPY  07/31/2010   Dr. Arlyce Dice   PROSTATE SURGERY     PROSTATECTOMY  2005   PROSTATECTOMY      Social History Shepard L Ast  reports that he has never smoked. He has never used smokeless tobacco. He reports that he does not drink alcohol and does not use drugs.  family history includes Cancer in his father; Diabetes in his mother; Hypertension in his father and mother; Stroke in his mother.  No Known Allergies     PHYSICAL EXAMINATION: Vital signs: BP 114/71   Pulse 74   Temp 98 F (36.7 C)   Ht 5\' 7"  (1.702 m)   Wt 202 lb (91.6 kg)   SpO2 95%   BMI 31.64 kg/m  General: Well-developed, well-nourished, no acute distress HEENT: Sclerae are anicteric, conjunctiva pink. Oral mucosa intact Lungs: Clear Heart: Regular Abdomen: soft, nontender, nondistended, no obvious ascites, no peritoneal signs, normal bowel sounds. No organomegaly. Extremities: No edema Psychiatric: alert and oriented x3. Cooperative     ASSESSMENT:  Colon cancer screening   PLAN:   Screening colonoscopy

## 2023-01-01 NOTE — Patient Instructions (Signed)
YOU HAD AN ENDOSCOPIC PROCEDURE TODAY AT THE [facility]:   Refer to the procedure report that was given to you for any specific questions about what was found during the examination.  If the procedure report does not answer your questions, please call your gastroenterologist to clarify.  If you requested that your care partner not be given the details of your procedure findings, then the procedure report has been included in a sealed envelope for you to review at your convenience later.  YOU SHOULD EXPECT: Some feelings of bloating in the abdomen. Passage of more gas than usual.  Walking can help get rid of the air that was put into your GI tract during the procedure and reduce the bloating. If you had a lower endoscopy (such as a colonoscopy or flexible sigmoidoscopy) you may notice spotting of blood in your stool or on the toilet paper. If you underwent a bowel prep for your procedure, you may not have a normal bowel movement for a few days.  Please Note:  You might notice some irritation and congestion in your nose or some drainage.  This is from the oxygen used during your procedure.  There is no need for concern and it should clear up in a day or so.  SYMPTOMS TO REPORT IMMEDIATELY:  Following lower endoscopy (colonoscopy or flexible sigmoidoscopy):  Excessive amounts of blood in the stool  Significant tenderness or worsening of abdominal pains  Swelling of the abdomen that is new, acute  Fever of 100F or higher  For urgent or emergent issues, a gastroenterologist can be reached at any hour by calling [phone]. Do not use MyChart messaging for urgent concerns.    DIET:  We do recommend a small meal at first, but then you may proceed to your regular diet.  Drink plenty of fluids but you should avoid alcoholic beverages for 24 hours.  ACTIVITY:  You should plan to take it easy for the rest of today and you should NOT DRIVE or use heavy machinery until tomorrow (because of  the sedation medicines used during the test).    FOLLOW UP: Our staff will call the number listed on your records the next business day following your procedure.  We will call around 7:15- 8:00 am to check on you and address any questions or concerns that you may have regarding the information given to you following your procedure. If we do not reach you, we will leave a message.      SIGNATURES/CONFIDENTIALITY: You and/or your care partner have signed paperwork which will be entered into your electronic medical record.  These signatures attest to the fact that that the information above on your After Visit Summary has been reviewed and is understood.  Full responsibility of the confidentiality of this discharge information lies with you and/or your care-partner. 

## 2023-01-01 NOTE — Progress Notes (Signed)
Pt's states no medical or surgical changes since previsit or office visit. 

## 2023-01-01 NOTE — Progress Notes (Signed)
Vss nad trans to pacu 

## 2023-01-01 NOTE — Op Note (Signed)
[facility] Patient Name: Anthony Austin Procedure Date: 01/01/2023 9:04 AM MRN: [id number] Endoscopist: Wilhemina Bonito. Marina Goodell , MD, [id number] Age: 62 Referring MD:  Date of Birth: 12/08/1960 Gender: Male Account #: [id number] Procedure:                Colonoscopy Indications:              Screening for colorectal malignant neoplasm. Index                            exam 2012 was negative for neoplasia Medicines:                Monitored Anesthesia Care Procedure:                Pre-Anesthesia Assessment:                           - Prior to the procedure, a History and Physical                            was performed, and patient medications and                            allergies were reviewed. The patient's tolerance of                            previous anesthesia was also reviewed. The risks                            and benefits of the procedure and the sedation                            options and risks were discussed with the patient.                            All questions were answered, and informed consent                            was obtained. Prior Anticoagulants: The patient has                            taken no anticoagulant or antiplatelet agents.                            After reviewing the risks and benefits, the patient                            was deemed in satisfactory condition to undergo the                            procedure.                           After obtaining informed consent, the colonoscope  was passed under direct vision. Throughout the                            procedure, the patient's blood pressure, pulse, and                            oxygen saturations were monitored continuously. The                            CF [id number] #[id number] was introduced through the anus                            and advanced to the the cecum, identified by                            appendiceal orifice and ileocecal valve. The                             ileocecal valve, appendiceal orifice, and rectum                            were photographed. The quality of the bowel                            preparation was excellent. The colonoscopy was                            performed without difficulty. The patient tolerated                            the procedure well. The bowel preparation used was                            SUPREP via split dose instruction. Scope In: 9:19:04 AM Scope Out: 9:29:58 AM Scope Withdrawal Time: 0 hours 8 minutes 42 seconds  Total Procedure Duration: 0 hours 10 minutes 54 seconds  Findings:                 Multiple diverticula were found in the sigmoid                            colon.                           Internal hemorrhoids were found during                            retroflexion. The hemorrhoids were small.                           The exam was otherwise without abnormality on                            direct and retroflexion views. Complications:            No immediate complications.  Estimated blood loss:                            None. Estimated Blood Loss:     Estimated blood loss: none. Impression:               - Diverticulosis in the sigmoid colon.                           - Internal hemorrhoids.                           - The examination was otherwise normal on direct                            and retroflexion views.                           - No specimens collected. Recommendation:           - Repeat colonoscopy in 10 years for screening                            purposes.                           - Patient has a contact number available for                            emergencies. The signs and symptoms of potential                            delayed complications were discussed with the                            patient. Return to normal activities tomorrow.                            Written discharge instructions were provided to the                             patient.                           - Resume previous diet.                           - Continue present medications. Wilhemina Bonito. Marina Goodell, MD 01/01/2023 9:34:26 AM This report has been signed electronically.

## 2023-01-04 ENCOUNTER — Telehealth: Payer: Self-pay | Admitting: *Deleted

## 2023-01-04 NOTE — Telephone Encounter (Signed)
No answer on  follow up call. Left message.   

## 2023-01-23 DIAGNOSIS — N3001 Acute cystitis with hematuria: Secondary | ICD-10-CM | POA: Diagnosis not present

## 2023-01-23 DIAGNOSIS — R350 Frequency of micturition: Secondary | ICD-10-CM | POA: Diagnosis not present

## 2023-02-01 ENCOUNTER — Ambulatory Visit (INDEPENDENT_AMBULATORY_CARE_PROVIDER_SITE_OTHER): Payer: BC Managed Care – PPO | Admitting: Family Medicine

## 2023-02-01 ENCOUNTER — Encounter: Payer: Self-pay | Admitting: Family Medicine

## 2023-02-01 VITALS — BP 135/76 | HR 61 | Temp 98.0°F | Ht 67.0 in | Wt 201.6 lb

## 2023-02-01 DIAGNOSIS — I10 Essential (primary) hypertension: Secondary | ICD-10-CM

## 2023-02-01 DIAGNOSIS — E1159 Type 2 diabetes mellitus with other circulatory complications: Secondary | ICD-10-CM

## 2023-02-01 DIAGNOSIS — E785 Hyperlipidemia, unspecified: Secondary | ICD-10-CM

## 2023-02-01 DIAGNOSIS — Z7984 Long term (current) use of oral hypoglycemic drugs: Secondary | ICD-10-CM | POA: Diagnosis not present

## 2023-02-01 LAB — CBC WITH DIFFERENTIAL/PLATELET
Basophils Absolute: 0 10*3/uL (ref 0.0–0.2)
Basos: 1 %
EOS (ABSOLUTE): 0.1 10*3/uL (ref 0.0–0.4)
Eos: 2 %
Hematocrit: 46.9 % (ref 37.5–51.0)
Hemoglobin: 15.5 g/dL (ref 13.0–17.7)
Immature Grans (Abs): 0 10*3/uL (ref 0.0–0.1)
Immature Granulocytes: 0 %
Lymphocytes Absolute: 2.4 10*3/uL (ref 0.7–3.1)
Lymphs: 41 %
MCH: 32 pg (ref 26.6–33.0)
MCHC: 33 g/dL (ref 31.5–35.7)
MCV: 97 fL (ref 79–97)
Monocytes Absolute: 0.4 10*3/uL (ref 0.1–0.9)
Monocytes: 8 %
Neutrophils Absolute: 2.8 10*3/uL (ref 1.4–7.0)
Neutrophils: 48 %
Platelets: 223 10*3/uL (ref 150–450)
RBC: 4.85 x10E6/uL (ref 4.14–5.80)
RDW: 12.3 % (ref 11.6–15.4)
WBC: 5.7 10*3/uL (ref 3.4–10.8)

## 2023-02-01 LAB — CMP14+EGFR
ALT: 72 IU/L — ABNORMAL HIGH (ref 0–44)
AST: 50 IU/L — ABNORMAL HIGH (ref 0–40)
Albumin: 4.3 g/dL (ref 3.9–4.9)
Alkaline Phosphatase: 81 IU/L (ref 44–121)
BUN/Creatinine Ratio: 18 (ref 10–24)
BUN: 21 mg/dL (ref 8–27)
Bilirubin Total: 0.7 mg/dL (ref 0.0–1.2)
CO2: 27 mmol/L (ref 20–29)
Calcium: 9.7 mg/dL (ref 8.6–10.2)
Chloride: 100 mmol/L (ref 96–106)
Creatinine, Ser: 1.14 mg/dL (ref 0.76–1.27)
Globulin, Total: 2 g/dL (ref 1.5–4.5)
Glucose: 131 mg/dL — ABNORMAL HIGH (ref 70–99)
Potassium: 4.3 mmol/L (ref 3.5–5.2)
Sodium: 139 mmol/L (ref 134–144)
Total Protein: 6.3 g/dL (ref 6.0–8.5)
eGFR: 73 mL/min/{1.73_m2} (ref >59)

## 2023-02-01 LAB — BAYER DCA HB A1C WAIVED: HB A1C (BAYER DCA - WAIVED): 6.8 % — ABNORMAL HIGH (ref 4.8–5.6)

## 2023-02-01 LAB — LIPID PANEL
Chol/HDL Ratio: 3.7 ratio (ref 0.0–5.0)
Cholesterol, Total: 122 mg/dL (ref 100–199)
HDL: 33 mg/dL — ABNORMAL LOW (ref >39)
LDL Chol Calc (NIH): 63 mg/dL (ref 0–99)
Triglycerides: 147 mg/dL (ref 0–149)
VLDL Cholesterol Cal: 26 mg/dL (ref 5–40)

## 2023-02-01 MED ORDER — SITAGLIPTIN PHOSPHATE 100 MG PO TABS
100.0000 mg | ORAL_TABLET | Freq: Every day | ORAL | 3 refills | Status: DC
Start: 2023-02-01 — End: 2023-06-07

## 2023-02-01 NOTE — Progress Notes (Signed)
Subjective:  Patient ID: Anthony Austin,  male    DOB: 11-26-1960  Age: 62 y.o.    CC: Medical Management of Chronic Issues   HPI Anthony Austin presents for  follow-up of hypertension. Patient has no history of headache chest pain or shortness of breath or recent cough. Patient also denies symptoms of TIA such as numbness weakness lateralizing. Patient denies side effects from medication. States taking it regularly.  Patient also  in for follow-up of elevated cholesterol. Doing well without complaints on current medication. Denies side effects  including myalgia and arthralgia and nausea. Also in today for liver function testing. Currently no chest pain, shortness of breath or other cardiovascular related symptoms noted.  Follow-up of diabetes. Patient does check blood sugar at home. Readings run between 112 and 150 Patient denies symptoms such as excessive hunger or urinary frequency, excessive hunger, nausea No significant hypoglycemic spells noted. Medications reviewed. Pt reports taking them regularly. Pt. denies complication/adverse reaction today.    History Anthony Austin has a past medical history of Diabetes mellitus without complication (HCC), Heat exhaustion, Hyperlipidemia, Hypertension, and Prostate CA (HCC) (2005).   He has a past surgical history that includes Prostate surgery; Prostatectomy (2005); Prostatectomy; and Colonoscopy (07/31/2010).   His family history includes Cancer in his father; Diabetes in his mother; Hypertension in his father and mother; Stroke in his mother.He reports that he has never smoked. He has never used smokeless tobacco. He reports that he does not drink alcohol and does not use drugs.  Current Outpatient Medications on File Prior to Visit  Medication Sig Dispense Refill   atorvastatin (LIPITOR) 40 MG tablet Take 1 tablet (40 mg total) by mouth daily. For cholesterol 90 tablet 3   hydrochlorothiazide (HYDRODIURIL) 25 MG tablet Take 1 tablet (25 mg total)  by mouth daily. 90 tablet 3   lisinopril (ZESTRIL) 40 MG tablet Take 1 tablet (40 mg total) by mouth daily. 90 tablet 2   No current facility-administered medications on file prior to visit.    ROS Review of Systems  Constitutional:  Negative for fever.  Respiratory:  Negative for shortness of breath.   Cardiovascular:  Negative for chest pain.  Musculoskeletal:  Negative for arthralgias.  Skin:  Negative for rash.    Objective:  BP 135/76   Pulse 61   Temp 98 F (36.7 C)   Ht 5\' 7"  (1.702 m)   Wt 201 lb 9.6 oz (91.4 kg)   SpO2 97%   BMI 31.58 kg/m   BP Readings from Last 3 Encounters:  02/01/23 135/76  01/01/23 107/73  10/28/22 125/82    Wt Readings from Last 3 Encounters:  02/01/23 201 lb 9.6 oz (91.4 kg)  01/01/23 202 lb (91.6 kg)  12/08/22 202 lb (91.6 kg)     Physical Exam Vitals reviewed.  Constitutional:      Appearance: He is well-developed.  HENT:     Head: Normocephalic and atraumatic.     Right Ear: External ear normal.     Left Ear: External ear normal.     Mouth/Throat:     Pharynx: No oropharyngeal exudate or posterior oropharyngeal erythema.  Eyes:     Pupils: Pupils are equal, round, and reactive to light.  Cardiovascular:     Rate and Rhythm: Normal rate and regular rhythm.     Heart sounds: No murmur heard. Pulmonary:     Effort: No respiratory distress.     Breath sounds: Normal breath sounds.  Musculoskeletal:  Cervical back: Normal range of motion and neck supple.  Neurological:     Mental Status: He is alert and oriented to person, place, and time.     Diabetic Foot Exam - Simple   No data filed     Lab Results  Component Value Date   HGBA1C 7.1 (H) 10/28/2022   HGBA1C 6.6 (H) 07/16/2022   HGBA1C 6.7 (H) 04/15/2022    Assessment & Plan:   Anthony Austin was seen today for medical management of chronic issues.  Diagnoses and all orders for this visit:  Type 2 diabetes mellitus with other circulatory complication, without  long-term current use of insulin (HCC) -     Bayer DCA Hb A1c Waived -     sitaGLIPtin (JANUVIA) 100 MG tablet; Take 1 tablet (100 mg total) by mouth daily.  Essential hypertension -     CBC with Differential/Platelet -     CMP14+EGFR  Hyperlipidemia, unspecified hyperlipidemia type -     Lipid panel   I have discontinued Anthony Austin's meclizine, blood glucose meter kit and supplies, Accu-Chek Softclix Lancets, and Accu-Chek Guide. I am also having him maintain his atorvastatin, hydrochlorothiazide, lisinopril, and sitaGLIPtin.  Meds ordered this encounter  Medications   sitaGLIPtin (JANUVIA) 100 MG tablet    Sig: Take 1 tablet (100 mg total) by mouth daily.    Dispense:  90 tablet    Refill:  3     Follow-up: Return in about 3 months (around 05/04/2023).  Mechele Claude, M.D.

## 2023-03-05 DIAGNOSIS — R42 Dizziness and giddiness: Secondary | ICD-10-CM | POA: Diagnosis not present

## 2023-03-15 ENCOUNTER — Encounter: Payer: Self-pay | Admitting: Family

## 2023-03-15 ENCOUNTER — Ambulatory Visit: Payer: BC Managed Care – PPO | Admitting: Family

## 2023-03-15 VITALS — BP 137/85 | HR 64 | Temp 97.6°F | Ht 67.0 in | Wt 202.6 lb

## 2023-03-15 DIAGNOSIS — H8110 Benign paroxysmal vertigo, unspecified ear: Secondary | ICD-10-CM

## 2023-03-15 DIAGNOSIS — R42 Dizziness and giddiness: Secondary | ICD-10-CM | POA: Diagnosis not present

## 2023-03-15 MED ORDER — MECLIZINE HCL 25 MG PO TABS: 25.0000 mg | ORAL_TABLET | Freq: Three times a day (TID) | ORAL | 1 refills | Status: DC | PRN

## 2023-03-15 NOTE — Progress Notes (Signed)
Subjective:    Patient ID: Anthony Austin, male    DOB: April 15, 1961, 62 y.o.   MRN: [id number]  Chief Complaint  Patient presents with   Dizziness   Pt presents to the office today with dizziness that started two weeks ago and felt better last week. However, he went to the dentist and after getting his teeth cleaned he left felt dizziness.   He went to the Urgent Care on 03/05/23 and had negative EKG. Dizziness This is a new problem. The current episode started 1 to 4 weeks ago. The problem occurs intermittently. The problem has been waxing and waning. Pertinent negatives include no congestion, coughing, diaphoresis, nausea, sore throat, swollen glands, urinary symptoms or vomiting. The symptoms are aggravated by bending. He has tried rest for the symptoms. The treatment provided mild relief.      Review of Systems  Constitutional:  Negative for diaphoresis.  HENT:  Negative for congestion and sore throat.   Respiratory:  Negative for cough.   Gastrointestinal:  Negative for nausea and vomiting.  Neurological:  Positive for dizziness.  All other systems reviewed and are negative.      Objective:   Physical Exam Vitals reviewed.  Constitutional:      General: He is not in acute distress.    Appearance: He is well-developed.  HENT:     Head: Normocephalic.     Right Ear: Tympanic membrane normal.     Left Ear: Tympanic membrane normal.  Eyes:     General:        Right eye: No discharge.        Left eye: No discharge.     Pupils: Pupils are equal, round, and reactive to light.  Neck:     Thyroid: No thyromegaly.  Cardiovascular:     Rate and Rhythm: Normal rate and regular rhythm.     Heart sounds: Normal heart sounds. No murmur heard. Pulmonary:     Effort: Pulmonary effort is normal. No respiratory distress.     Breath sounds: Normal breath sounds. No wheezing.  Abdominal:     General: Bowel sounds are normal. There is no distension.     Palpations: Abdomen is soft.      Tenderness: There is no abdominal tenderness.  Musculoskeletal:        General: No tenderness. Normal range of motion.     Cervical back: Normal range of motion and neck supple.  Skin:    General: Skin is warm and dry.     Findings: No erythema or rash.  Neurological:     Mental Status: He is alert and oriented to person, place, and time.     Cranial Nerves: No cranial nerve deficit.     Deep Tendon Reflexes: Reflexes are normal and symmetric.  Psychiatric:        Behavior: Behavior normal.        Thought Content: Thought content normal.        Judgment: Judgment normal.       BP 137/85   Pulse 64   Temp 97.6 F (36.4 C) (Temporal)   Ht 5\' 7"  (1.702 m)   Wt 202 lb 9.6 oz (91.9 kg)   SpO2 97%   BMI 31.73 kg/m      Assessment & Plan:  Anthony Austin comes in today with chief complaint of Dizziness   Diagnosis and orders addressed:  1. Dizziness - Anemia Profile B - CMP14+EGFR - TSH - meclizine (ANTIVERT) 25 MG tablet;  Take 1 tablet (25 mg total) by mouth 3 (three) times daily as needed for dizziness.  Dispense: 60 tablet; Refill: 1  2. Benign paroxysmal positional vertigo, unspecified laterality - meclizine (ANTIVERT) 25 MG tablet; Take 1 tablet (25 mg total) by mouth 3 (three) times daily as needed for dizziness.  Dispense: 60 tablet; Refill: 1  3. Lightheaded - Anemia Profile B - CMP14+EGFR - TSH - meclizine (ANTIVERT) 25 MG tablet; Take 1 tablet (25 mg total) by mouth 3 (three) times daily as needed for dizziness.  Dispense: 60 tablet; Refill: 1   Labs pending Antivert given as needed  Fall precautions discussed  Epley exercises discussed and handout given  If labs are negative and symptoms continue will need CT head Follow up in 2 weeks  Jannifer Rodney, FNP

## 2023-03-15 NOTE — Patient Instructions (Signed)
Benign Positional Vertigo Vertigo is the feeling that you or your surroundings are moving when they are not. Benign positional vertigo is the most common form of vertigo. This is usually a harmless condition (benign). This condition is positional. This means that symptoms are triggered by certain movements and positions. This condition can be dangerous if it occurs while you are doing something that could cause harm to yourself or others. This includes activities such as driving or operating machinery. What are the causes? The inner ear has fluid-filled canals that help your brain sense movement and balance. When the fluid moves, the brain receives messages about your body's position. With benign positional vertigo, calcium crystals in the inner ear break free and disturb the inner ear area. This causes your brain to receive confusing messages about your body's position. What increases the risk? You are more likely to develop this condition if: You are a woman. You are 68 years of age or older. You have recently had a head injury. You have an inner ear disease. What are the signs or symptoms? Symptoms of this condition usually happen when you move your head or your eyes in different directions. Symptoms may start suddenly and usually last for less than a minute. They include: Loss of balance and falling. Feeling like you are spinning or moving. Feeling like your surroundings are spinning or moving. Nausea and vomiting. Blurred vision. Dizziness. Involuntary eye movement (nystagmus). Symptoms can be mild and cause only minor problems, or they can be severe and interfere with daily life. Episodes of benign positional vertigo may return (recur) over time. Symptoms may also improve over time. How is this diagnosed? This condition may be diagnosed based on: Your medical history. A physical exam of the head, neck, and ears. Positional tests to check for or stimulate vertigo. You may be asked to  turn your head and change positions, such as going from sitting to lying down. A health care provider will watch for symptoms of vertigo. You may be referred to a health care provider who specializes in ear, nose, and throat problems (ENT or otolaryngologist) or a provider who specializes in disorders of the nervous system (neurologist). How is this treated?  This condition may be treated in a session in which your health care provider moves your head in specific positions to help the displaced crystals in your inner ear move. Treatment for this condition may take several sessions. Surgery may be needed in severe cases, but this is rare. In some cases, benign positional vertigo may resolve on its own in 2-4 weeks. Follow these instructions at home: Safety Move slowly. Avoid sudden body or head movements or certain positions, as told by your health care provider. Avoid driving or operating machinery until your health care provider says it is safe. Avoid doing any tasks that would be dangerous to you or others if vertigo occurs. If you have trouble walking or keeping your balance, try using a cane for stability. If you feel dizzy or unstable, sit down right away. Return to your normal activities as told by your health care provider. Ask your health care provider what activities are safe for you. General instructions Take over-the-counter and prescription medicines only as told by your health care provider. Drink enough fluid to keep your urine pale yellow. Keep all follow-up visits. This is important. Contact a health care provider if: You have a fever. Your condition gets worse or you develop new symptoms. Your family or friends notice any behavioral changes.  You have nausea or vomiting that gets worse. You have numbness or a prickling and tingling sensation. Get help right away if you: Have difficulty speaking or moving. Are always dizzy or faint. Develop severe headaches. Have weakness in  your legs or arms. Have changes in your hearing or vision. Develop a stiff neck. Develop sensitivity to light. These symptoms may represent a serious problem that is an emergency. Do not wait to see if the symptoms will go away. Get medical help right away. Call your local emergency services (911 in the [place] Do not drive yourself to the hospital. Summary Vertigo is the feeling that you or your surroundings are moving when they are not. Benign positional vertigo is the most common form of vertigo. This condition is caused by calcium crystals in the inner ear that become displaced. This causes a disturbance in an area of the inner ear that helps your brain sense movement and balance. Symptoms include loss of balance and falling, feeling that you or your surroundings are moving, nausea and vomiting, and blurred vision. This condition can be diagnosed based on symptoms, a physical exam, and positional tests. Follow safety instructions as told by your health care provider and keep all follow-up visits. This is important. This information is not intended to replace advice given to you by your health care provider. Make sure you discuss any questions you have with your health care provider. Document Revised: 05/08/2020 Document Reviewed: 05/08/2020 Elsevier Patient Education  2024 [employer].

## 2023-03-16 ENCOUNTER — Other Ambulatory Visit: Payer: Self-pay | Admitting: Family

## 2023-03-16 DIAGNOSIS — K760 Fatty (change of) liver, not elsewhere classified: Secondary | ICD-10-CM | POA: Insufficient documentation

## 2023-03-16 DIAGNOSIS — Z7985 Long-term (current) use of injectable non-insulin antidiabetic drugs: Secondary | ICD-10-CM | POA: Insufficient documentation

## 2023-03-16 DIAGNOSIS — E119 Type 2 diabetes mellitus without complications: Secondary | ICD-10-CM | POA: Insufficient documentation

## 2023-03-16 DIAGNOSIS — E1159 Type 2 diabetes mellitus with other circulatory complications: Secondary | ICD-10-CM

## 2023-03-16 LAB — ANEMIA PROFILE B
Basophils Absolute: 0 10*3/uL (ref 0.0–0.2)
Basos: 1 %
EOS (ABSOLUTE): 0.2 10*3/uL (ref 0.0–0.4)
Eos: 3 %
Ferritin: 806 ng/mL — ABNORMAL HIGH (ref 30–400)
Folate: 7.6 ng/mL (ref >3.0)
Hematocrit: 47.2 % (ref 37.5–51.0)
Hemoglobin: 15.4 g/dL (ref 13.0–17.7)
Immature Grans (Abs): 0 10*3/uL (ref 0.0–0.1)
Immature Granulocytes: 0 %
Iron Saturation: 42 % (ref 15–55)
Iron: 116 ug/dL (ref 38–169)
Lymphocytes Absolute: 2.3 10*3/uL (ref 0.7–3.1)
Lymphs: 39 %
MCH: 31.9 pg (ref 26.6–33.0)
MCHC: 32.6 g/dL (ref 31.5–35.7)
MCV: 98 fL — ABNORMAL HIGH (ref 79–97)
Monocytes Absolute: 0.5 10*3/uL (ref 0.1–0.9)
Monocytes: 8 %
Neutrophils Absolute: 2.9 10*3/uL (ref 1.4–7.0)
Neutrophils: 49 %
Platelets: 229 10*3/uL (ref 150–450)
RBC: 4.83 x10E6/uL (ref 4.14–5.80)
RDW: 12.1 % (ref 11.6–15.4)
Retic Ct Pct: 1.9 % (ref 0.6–2.6)
Total Iron Binding Capacity: 275 ug/dL (ref 250–450)
UIBC: 159 ug/dL (ref 111–343)
Vitamin B-12: 408 pg/mL (ref 232–1245)
WBC: 5.9 10*3/uL (ref 3.4–10.8)

## 2023-03-16 LAB — CMP14+EGFR
ALT: 75 IU/L — ABNORMAL HIGH (ref 0–44)
AST: 52 IU/L — ABNORMAL HIGH (ref 0–40)
Albumin: 4.4 g/dL (ref 3.9–4.9)
Alkaline Phosphatase: 82 IU/L (ref 44–121)
BUN/Creatinine Ratio: 16 (ref 10–24)
BUN: 18 mg/dL (ref 8–27)
Bilirubin Total: 0.7 mg/dL (ref 0.0–1.2)
CO2: 25 mmol/L (ref 20–29)
Calcium: 9.3 mg/dL (ref 8.6–10.2)
Chloride: 101 mmol/L (ref 96–106)
Creatinine, Ser: 1.12 mg/dL (ref 0.76–1.27)
Globulin, Total: 2.3 g/dL (ref 1.5–4.5)
Glucose: 160 mg/dL — ABNORMAL HIGH (ref 70–99)
Potassium: 4.2 mmol/L (ref 3.5–5.2)
Sodium: 142 mmol/L (ref 134–144)
Total Protein: 6.7 g/dL (ref 6.0–8.5)
eGFR: 74 mL/min/{1.73_m2} (ref >59)

## 2023-03-16 LAB — TSH: TSH: 1.58 u[IU]/mL (ref 0.450–4.500)

## 2023-03-29 ENCOUNTER — Encounter: Payer: Self-pay | Admitting: Family

## 2023-03-29 ENCOUNTER — Ambulatory Visit (INDEPENDENT_AMBULATORY_CARE_PROVIDER_SITE_OTHER): Payer: BC Managed Care – PPO | Admitting: Family

## 2023-03-29 VITALS — BP 128/79 | HR 57 | Temp 97.7°F | Ht 67.0 in | Wt 203.4 lb

## 2023-03-29 DIAGNOSIS — H8110 Benign paroxysmal vertigo, unspecified ear: Secondary | ICD-10-CM | POA: Diagnosis not present

## 2023-03-29 DIAGNOSIS — K760 Fatty (change of) liver, not elsewhere classified: Secondary | ICD-10-CM | POA: Diagnosis not present

## 2023-03-29 DIAGNOSIS — R42 Dizziness and giddiness: Secondary | ICD-10-CM

## 2023-03-29 MED ORDER — MECLIZINE HCL 25 MG PO TABS: 25.0000 mg | ORAL_TABLET | Freq: Three times a day (TID) | ORAL | 1 refills | Status: DC | PRN

## 2023-03-29 NOTE — Patient Instructions (Signed)
Fatty Liver Disease  The liver converts food into energy, removes toxic material from the blood, makes important proteins, and absorbs necessary vitamins from food. Fatty liver disease occurs when too much fat has built up in your liver cells. Fatty liver disease is also called hepatic steatosis. In many cases, fatty liver disease does not cause symptoms or problems. It is often diagnosed when tests are being done for other reasons. However, over time, fatty liver can cause inflammation that may lead to more serious liver problems, such as scarring of the liver (cirrhosis) and liver failure. Fatty liver is associated with insulin resistance, increased body fat, high blood pressure (hypertension), and high cholesterol. These are features of metabolic syndrome and increase your risk for stroke, diabetes, and heart disease. What are the causes? This condition may be caused by components of metabolic syndrome: Obesity. Insulin resistance. High cholesterol. Other causes: Alcohol abuse. Poor nutrition. Cushing syndrome. Pregnancy. Certain drugs. Poisons. Some viral infections. What increases the risk? You are more likely to develop this condition if you: Abuse alcohol. Are overweight. Have diabetes. Have hepatitis. Have a high triglyceride level. Are pregnant. What are the signs or symptoms? Fatty liver disease often does not cause symptoms. If symptoms do develop, they can include: Fatigue and weakness. Weight loss. Confusion. Nausea, vomiting, or abdominal pain. Yellowing of your skin and the white parts of your eyes (jaundice). Itchy skin. How is this diagnosed? This condition may be diagnosed by: A physical exam and your medical history. Blood tests. Imaging tests, such as an ultrasound, CT scan, or MRI. A liver biopsy. A small sample of liver tissue is removed using a needle. The sample is then looked at under a microscope. How is this treated? Fatty liver disease is often  caused by other health conditions. Treatment for fatty liver may involve medicines and lifestyle changes to manage conditions such as: Alcoholism. High cholesterol. Diabetes. Being overweight or obese. Follow these instructions at home:  Do not drink alcohol. If you have trouble quitting, ask your health care provider how to safely quit with the help of medicine or a supervised program. This is important to keep your condition from getting worse. Eat a healthy diet as told by your health care provider. Ask your health care provider about working with a dietitian to develop an eating plan. Exercise regularly. This can help you lose weight and control your cholesterol and diabetes. Talk to your health care provider about an exercise plan and which activities are best for you. Take over-the-counter and prescription medicines only as told by your health care provider. Keep all follow-up visits. This is important. Contact a health care provider if: You have trouble controlling your: Blood sugar. This is especially important if you have diabetes. Cholesterol. Drinking of alcohol. Get help right away if: You have abdominal pain. You have jaundice. You have nausea and are vomiting. You vomit blood or material that looks like coffee grounds. You have stools that are black, tar-like, or bloody. Summary Fatty liver disease develops when too much fat builds up in the cells of your liver. Fatty liver disease often causes no symptoms or problems. However, over time, fatty liver can cause inflammation that may lead to more serious liver problems, such as scarring of the liver (cirrhosis). You are more likely to develop this condition if you abuse alcohol, are pregnant, are overweight, have diabetes, have hepatitis, or have high triglyceride or cholesterol levels. Contact your health care provider if you have trouble controlling your blood   sugar, cholesterol, or drinking of alcohol. This information is  not intended to replace advice given to you by your health care provider. Make sure you discuss any questions you have with your health care provider. Document Revised: 03/21/2020 Document Reviewed: 03/21/2020 Elsevier Patient Education  2024 [employer]  

## 2023-03-29 NOTE — Progress Notes (Signed)
Subjective:    Patient ID: Anthony Austin, male    DOB: Sep 11, 1960, 62 y.o.   MRN: [id number]  Chief Complaint  Patient presents with   Follow-up   Pt presents to the office today to follow up on dizziness. He was seen on 03/15/23 for dizziness. He was given Epley exercises, Antivert as needed. He reports his dizziness is greatly improved and has not had to take antivert in two days.   He had labs that showed, Anemia Profile (Iron levels, Vitamin B12, Folate, WBC, Hgb, & Plts)- stable Kidney function stable Liver enzymes elevated- Known fatty liver, low fat diet and limit alcohol and tylenol, encourage weight loss Thyroid levels normal Dizziness This is a recurrent problem. The current episode started 1 to 4 weeks ago. The problem occurs intermittently. Pertinent negatives include no fatigue or sore throat. The symptoms are aggravated by bending. He has tried rest for the symptoms. The treatment provided moderate relief.      Review of Systems  Constitutional:  Negative for fatigue.  HENT:  Negative for sore throat.   Neurological:  Positive for dizziness.  All other systems reviewed and are negative.      Objective:   Physical Exam Vitals reviewed.  Constitutional:      General: He is not in acute distress.    Appearance: He is well-developed.  HENT:     Head: Normocephalic.     Right Ear: Tympanic membrane normal.     Left Ear: Tympanic membrane normal.  Eyes:     General:        Right eye: No discharge.        Left eye: No discharge.     Pupils: Pupils are equal, round, and reactive to light.  Neck:     Thyroid: No thyromegaly.  Cardiovascular:     Rate and Rhythm: Normal rate and regular rhythm.     Heart sounds: Normal heart sounds. No murmur heard. Pulmonary:     Effort: Pulmonary effort is normal. No respiratory distress.     Breath sounds: Normal breath sounds. No wheezing.  Abdominal:     General: Bowel sounds are normal. There is no distension.      Palpations: Abdomen is soft.     Tenderness: There is no abdominal tenderness.  Musculoskeletal:        General: No tenderness. Normal range of motion.     Cervical back: Normal range of motion and neck supple.  Skin:    General: Skin is warm and dry.     Findings: No erythema or rash.  Neurological:     Mental Status: He is alert and oriented to person, place, and time.     Cranial Nerves: No cranial nerve deficit.     Deep Tendon Reflexes: Reflexes are normal and symmetric.  Psychiatric:        Behavior: Behavior normal.        Thought Content: Thought content normal.        Judgment: Judgment normal.       BP 128/79   Pulse (!) 57   Temp 97.7 F (36.5 C) (Temporal)   Ht 5\' 7"  (1.702 m)   Wt 203 lb 6.4 oz (92.3 kg)   BMI 31.86 kg/m      Assessment & Plan:  Anthony Austin comes in today with chief complaint of Follow-up   Diagnosis and orders addressed:  1. Benign paroxysmal positional vertigo, unspecified laterality Antivert as needed Epley exercises discussed  Improving  - meclizine (ANTIVERT) 25 MG tablet; Take 1 tablet (25 mg total) by mouth 3 (three) times daily as needed for dizziness.  Dispense: 60 tablet; Refill: 1  2. Fatty liver Limit alcohol  Exercise  Encourage weight loss  3. Dizziness - meclizine (ANTIVERT) 25 MG tablet; Take 1 tablet (25 mg total) by mouth 3 (three) times daily as needed for dizziness.  Dispense: 60 tablet; Refill: 1    Labs reviewed Given symptoms are improving, does not need Ct head Health Maintenance reviewed Diet and exercise encouraged  Follow up plan: Keep follow up with PCP   Jannifer Rodney, FNP

## 2023-05-05 ENCOUNTER — Ambulatory Visit (INDEPENDENT_AMBULATORY_CARE_PROVIDER_SITE_OTHER): Payer: BC Managed Care – PPO | Admitting: Family Medicine

## 2023-05-05 ENCOUNTER — Encounter: Payer: Self-pay | Admitting: Family Medicine

## 2023-05-05 ENCOUNTER — Ambulatory Visit (INDEPENDENT_AMBULATORY_CARE_PROVIDER_SITE_OTHER): Payer: BC Managed Care – PPO

## 2023-05-05 VITALS — BP 127/80 | HR 67 | Temp 97.8°F | Ht 67.0 in | Wt 202.0 lb

## 2023-05-05 DIAGNOSIS — M545 Low back pain, unspecified: Secondary | ICD-10-CM

## 2023-05-05 DIAGNOSIS — G8929 Other chronic pain: Secondary | ICD-10-CM | POA: Diagnosis not present

## 2023-05-05 DIAGNOSIS — I10 Essential (primary) hypertension: Secondary | ICD-10-CM

## 2023-05-05 DIAGNOSIS — E1159 Type 2 diabetes mellitus with other circulatory complications: Secondary | ICD-10-CM

## 2023-05-05 DIAGNOSIS — E785 Hyperlipidemia, unspecified: Secondary | ICD-10-CM

## 2023-05-05 DIAGNOSIS — R42 Dizziness and giddiness: Secondary | ICD-10-CM

## 2023-05-05 DIAGNOSIS — M47816 Spondylosis without myelopathy or radiculopathy, lumbar region: Secondary | ICD-10-CM | POA: Diagnosis not present

## 2023-05-05 LAB — BAYER DCA HB A1C WAIVED: HB A1C (BAYER DCA - WAIVED): 7 % — ABNORMAL HIGH (ref 4.8–5.6)

## 2023-05-05 MED ORDER — ATORVASTATIN CALCIUM 40 MG PO TABS: 40.0000 mg | ORAL_TABLET | Freq: Every day | ORAL | 3 refills | Status: DC

## 2023-05-05 MED ORDER — TIZANIDINE HCL 4 MG PO TABS: 4.0000 mg | ORAL_TABLET | Freq: Four times a day (QID) | ORAL | 1 refills | Status: DC | PRN

## 2023-05-05 MED ORDER — HYDROCHLOROTHIAZIDE 25 MG PO TABS
25.0000 mg | ORAL_TABLET | Freq: Every day | ORAL | 3 refills | Status: DC
Start: 2023-05-05 — End: 2023-11-02

## 2023-05-05 MED ORDER — LISINOPRIL 40 MG PO TABS
40.0000 mg | ORAL_TABLET | Freq: Every day | ORAL | 2 refills | Status: DC
Start: 2023-05-05 — End: 2023-11-29

## 2023-05-05 MED ORDER — OZEMPIC (0.25 OR 0.5 MG/DOSE) 2 MG/3ML ~~LOC~~ SOPN: 0.5000 mg | PEN_INJECTOR | SUBCUTANEOUS | 1 refills | Status: DC

## 2023-05-05 NOTE — Patient Instructions (Signed)

## 2023-05-05 NOTE — Progress Notes (Signed)
Subjective:  Patient ID: Anthony Austin, male    DOB: 02-Jan-1961  Age: 62 y.o. MRN: [id number]  CC: Medical Management of Chronic Issues   HPI Ashtin L Jefferys presents forFollow-up of diabetes. Patient checks blood sugar at home.   120 fasting and 108 postprandial Patient denies symptoms such as polyuria, polydipsia, excessive hunger, nausea No significant hypoglycemic spells noted. Medications reviewed. Pt reports taking them regularly without complication/adverse reaction being reported today.   Vertigo with change of position, leaning baack in chair, recliner, rolling over in bed. Climbing deer stand.   History Tag has a past medical history of Diabetes mellitus without complication (HCC), Heat exhaustion, Hyperlipidemia, Hypertension, and Prostate CA (HCC) (2005).   He has a past surgical history that includes Prostate surgery; Prostatectomy (2005); Prostatectomy; and Colonoscopy (07/31/2010).   His family history includes Cancer in his father; Diabetes in his mother; Hypertension in his father and mother; Stroke in his mother.He reports that he has never smoked. He has never used smokeless tobacco. He reports that he does not drink alcohol and does not use drugs.  Current Outpatient Medications on File Prior to Visit  Medication Sig Dispense Refill   meclizine (ANTIVERT) 25 MG tablet Take 1 tablet (25 mg total) by mouth 3 (three) times daily as needed for dizziness. 60 tablet 1   sitaGLIPtin (JANUVIA) 100 MG tablet Take 1 tablet (100 mg total) by mouth daily. 90 tablet 3   No current facility-administered medications on file prior to visit.    ROS Review of Systems  Constitutional:  Negative for fever.  Respiratory:  Negative for shortness of breath.   Cardiovascular:  Negative for chest pain.  Musculoskeletal:  Negative for arthralgias.  Skin:  Negative for rash.  Neurological:  Positive for dizziness.    Objective:  BP 127/80   Pulse 67   Temp 97.8 F (36.6 C)   Ht  5\' 7"  (1.702 m)   Wt 202 lb (91.6 kg)   SpO2 98%   BMI 31.64 kg/m   BP Readings from Last 3 Encounters:  05/05/23 127/80  03/29/23 128/79  03/15/23 137/85    Wt Readings from Last 3 Encounters:  05/05/23 202 lb (91.6 kg)  03/29/23 203 lb 6.4 oz (92.3 kg)  03/15/23 202 lb 9.6 oz (91.9 kg)     Physical Exam Vitals reviewed.  Constitutional:      Appearance: He is well-developed.  HENT:     Head: Normocephalic and atraumatic.     Right Ear: External ear normal.     Left Ear: External ear normal.     Mouth/Throat:     Pharynx: No oropharyngeal exudate or posterior oropharyngeal erythema.  Eyes:     Pupils: Pupils are equal, round, and reactive to light.  Cardiovascular:     Rate and Rhythm: Normal rate and regular rhythm.     Heart sounds: No murmur heard. Pulmonary:     Effort: No respiratory distress.     Breath sounds: Normal breath sounds.  Musculoskeletal:     Cervical back: Normal range of motion and neck supple.  Neurological:     Mental Status: He is alert and oriented to person, place, and time.       Assessment & Plan:   Granderson was seen today for medical management of chronic issues.  Diagnoses and all orders for this visit:  Type 2 diabetes mellitus with other circulatory complication, without long-term current use of insulin (HCC) -     [facility]  Hb A1c Waived  Essential hypertension -     CBC with Differential/Platelet -     CMP14+EGFR -     hydrochlorothiazide (HYDRODIURIL) 25 MG tablet; Take 1 tablet (25 mg total) by mouth daily. -     lisinopril (ZESTRIL) 40 MG tablet; Take 1 tablet (40 mg total) by mouth daily.  Hyperlipidemia, unspecified hyperlipidemia type -     Lipid panel  Vertigo -     Ambulatory referral to Physical Therapy  Lumbar pain -     DG Lumbar Spine 2-3 Views; Future  Other orders -     atorvastatin (LIPITOR) 40 MG tablet; Take 1 tablet (40 mg total) by mouth daily. For cholesterol -     tiZANidine (ZANAFLEX) 4 MG  tablet; Take 1 tablet (4 mg total) by mouth every 6 (six) hours as needed for muscle spasms. -     Semaglutide,0.25 or 0.5MG /DOS, (OZEMPIC, 0.25 OR 0.5 MG/DOSE,) 2 MG/3ML SOPN; Inject 0.5 mg into the skin once a week.      I am having Surafel L. Keysor start on tiZANidine and Ozempic (0.25 or 0.5 MG/DOSE). I am also having him maintain his sitaGLIPtin, meclizine, atorvastatin, hydrochlorothiazide, and lisinopril.  Meds ordered this encounter  Medications   atorvastatin (LIPITOR) 40 MG tablet    Sig: Take 1 tablet (40 mg total) by mouth daily. For cholesterol    Dispense:  90 tablet    Refill:  3   hydrochlorothiazide (HYDRODIURIL) 25 MG tablet    Sig: Take 1 tablet (25 mg total) by mouth daily.    Dispense:  90 tablet    Refill:  3   lisinopril (ZESTRIL) 40 MG tablet    Sig: Take 1 tablet (40 mg total) by mouth daily.    Dispense:  90 tablet    Refill:  2   tiZANidine (ZANAFLEX) 4 MG tablet    Sig: Take 1 tablet (4 mg total) by mouth every 6 (six) hours as needed for muscle spasms.    Dispense:  30 tablet    Refill:  1   Semaglutide,0.25 or 0.5MG /DOS, (OZEMPIC, 0.25 OR 0.5 MG/DOSE,) 2 MG/3ML SOPN    Sig: Inject 0.5 mg into the skin once a week.    Dispense:  3 mL    Refill:  1     Follow-up: Return in about 1 month (around 06/04/2023).  Mechele Claude, M.D.

## 2023-05-06 LAB — CMP14+EGFR
ALT: 63 IU/L — ABNORMAL HIGH (ref 0–44)
AST: 41 IU/L — ABNORMAL HIGH (ref 0–40)
Albumin: 4.6 g/dL (ref 3.9–4.9)
Alkaline Phosphatase: 83 [IU]/L (ref 44–121)
BUN/Creatinine Ratio: 13 (ref 10–24)
BUN: 17 mg/dL (ref 8–27)
Bilirubin Total: 0.7 mg/dL (ref 0.0–1.2)
CO2: 26 mmol/L (ref 20–29)
Calcium: 10 mg/dL (ref 8.6–10.2)
Chloride: 100 mmol/L (ref 96–106)
Creatinine, Ser: 1.3 mg/dL — ABNORMAL HIGH (ref 0.76–1.27)
Globulin, Total: 2.3 g/dL (ref 1.5–4.5)
Glucose: 138 mg/dL — ABNORMAL HIGH (ref 70–99)
Potassium: 4.5 mmol/L (ref 3.5–5.2)
Sodium: 141 mmol/L (ref 134–144)
Total Protein: 6.9 g/dL (ref 6.0–8.5)
eGFR: 62 mL/min/{1.73_m2} (ref >59)

## 2023-05-06 LAB — CBC WITH DIFFERENTIAL/PLATELET
Basophils Absolute: 0 10*3/uL (ref 0.0–0.2)
Basos: 1 %
EOS (ABSOLUTE): 0.1 10*3/uL (ref 0.0–0.4)
Eos: 2 %
Hematocrit: 47.9 % (ref 37.5–51.0)
Hemoglobin: 15.8 g/dL (ref 13.0–17.7)
Immature Grans (Abs): 0 10*3/uL (ref 0.0–0.1)
Immature Granulocytes: 0 %
Lymphocytes Absolute: 2.5 10*3/uL (ref 0.7–3.1)
Lymphs: 44 %
MCH: 31.9 pg (ref 26.6–33.0)
MCHC: 33 g/dL (ref 31.5–35.7)
MCV: 97 fL (ref 79–97)
Monocytes Absolute: 0.4 10*3/uL (ref 0.1–0.9)
Monocytes: 7 %
Neutrophils Absolute: 2.6 10*3/uL (ref 1.4–7.0)
Neutrophils: 46 %
Platelets: 239 10*3/uL (ref 150–450)
RBC: 4.95 x10E6/uL (ref 4.14–5.80)
RDW: 12.6 % (ref 11.6–15.4)
WBC: 5.6 10*3/uL (ref 3.4–10.8)

## 2023-05-06 LAB — LIPID PANEL
Chol/HDL Ratio: 3.5 ratio (ref 0.0–5.0)
Cholesterol, Total: 108 mg/dL (ref 100–199)
HDL: 31 mg/dL — ABNORMAL LOW (ref >39)
LDL Chol Calc (NIH): 58 mg/dL (ref 0–99)
Triglycerides: 98 mg/dL (ref 0–149)
VLDL Cholesterol Cal: 19 mg/dL (ref 5–40)

## 2023-05-07 NOTE — Progress Notes (Signed)
Hello Maddax,  Your lab result is normal and/or stable.Some minor variations that are not significant are commonly marked abnormal, but do not represent any medical problem for you.  Best regards, Myron Stankovich, M.D.

## 2023-05-18 ENCOUNTER — Ambulatory Visit: Payer: Self-pay | Admitting: Family Medicine

## 2023-05-18 ENCOUNTER — Telehealth: Payer: Self-pay | Admitting: Family Medicine

## 2023-05-18 NOTE — Telephone Encounter (Signed)
Patient needs appointment for re eval and to submit another urine sample, please schedule

## 2023-05-18 NOTE — Telephone Encounter (Signed)
  Chief Complaint: urinary frequency Symptoms: urinary frequency, burning with urination, blood in urine Pertinent Negatives: Patient denies fever, flank pain Disposition: [] ED /[] Urgent Care (no appt availability in office) / [x] Appointment(In office/virtual)/ []  [place] [facility]/ [] Home Care/ [x] Refused Recommended Disposition /[]  Mobile Bus/ []  Follow-up with PCP Additional Notes: Patient called in reporting that he been experiencing urinary symptoms since last night. Patient reports this is similar to the last time he had a UTI. Per protocol, this RN attempted to schedule in office appt. Patient refused and reports he is not able to be seen due to work conflicts. Patient requesting Dr. Darlyn Read to send him a prescription. This RN advised patient that information will be sent over to office but that he may still need to be seen to verify UTI. Patient verbalized understanding.    Copied from CRM [id number]. Topic: Clinical - Pink Word Triage >> May 18, 2023  5:31 PM Alvino Blood C wrote: Reason for Triage: PT believes he has a UTI Reason for Disposition  Urinating more frequently than usual (i.e., frequency)  Answer Assessment - Initial Assessment Questions 1. SYMPTOM: "What's the main symptom you're concerned about?" (e.g., frequency, incontinence)     Frequency, burning with urination 2. ONSET: "When did the  frequency  start?"     Last night 3. PAIN: "Is there any pain?" If Yes, ask: "How bad is it?" (Scale: 1-10; mild, moderate, severe)     moderate 4. CAUSE: "What do you think is causing the symptoms?"     I think I have a UTI 5. OTHER SYMPTOMS: "Do you have any other symptoms?" (e.g., blood in urine, fever, flank pain, pain with urination)     Blood in urine, burning with urination  Protocols used: Urinary Symptoms-A-AH

## 2023-05-18 NOTE — Telephone Encounter (Unsigned)
Copied from CRM [id number]. Topic: Clinical - Medical Advice >> May 18, 2023  9:41 AM Theodis Sato wrote: Reason for CRM: PT is requesting more medication for his UTI. PT states infection has not gone away.

## 2023-05-18 NOTE — Telephone Encounter (Signed)
NA NVM. Apt scheduled for tomorrow

## 2023-05-19 ENCOUNTER — Ambulatory Visit: Payer: BC Managed Care – PPO

## 2023-06-07 ENCOUNTER — Ambulatory Visit (INDEPENDENT_AMBULATORY_CARE_PROVIDER_SITE_OTHER): Payer: BC Managed Care – PPO | Admitting: Family Medicine

## 2023-06-07 ENCOUNTER — Encounter: Payer: Self-pay | Admitting: Family Medicine

## 2023-06-07 VITALS — BP 114/70 | HR 64 | Temp 98.5°F | Ht 67.0 in | Wt 196.0 lb

## 2023-06-07 DIAGNOSIS — Z7985 Long-term (current) use of injectable non-insulin antidiabetic drugs: Secondary | ICD-10-CM

## 2023-06-07 DIAGNOSIS — E119 Type 2 diabetes mellitus without complications: Secondary | ICD-10-CM | POA: Diagnosis not present

## 2023-06-07 MED ORDER — OZEMPIC (0.25 OR 0.5 MG/DOSE) 2 MG/3ML ~~LOC~~ SOPN: 0.5000 mg | PEN_INJECTOR | SUBCUTANEOUS | 1 refills | Status: DC

## 2023-06-07 NOTE — Progress Notes (Signed)
Subjective:  Patient ID: Anthony Austin, male    DOB: [birth year]  Age: 62 y.o. MRN: [id number]  CC: Follow-up   HPI Anthony Austin presents forFollow-up of diabetes. Patient checks blood sugar at home.   95-104 fasting and low 100s postprandial Patient denies symptoms such as polyuria, polydipsia, excessive hunger, nausea No significant hypoglycemic spells noted. Medications reviewed. Pt reports taking them regularly without complication/adverse reaction being reported today.    History Anthony Austin has a past medical history of Diabetes mellitus without complication (HCC), Heat exhaustion, Hyperlipidemia, Hypertension, and Prostate CA (HCC) (2005).   He has a past surgical history that includes Prostate surgery; Prostatectomy (2005); Prostatectomy; and Colonoscopy (07/31/2010).   His family history includes Cancer in his father; Diabetes in his mother; Hypertension in his father and mother; Stroke in his mother.He reports that he has never smoked. He has never used smokeless tobacco. He reports that he does not drink alcohol and does not use drugs.  Current Outpatient Medications on File Prior to Visit  Medication Sig Dispense Refill   atorvastatin (LIPITOR) 40 MG tablet Take 1 tablet (40 mg total) by mouth daily. For cholesterol 90 tablet 3   hydrochlorothiazide (HYDRODIURIL) 25 MG tablet Take 1 tablet (25 mg total) by mouth daily. 90 tablet 3   lisinopril (ZESTRIL) 40 MG tablet Take 1 tablet (40 mg total) by mouth daily. 90 tablet 2   meclizine (ANTIVERT) 25 MG tablet Take 1 tablet (25 mg total) by mouth 3 (three) times daily as needed for dizziness. 60 tablet 1   tiZANidine (ZANAFLEX) 4 MG tablet Take 1 tablet (4 mg total) by mouth every 6 (six) hours as needed for muscle spasms. 30 tablet 1   No current facility-administered medications on file prior to visit.    ROS Review of Systems  Constitutional:  Positive for appetite change (decreased). Negative for activity change.  HENT:  Negative.    Respiratory: Negative.    Gastrointestinal:  Negative for abdominal pain, diarrhea, nausea and vomiting.    Objective:  BP 114/70   Pulse 64   Temp 98.5 F (36.9 C)   Ht 5\' 7"  (1.702 m)   SpO2 97%   BMI 31.64 kg/m   BP Readings from Last 3 Encounters:  06/07/23 114/70  05/05/23 127/80  03/29/23 128/79  `  Wt Readings from Last 3 Encounters:  05/05/23 202 lb (91.6 kg)  03/29/23 203 lb 6.4 oz (92.3 kg)  03/15/23 202 lb 9.6 oz (91.9 kg)     Physical Exam Vitals reviewed.  Constitutional:      Appearance: He is well-developed.  HENT:     Head: Normocephalic and atraumatic.     Right Ear: External ear normal.     Left Ear: External ear normal.     Mouth/Throat:     Pharynx: No oropharyngeal exudate or posterior oropharyngeal erythema.  Eyes:     Pupils: Pupils are equal, round, and reactive to light.  Cardiovascular:     Rate and Rhythm: Normal rate and regular rhythm.     Heart sounds: No murmur heard. Pulmonary:     Effort: No respiratory distress.     Breath sounds: Normal breath sounds.  Musculoskeletal:     Cervical back: Normal range of motion and neck supple.  Neurological:     Mental Status: He is alert and oriented to person, place, and time.       Assessment & Plan:   Anthony Austin was seen today for follow-up.  Diagnoses and  all orders for this visit:  Diabetes mellitus treated with injections of non-insulin medication (HCC)  Other orders -     Semaglutide,0.25 or 0.5MG /DOS, (OZEMPIC, 0.25 OR 0.5 MG/DOSE,) 2 MG/3ML SOPN; Inject 0.5 mg into the skin once a week.      I have discontinued Leron L. Venuto's sitaGLIPtin. I am also having him maintain his meclizine, atorvastatin, hydrochlorothiazide, lisinopril, tiZANidine, and Ozempic (0.25 or 0.5 MG/DOSE).  Meds ordered this encounter  Medications   Semaglutide,0.25 or 0.5MG /DOS, (OZEMPIC, 0.25 OR 0.5 MG/DOSE,) 2 MG/3ML SOPN    Sig: Inject 0.5 mg into the skin once a week.    Dispense:   3 mL    Refill:  1     Follow-up: Return in about 2 months (around 08/08/2023) for diabetes.  Mechele Claude, M.D.

## 2023-06-09 ENCOUNTER — Telehealth: Payer: Self-pay | Admitting: Family Medicine

## 2023-06-13 ENCOUNTER — Other Ambulatory Visit: Payer: Self-pay | Admitting: Family Medicine

## 2023-06-13 DIAGNOSIS — I10 Essential (primary) hypertension: Secondary | ICD-10-CM

## 2023-06-18 ENCOUNTER — Ambulatory Visit (INDEPENDENT_AMBULATORY_CARE_PROVIDER_SITE_OTHER): Payer: BC Managed Care – PPO | Admitting: *Deleted

## 2023-06-18 ENCOUNTER — Ambulatory Visit: Payer: BC Managed Care – PPO

## 2023-06-18 DIAGNOSIS — E1159 Type 2 diabetes mellitus with other circulatory complications: Secondary | ICD-10-CM | POA: Diagnosis not present

## 2023-06-18 NOTE — Progress Notes (Signed)
Anthony Austin arrived 06/18/2023 and has given verbal consent to obtain images and complete their overdue diabetic retinal screening.  The images have been sent to an ophthalmologist or optometrist for review and interpretation.  Results will be sent back to Mechele Claude, MD for review.  Patient has been informed they will be contacted when we receive the results via telephone or MyChart

## 2023-07-29 DIAGNOSIS — M545 Low back pain, unspecified: Secondary | ICD-10-CM | POA: Diagnosis not present

## 2023-07-29 DIAGNOSIS — R300 Dysuria: Secondary | ICD-10-CM | POA: Diagnosis not present

## 2023-07-29 DIAGNOSIS — N3001 Acute cystitis with hematuria: Secondary | ICD-10-CM | POA: Diagnosis not present

## 2023-08-03 ENCOUNTER — Ambulatory Visit: Payer: BC Managed Care – PPO

## 2023-08-09 ENCOUNTER — Other Ambulatory Visit: Payer: Self-pay

## 2023-08-09 ENCOUNTER — Telehealth: Payer: Self-pay | Admitting: Family Medicine

## 2023-08-09 ENCOUNTER — Ambulatory Visit: Payer: BC Managed Care – PPO | Admitting: Family Medicine

## 2023-08-09 DIAGNOSIS — I10 Essential (primary) hypertension: Secondary | ICD-10-CM

## 2023-08-09 DIAGNOSIS — E1159 Type 2 diabetes mellitus with other circulatory complications: Secondary | ICD-10-CM

## 2023-08-09 DIAGNOSIS — Z7985 Long-term (current) use of injectable non-insulin antidiabetic drugs: Secondary | ICD-10-CM

## 2023-08-09 DIAGNOSIS — E785 Hyperlipidemia, unspecified: Secondary | ICD-10-CM

## 2023-08-09 NOTE — Telephone Encounter (Signed)
 Orders pended. LS

## 2023-08-11 ENCOUNTER — Telehealth (INDEPENDENT_AMBULATORY_CARE_PROVIDER_SITE_OTHER): Payer: BC Managed Care – PPO | Admitting: Family Medicine

## 2023-08-11 ENCOUNTER — Encounter: Payer: Self-pay | Admitting: Family Medicine

## 2023-08-11 DIAGNOSIS — Z7985 Long-term (current) use of injectable non-insulin antidiabetic drugs: Secondary | ICD-10-CM

## 2023-08-11 DIAGNOSIS — E119 Type 2 diabetes mellitus without complications: Secondary | ICD-10-CM

## 2023-08-11 DIAGNOSIS — I10 Essential (primary) hypertension: Secondary | ICD-10-CM | POA: Diagnosis not present

## 2023-08-11 DIAGNOSIS — E785 Hyperlipidemia, unspecified: Secondary | ICD-10-CM | POA: Insufficient documentation

## 2023-08-11 NOTE — Progress Notes (Signed)
 Subjective:    Patient ID: Anthony Austin, male    DOB: 04-05-1961, 63 y.o.   MRN: [id number]   HPI: Anthony Austin is a 63 y.o. male presenting for presents forFollow-up of diabetes. Patient checks blood sugar at home.   80  and 120 fasting and postprandial. Eating less. Less "junk" Down 12 lb.  Patient denies symptoms such as polyuria, polydipsia, excessive hunger, nausea No significant hypoglycemic spells noted. Medications reviewed. Pt reports taking them regularly without complication/adverse reaction being reported today.    in for follow-up of elevated cholesterol. Doing well without complaints on current medication. Denies side effects of statin including myalgia and arthralgia and nausea. Currently no chest pain, shortness of breath or other cardiovascular related symptoms noted.    presents for  follow-up of hypertension. Patient has no history of headache chest pain or shortness of breath or recent cough. Patient also denies symptoms of TIA such as focal numbness or weakness. Patient denies side effects from medication. States taking it regularly.  Lab Results  Component Value Date   HGBA1C 7.0 (H) 05/05/2023   HGBA1C 6.8 (H) 02/01/2023   HGBA1C 7.1 (H) 10/28/2022          06/07/2023   10:31 AM 05/05/2023    9:27 AM 02/01/2023    8:23 AM 09/28/2022    2:55 PM 07/16/2022    7:58 AM  Depression screen PHQ 2/9  Decreased Interest 0 0 0 0 0  Down, Depressed, Hopeless 0 0 0 0 0  PHQ - 2 Score 0 0 0 0 0     Relevant past medical, surgical, family and social history reviewed and updated as indicated.  Interim medical history since our last visit reviewed. Allergies and medications reviewed and updated.  ROS:  Review of Systems  Constitutional:  Negative for fever.  Respiratory:  Negative for shortness of breath.   Cardiovascular:  Negative for chest pain.  Genitourinary:  Positive for frequency (recent UTI has now cleared).  Musculoskeletal:  Negative for  arthralgias.  Skin:  Negative for rash.  Neurological:  Negative for headaches.     Social History   Tobacco Use  Smoking Status Never  Smokeless Tobacco Never       Objective:     Wt Readings from Last 3 Encounters:  06/07/23 196 lb (88.9 kg)  05/05/23 202 lb (91.6 kg)  03/29/23 203 lb 6.4 oz (92.3 kg)     Exam deferred. Video visit performed.   Assessment & Plan:   1. Diabetes mellitus treated with injections of non-insulin medication (HCC)   2. Essential hypertension   3. Hyperlipidemia, unspecified hyperlipidemia type     No orders of the defined types were placed in this encounter.   Orders Placed This Encounter  Procedures   CBC with Differential/Platelet   CMP14+EGFR    Has the patient fasted?:   Yes    Release to patient:   Immediate   Bayer DCA Hb A1c Waived   Lipid panel    Has the patient fasted?:   Yes    Release to patient:   Immediate      Diagnoses and all orders for this visit:  Diabetes mellitus treated with injections of non-insulin medication (HCC) -     CBC with Differential/Platelet -     CMP14+EGFR -     Bayer DCA Hb A1c Waived -     Lipid panel  Essential hypertension  Hyperlipidemia, unspecified hyperlipidemia type  Pt will come  by office for blood work once weathre clears  Virtual Visit  Note  I discussed the limitations, risks, security and privacy concerns of performing an evaluation and management service by video and the availability of in person appointments. The patient was identified with two identifiers. Pt.expressed understanding and agreed to proceed. Pt. Is at home. Dr. Darlyn Read is in his office.  Follow Up Instructions:   I discussed the assessment and treatment plan with the patient. The patient was provided an opportunity to ask questions and all were answered. The patient agreed with the plan and demonstrated an understanding of the instructions.   The patient was advised to call back or seek an in-person  evaluation if the symptoms worsen or if the condition fails to improve as anticipated.   Total minutes contact time: 21   Follow up plan: Return in about 3 months (around 11/08/2023).  Mechele Claude, MD [facility]

## 2023-08-24 ENCOUNTER — Ambulatory Visit: Payer: Self-pay | Admitting: Family Medicine

## 2023-08-24 NOTE — Telephone Encounter (Signed)
  Chief Complaint: weakness Symptoms: weakness, lightheadedness Frequency: 3 days ago was first s/s Pertinent Negatives: Patient denies recent illness, SOB, CP, currently feeling dizzy/lightheaded  Disposition: [] ED /[] Urgent Care (no appt availability in office) / [x] Appointment(In office/virtual)/ []  [place] [facility]/ [] Home Care/ [] Refused Recommended Disposition /[] [place] [facility]/ []  Follow-up with PCP  Additional Notes: Pt states that about 3 days ago he started feeling "off". Pt states he took his BP at that time and it was 88/40.  Pt states that he has checked his BP yesterday and today: 100/69, 103/69. Pt states he has been taking hydrochlorothiazide and lisinopril for quite some time and never had issues. Pts BP over the three days have been lower than normal for him, pt held his BP med today and states he is feeling much better. Pt advised to Hold BP med until appt tomorrow or advised otherwise by PCP. Pt states that he has lost about 18 pounds over the last couple months. Pt sched with DOD. Pt advised to go to ED if he feels like he may pass out again. Pt also advised to keep written documentation of BP readings AM/PM until appt.   Copied from CRM [id number]. Topic: Clinical - Red Word Triage >> Aug 24, 2023  4:14 PM Philippa Chester F wrote: [facility] that prompted transfer to Nurse Triage:  Ozempic and hydrochlorothiazide (HYDRODIURIL) 25 MG tablet  Patient states blood pressure has been low.  Dizzy spells   Feels off  Feeling faint when he gets up too fast   Stomach upset   Symptoms started Saturday  103/69   Has gotten as low as : 88/40 Reason for Disposition  Brief (now gone) dizziness or lightheadedness after standing up or eating  Answer Assessment - Initial Assessment Questions 1. BLOOD PRESSURE: "What is the blood pressure?" "Did you take at least two measurements 5 minutes apart?"     100/69, 103/69 2. ONSET: "When did you take your blood pressure?"     3  different times took BP 3. HOW: "How did you obtain the blood pressure?" (e.g., visiting nurse, automatic home BP monitor)     Wrist auto 4. HISTORY: "Do you have a history of low blood pressure?" "What is your blood pressure normally?"     Has been taking lisinopril and hydrochlorothiazide since November at least 5. MEDICINES: "Are you taking any medications for blood pressure?" If Yes, ask: "Have they been changed recently?"     Yes taking them, but has stopped taking them today d/t how it was making me feel 6. PULSE RATE: "Do you know what your pulse rate is?"      60s 7. OTHER SYMPTOMS: "Have you been sick recently?" "Have you had a recent injury?"     denies  Protocols used: Blood Pressure - Low-A-AH

## 2023-08-25 ENCOUNTER — Encounter: Payer: Self-pay | Admitting: Family Medicine

## 2023-08-25 ENCOUNTER — Ambulatory Visit

## 2023-08-25 VITALS — Temp 97.9°F | Ht 67.0 in | Wt 187.4 lb

## 2023-08-25 DIAGNOSIS — E1159 Type 2 diabetes mellitus with other circulatory complications: Secondary | ICD-10-CM | POA: Diagnosis not present

## 2023-08-25 DIAGNOSIS — E1165 Type 2 diabetes mellitus with hyperglycemia: Secondary | ICD-10-CM

## 2023-08-25 DIAGNOSIS — Z7985 Long-term (current) use of injectable non-insulin antidiabetic drugs: Secondary | ICD-10-CM

## 2023-08-25 DIAGNOSIS — I152 Hypertension secondary to endocrine disorders: Secondary | ICD-10-CM

## 2023-08-25 NOTE — Progress Notes (Signed)
   Acute Office Visit  Subjective:     Patient ID: Anthony Austin, male    DOB: 01/14/1961, 63 y.o.   MRN: [id number]  Chief Complaint  Patient presents with   Hypotension    HPI Patient is in today for low blood pressure. His BP at home was 88/47 and 98/50 4 days ago. He increased his fluid intake. He was feeling lightheaded when bending over. He stopped taking lisinopril 40 mg and hydrochlorothiazide 25 mg yesterday. BP was 103/69, 124/80 yesterday. 132/80 this morning. Lightheadedness has now resolved.   ROS As per HPI.      Objective:    Temp 97.9 F (36.6 C) (Temporal)   Ht 5\' 7"  (1.702 m)   Wt 187 lb 6.4 oz (85 kg)   SpO2 97%   BMI 29.35 kg/m  BP Readings from Last 3 Encounters:  06/07/23 114/70  05/05/23 127/80  03/29/23 128/79    08/25/2023    8:07 AM 8:11 AM 8:12 AM    Orthostatic BP 138/88 144/93Orthostatic BP. 144/93. Data is abnormal. Taken on 08/25/23 8:11 AM 144/92Orthostatic BP. 144/92. Data is abnormal. Taken on 08/25/23 8:12 AM  Patient Position Supine Sitting Standing  Orthostatic Pulse 67 66 69  Temp -- -- 97.9 F (36.6 C)  Temp Source -- -- Temporal  Weight 187 lb 6.4 oz (85 kg) -- --  Height 5\' 7"  (1.702 m) -- --  SpO2 -- -- 97 %       Physical Exam Vitals and nursing note reviewed.  Constitutional:      General: He is not in acute distress.    Appearance: He is not ill-appearing, toxic-appearing or diaphoretic.  Cardiovascular:     Rate and Rhythm: Normal rate and regular rhythm.     Heart sounds: Normal heart sounds. No murmur heard. Pulmonary:     Effort: Pulmonary effort is normal. No respiratory distress.     Breath sounds: Normal breath sounds. No wheezing, rhonchi or rales.  Musculoskeletal:     Right lower leg: No edema.     Left lower leg: No edema.  Skin:    General: Skin is warm and dry.  Neurological:     General: No focal deficit present.     Mental Status: He is alert and oriented to person, place, and time.  Psychiatric:         Mood and Affect: Mood normal.        Behavior: Behavior normal.     No results found for any visits on 08/25/23.      Assessment & Plan:   Sorren was seen today for hypotension.  Diagnoses and all orders for this visit:  Hypertension associated with type 2 diabetes mellitus (HCC)  Type 2 diabetes mellitus with hyperglycemia, without long-term current use of insulin (HCC)   BP 138/88 at home this morning without mediation for 2 days. Discussed to restart lisinopril as he has T2DM at 20 mg. Hold hydrochlorothiazide. Monitor BP at home and notify for high or low reading. Discussed hydration to prevent hypotension.    Return if symptoms worsen or fail to improve.  The patient indicates understanding of these issues and agrees with the plan.   Gabriel Earing, FNP

## 2023-08-26 ENCOUNTER — Other Ambulatory Visit: Payer: Self-pay | Admitting: Family Medicine

## 2023-08-30 ENCOUNTER — Other Ambulatory Visit: Payer: BC Managed Care – PPO

## 2023-08-30 DIAGNOSIS — E119 Type 2 diabetes mellitus without complications: Secondary | ICD-10-CM | POA: Diagnosis not present

## 2023-08-30 DIAGNOSIS — Z7985 Long-term (current) use of injectable non-insulin antidiabetic drugs: Secondary | ICD-10-CM | POA: Diagnosis not present

## 2023-08-30 LAB — BAYER DCA HB A1C WAIVED: HB A1C (BAYER DCA - WAIVED): 5.7 % — ABNORMAL HIGH (ref 4.8–5.6)

## 2023-08-30 LAB — LIPID PANEL

## 2023-08-31 ENCOUNTER — Encounter: Payer: Self-pay | Admitting: Family Medicine

## 2023-08-31 LAB — CMP14+EGFR
ALT: 30 IU/L (ref 0–44)
AST: 26 IU/L (ref 0–40)
Albumin: 4.3 g/dL (ref 3.9–4.9)
Alkaline Phosphatase: 75 IU/L (ref 44–121)
BUN/Creatinine Ratio: 11 (ref 10–24)
BUN: 12 mg/dL (ref 8–27)
Bilirubin Total: 0.7 mg/dL (ref 0.0–1.2)
CO2: 22 mmol/L (ref 20–29)
Calcium: 9.4 mg/dL (ref 8.6–10.2)
Chloride: 104 mmol/L (ref 96–106)
Creatinine, Ser: 1.12 mg/dL (ref 0.76–1.27)
Globulin, Total: 1.9 g/dL (ref 1.5–4.5)
Glucose: 102 mg/dL — ABNORMAL HIGH (ref 70–99)
Potassium: 3.7 mmol/L (ref 3.5–5.2)
Sodium: 143 mmol/L (ref 134–144)
Total Protein: 6.2 g/dL (ref 6.0–8.5)
eGFR: 74 mL/min/{1.73_m2} (ref >59)

## 2023-08-31 LAB — CBC WITH DIFFERENTIAL/PLATELET
Basophils Absolute: 0 10*3/uL (ref 0.0–0.2)
Basos: 1 %
EOS (ABSOLUTE): 0.2 10*3/uL (ref 0.0–0.4)
Eos: 3 %
Hematocrit: 45.3 % (ref 37.5–51.0)
Hemoglobin: 15.3 g/dL (ref 13.0–17.7)
Immature Grans (Abs): 0 10*3/uL (ref 0.0–0.1)
Immature Granulocytes: 0 %
Lymphocytes Absolute: 2.3 10*3/uL (ref 0.7–3.1)
Lymphs: 37 %
MCH: 32.7 pg (ref 26.6–33.0)
MCHC: 33.8 g/dL (ref 31.5–35.7)
MCV: 97 fL (ref 79–97)
Monocytes Absolute: 0.4 10*3/uL (ref 0.1–0.9)
Monocytes: 6 %
Neutrophils Absolute: 3.4 10*3/uL (ref 1.4–7.0)
Neutrophils: 53 %
Platelets: 249 10*3/uL (ref 150–450)
RBC: 4.68 x10E6/uL (ref 4.14–5.80)
RDW: 12.9 % (ref 11.6–15.4)
WBC: 6.4 10*3/uL (ref 3.4–10.8)

## 2023-08-31 LAB — LIPID PANEL
Cholesterol, Total: 80 mg/dL — ABNORMAL LOW (ref 100–199)
HDL: 32 mg/dL — ABNORMAL LOW (ref >39)
LDL CALC COMMENT:: 2.5 ratio (ref 0.0–5.0)
LDL Chol Calc (NIH): 31 mg/dL (ref 0–99)
Triglycerides: 79 mg/dL (ref 0–149)
VLDL Cholesterol Cal: 17 mg/dL (ref 5–40)

## 2023-09-02 ENCOUNTER — Ambulatory Visit: Payer: BC Managed Care – PPO | Admitting: Family Medicine

## 2023-10-20 ENCOUNTER — Encounter: Payer: Self-pay | Admitting: Family Medicine

## 2023-10-20 ENCOUNTER — Ambulatory Visit (INDEPENDENT_AMBULATORY_CARE_PROVIDER_SITE_OTHER): Admitting: Family Medicine

## 2023-10-20 VITALS — BP 114/67 | HR 68 | Temp 98.5°F | Ht 67.0 in | Wt 183.0 lb

## 2023-10-20 DIAGNOSIS — E119 Type 2 diabetes mellitus without complications: Secondary | ICD-10-CM | POA: Diagnosis not present

## 2023-10-20 DIAGNOSIS — M5431 Sciatica, right side: Secondary | ICD-10-CM | POA: Diagnosis not present

## 2023-10-20 DIAGNOSIS — Z7985 Long-term (current) use of injectable non-insulin antidiabetic drugs: Secondary | ICD-10-CM

## 2023-10-20 DIAGNOSIS — M5432 Sciatica, left side: Secondary | ICD-10-CM | POA: Diagnosis not present

## 2023-10-20 DIAGNOSIS — L989 Disorder of the skin and subcutaneous tissue, unspecified: Secondary | ICD-10-CM

## 2023-10-20 MED ORDER — FLUOCINONIDE 0.05 % EX CREA: 1.0000 | TOPICAL_CREAM | Freq: Two times a day (BID) | CUTANEOUS | 5 refills | Status: DC

## 2023-10-20 MED ORDER — PREDNISONE 10 MG PO TABS: ORAL_TABLET | ORAL | 0 refills | Status: DC

## 2023-10-20 MED ORDER — TIZANIDINE HCL 4 MG PO TABS
4.0000 mg | ORAL_TABLET | Freq: Four times a day (QID) | ORAL | 1 refills | Status: DC | PRN
Start: 2023-10-20 — End: 2023-11-29

## 2023-10-20 MED ORDER — OZEMPIC (0.25 OR 0.5 MG/DOSE) 2 MG/3ML ~~LOC~~ SOPN
0.5000 mg | PEN_INJECTOR | SUBCUTANEOUS | 3 refills | Status: DC
Start: 2023-10-20 — End: 2023-11-02

## 2023-10-20 NOTE — Progress Notes (Signed)
 Subjective:  Patient ID: Anthony Austin, male    DOB: 1961-03-14  Age: 63 y.o. MRN: [id number]  CC: skin spot (Left arm spot that been there for awhile. Wont heal and not it is tender and dark around the edges. Not draining. Ongoing for months but worse for the past month. ), sciatic pain (Left hip skips butt  and thigh and shots into calf. Hurts when standing or when walking around a lot. Certain positions make worse. On off pain and numbness. ), and Hypertension (Bp drops to 80's over 50's sometimes sine adjusting meds by taking half tab some days. )   HPI Anthony Austin presents for left sided sciatica. Like a knife at times. Hurts laying down or when walking or standing.. Goes to 9-10/10.      06/07/2023   10:31 AM 05/05/2023    9:27 AM 02/01/2023    8:23 AM  Depression screen PHQ 2/9  Decreased Interest 0 0 0  Down, Depressed, Hopeless 0 0 0  PHQ - 2 Score 0 0 0    History Anthony Austin has a past medical history of Diabetes mellitus without complication (HCC), Heat exhaustion, Hyperlipidemia, Hypertension, and Prostate CA (HCC) (2005).   He has a past surgical history that includes Prostate surgery; Prostatectomy (2005); Prostatectomy; and Colonoscopy (07/31/2010).   His family history includes Cancer in his father; Diabetes in his mother; Hypertension in his father and mother; Stroke in his mother.He reports that he has never smoked. He has never used smokeless tobacco. He reports that he does not drink alcohol and does not use drugs.    ROS Review of Systems  Constitutional:  Negative for fever.  Respiratory:  Negative for shortness of breath.   Cardiovascular:  Negative for chest pain.  Musculoskeletal:  Negative for arthralgias.  Skin:  Negative for rash.    Objective:  BP 114/67   Pulse 68   Temp 98.5 F (36.9 C)   Ht 5\' 7"  (1.702 m)   Wt 183 lb (83 kg)   SpO2 95%   BMI 28.66 kg/m   BP Readings from Last 3 Encounters:  10/20/23 114/67  06/07/23 114/70  05/05/23  127/80    Wt Readings from Last 3 Encounters:  10/20/23 183 lb (83 kg)  08/25/23 187 lb 6.4 oz (85 kg)  06/07/23 196 lb (88.9 kg)     Physical Exam Vitals reviewed.  Constitutional:      Appearance: He is well-developed.  HENT:     Head: Normocephalic and atraumatic.     Right Ear: External ear normal.     Left Ear: External ear normal.     Mouth/Throat:     Pharynx: No oropharyngeal exudate or posterior oropharyngeal erythema.  Eyes:     Pupils: Pupils are equal, round, and reactive to light.  Cardiovascular:     Rate and Rhythm: Normal rate and regular rhythm.     Heart sounds: No murmur heard. Pulmonary:     Effort: No respiratory distress.     Breath sounds: Normal breath sounds.  Musculoskeletal:        General: Tenderness (for SLR bilaterally) present.     Cervical back: Normal range of motion and neck supple.  Skin:    General: Skin is warm and dry.     Findings: Lesion (6 mm raised, nonpigmented scaly , warty lesion) present.  Neurological:     Mental Status: He is alert and oriented to person, place, and time.  Assessment & Plan:  Benign skin lesion of forearm -     Fluocinonide; Apply 1 Application topically 2 (two) times daily. To the area of concern on the left forearm  Dispense: 30 g; Refill: 5  Bilateral sciatica -     tiZANidine  HCl; Take 1 tablet (4 mg total) by mouth every 6 (six) hours as needed for muscle spasms.  Dispense: 30 tablet; Refill: 1 -     predniSONE ; Take 5 daily for 3 days followed by 4,3,2 and 1 for 3 days each.  Dispense: 45 tablet; Refill: 0  Diabetes mellitus treated with injections of non-insulin medication (HCC) -     Ozempic  (0.25 or 0.5 MG/DOSE); Inject 0.5 mg into the skin once a week.  Dispense: 9 mL; Refill: 3     Follow-up: Return if symptoms worsen or fail to improve.  Roise Cleaver, M.D.

## 2023-10-26 DIAGNOSIS — R300 Dysuria: Secondary | ICD-10-CM | POA: Diagnosis not present

## 2023-11-01 ENCOUNTER — Ambulatory Visit: Payer: Self-pay

## 2023-11-01 NOTE — Telephone Encounter (Signed)
 Chief Complaint: Hip pain (see notes)  Symptoms: Hip pain that radiates down left leg, causes some numbness Frequency: ongoing since last appt  Pertinent Negatives: Patient denies n/a Disposition: [] ED /[] Urgent Care (no appt availability in office) / [] Appointment(In office/virtual)/ []  [facility]/ [] Home Care/ [] Refused Recommended Disposition /[] [facility]/ [x]  Follow-up with PCP Additional Notes: Patient called and stated he was following up with his hip pain (diagnosed as sciatica in recent office visit notes) - patient has been taking prednisone  and is not having any relief. Based off of patient's description, it sounds more lateral and down the outside of the leg/hip. Patient advised to look up stretches for SI and Sciatic nerve pain, and to try the heating pad. Advised patient I will pass along a message to clinic staff to determine what Dr. Veleta Gerold recommends for further plan of care and pain treatment.    Copied from CRM [id number]. Topic: Clinical - Red Word Triage >> Nov 01, 2023 10:50 AM Antwanette L wrote: Red Word that prompted transfer to Nurse Triage: Patient is having severe pain from left hip down to his leg. Provider prescribed predniSONE  (DELTASONE ) 10 MG tablet but its not working. Reason for Disposition  [1] MODERATE pain (e.g., interferes with normal activities, limping) AND [2] present > 3 days  Answer Assessment - Initial Assessment Questions 1. LOCATION and RADIATION: "Where is the pain located?"      Left hip 2. QUALITY: "What does the pain feel like?"  (e.g., sharp, dull, aching, burning)     Sharp/burning 3. SEVERITY: "How bad is the pain?" "What does it keep you from doing?"   (Scale 1-10; or mild, moderate, severe)   -  MILD (1-3): doesn't interfere with normal activities    -  MODERATE (4-7): interferes with normal activities (e.g., work or school) or awakens from sleep, limping    -  SEVERE (8-10): excruciating pain, unable to do any  normal activities, unable to walk     With moving it goes up to 11 4. ONSET: "When did the pain start?" "Does it come and go, or is it there all the time?"     Ongoing 5. WORK OR EXERCISE: "Has there been any recent work or exercise that involved this part of the body?"      No  6. CAUSE: "What do you think is causing the hip pain?"      Sciatica  7. AGGRAVATING FACTORS: "What makes the hip pain worse?" (e.g., walking, climbing stairs, running)     Moving, walking 8. OTHER SYMPTOMS: "Do you have any other symptoms?" (e.g., back pain, pain shooting down leg,  fever, rash)     Numbs leg on left side  Protocols used: Hip Pain-A-AH

## 2023-11-02 ENCOUNTER — Ambulatory Visit (INDEPENDENT_AMBULATORY_CARE_PROVIDER_SITE_OTHER): Admitting: Family Medicine

## 2023-11-02 ENCOUNTER — Encounter: Payer: Self-pay | Admitting: Family Medicine

## 2023-11-02 VITALS — BP 121/74 | HR 79 | Temp 98.5°F | Ht 67.0 in | Wt 187.0 lb

## 2023-11-02 DIAGNOSIS — I10 Essential (primary) hypertension: Secondary | ICD-10-CM | POA: Diagnosis not present

## 2023-11-02 DIAGNOSIS — E1165 Type 2 diabetes mellitus with hyperglycemia: Secondary | ICD-10-CM | POA: Diagnosis not present

## 2023-11-02 DIAGNOSIS — M5432 Sciatica, left side: Secondary | ICD-10-CM

## 2023-11-02 DIAGNOSIS — I952 Hypotension due to drugs: Secondary | ICD-10-CM | POA: Diagnosis not present

## 2023-11-02 DIAGNOSIS — Z7985 Long-term (current) use of injectable non-insulin antidiabetic drugs: Secondary | ICD-10-CM

## 2023-11-02 MED ORDER — OZEMPIC (1 MG/DOSE) 4 MG/3ML ~~LOC~~ SOPN
1.0000 mg | PEN_INJECTOR | SUBCUTANEOUS | 5 refills | Status: DC
Start: 2023-11-02 — End: 2024-03-08

## 2023-11-02 MED ORDER — HYDROCHLOROTHIAZIDE 25 MG PO TABS: 12.5000 mg | ORAL_TABLET | Freq: Every day | ORAL | Status: DC

## 2023-11-02 MED ORDER — PREDNISONE 20 MG PO TABS: ORAL_TABLET | ORAL | 0 refills | Status: DC

## 2023-11-02 MED ORDER — BETAMETHASONE SOD PHOS & ACET 6 (3-3) MG/ML IJ SUSP: 6.0000 mg | Freq: Once | INTRAMUSCULAR | Status: AC

## 2023-11-02 NOTE — Progress Notes (Signed)
 Subjective:  Patient ID: Anthony Austin, male    DOB: [birth year]  Age: 63 y.o. MRN: [id number]  CC: sciatic pain (Prednisone  was helping some but now its not really helping at all and almost out of meds. /Belt line to hip joint and pt reports that he can trace the pain. Burning and sharp pain and numbness in the lower leg. ) and blood pressure (Pt reports BP is all over the place. Dropped to 80's yesterday and he was very fatigued and took a 3 hour nap. "Sunday which was the next day his BP was 140's but he did not take meds because how long it got the day before. Started after taking lisinopril.  Only taking half when its low nad then whole when high. )   HPI Anthony Austin presents for sciatic pain returned when he got to 3 pred0nisone a day.  Hurts in the area of the left greater trochanter.  There is also numbness at the lateral leg.  There is no weakness.     12" /16/2024   10:31 AM 05/05/2023    9:27 AM 02/01/2023    8:23 AM  Depression screen PHQ 2/9  Decreased Interest 0 0 0  Down, Depressed, Hopeless 0 0 0  PHQ - 2 Score 0 0 0    History Anthony Austin has a past medical history of Diabetes mellitus without complication (HCC), Heat exhaustion, Hyperlipidemia, Hypertension, and Prostate CA (HCC) (2005).   He has a past surgical history that includes Prostate surgery; Prostatectomy (2005); Prostatectomy; and Colonoscopy (07/31/2010).   His family history includes Cancer in his father; Diabetes in his mother; Hypertension in his father and mother; Stroke in his mother.He reports that he has never smoked. He has never used smokeless tobacco. He reports that he does not drink alcohol and does not use drugs.    ROS Review of Systems  Constitutional:  Negative for fever.  Respiratory:  Negative for shortness of breath.   Cardiovascular:  Negative for chest pain.  Musculoskeletal:  Negative for arthralgias.  Skin:  Negative for rash.    Objective:  BP 121/74   Pulse 79   Temp 98.5 F (36.9  C)   Ht 5\' 7"  (1.702 m)   Wt 187 lb (84.8 kg)   SpO2 94%   BMI 29.29 kg/m   BP Readings from Last 3 Encounters:  11/02/23 121/74  10/20/23 114/67  06/07/23 114/70    Wt Readings from Last 3 Encounters:  11/02/23 187 lb (84.8 kg)  10/20/23 183 lb (83 kg)  08/25/23 187 lb 6.4 oz (85 kg)     Physical Exam Vitals reviewed.  Constitutional:      Appearance: He is well-developed.  HENT:     Head: Normocephalic and atraumatic.     Right Ear: External ear normal.     Left Ear: External ear normal.     Mouth/Throat:     Pharynx: No oropharyngeal exudate or posterior oropharyngeal erythema.  Eyes:     Pupils: Pupils are equal, round, and reactive to light.  Cardiovascular:     Rate and Rhythm: Normal rate and regular rhythm.     Heart sounds: No murmur heard. Pulmonary:     Effort: No respiratory distress.     Breath sounds: Normal breath sounds.  Musculoskeletal:        General: Tenderness (Left greater trochanter) present.     Cervical back: Normal range of motion and neck supple.  Neurological:     Mental Status:  He is alert and oriented to person, place, and time.      Assessment & Plan:  Sciatica of left side -     predniSONE ; One twice daily with food for 2 weeks. Then one daily for 2 weeks  Dispense: 42 tablet; Refill: 0 -     Betamethasone  Sod Phos & Acet -     Ambulatory referral to Physical Therapy  Essential hypertension -     hydroCHLOROthiazide ; Take 0.5 tablets (12.5 mg total) by mouth daily.  Type 2 diabetes mellitus with hyperglycemia, without long-term current use of insulin (HCC) -     Betamethasone  Sod Phos & Acet -     Ozempic  (1 MG/DOSE); Inject 1 mg into the skin once a week.  Dispense: 3 mL; Refill: 5  Hypotension due to drugs     Follow-up: Return in about 1 month (around 12/03/2023) for diabetes.  Roise Cleaver, M.D.

## 2023-11-03 ENCOUNTER — Emergency Department (HOSPITAL_BASED_OUTPATIENT_CLINIC_OR_DEPARTMENT_OTHER)
Admission: EM | Admit: 2023-11-03 | Discharge: 2023-11-03 | Disposition: A | Discharge status: Home / Self Care | Attending: Emergency Medicine | Admitting: Emergency Medicine

## 2023-11-03 ENCOUNTER — Ambulatory Visit: Payer: Self-pay

## 2023-11-03 ENCOUNTER — Encounter (HOSPITAL_BASED_OUTPATIENT_CLINIC_OR_DEPARTMENT_OTHER): Payer: Self-pay | Admitting: Emergency Medicine

## 2023-11-03 ENCOUNTER — Emergency Department (HOSPITAL_BASED_OUTPATIENT_CLINIC_OR_DEPARTMENT_OTHER): Admitting: Radiology

## 2023-11-03 ENCOUNTER — Other Ambulatory Visit: Payer: Self-pay

## 2023-11-03 DIAGNOSIS — I700 Atherosclerosis of aorta: Secondary | ICD-10-CM | POA: Diagnosis not present

## 2023-11-03 DIAGNOSIS — I878 Other specified disorders of veins: Secondary | ICD-10-CM | POA: Diagnosis not present

## 2023-11-03 DIAGNOSIS — M5442 Lumbago with sciatica, left side: Secondary | ICD-10-CM | POA: Diagnosis not present

## 2023-11-03 DIAGNOSIS — R030 Elevated blood-pressure reading, without diagnosis of hypertension: Secondary | ICD-10-CM

## 2023-11-03 DIAGNOSIS — D72829 Elevated white blood cell count, unspecified: Secondary | ICD-10-CM | POA: Insufficient documentation

## 2023-11-03 DIAGNOSIS — M25552 Pain in left hip: Secondary | ICD-10-CM | POA: Diagnosis not present

## 2023-11-03 DIAGNOSIS — M1612 Unilateral primary osteoarthritis, left hip: Secondary | ICD-10-CM | POA: Diagnosis not present

## 2023-11-03 DIAGNOSIS — Z79899 Other long term (current) drug therapy: Secondary | ICD-10-CM | POA: Diagnosis not present

## 2023-11-03 DIAGNOSIS — M47816 Spondylosis without myelopathy or radiculopathy, lumbar region: Secondary | ICD-10-CM | POA: Diagnosis not present

## 2023-11-03 DIAGNOSIS — I10 Essential (primary) hypertension: Secondary | ICD-10-CM | POA: Diagnosis not present

## 2023-11-03 DIAGNOSIS — M549 Dorsalgia, unspecified: Secondary | ICD-10-CM | POA: Diagnosis not present

## 2023-11-03 LAB — CBC
HCT: 43.3 % (ref 39.0–52.0)
Hemoglobin: 15.5 g/dL (ref 13.0–17.0)
MCH: 32.7 pg (ref 26.0–34.0)
MCHC: 35.8 g/dL (ref 30.0–36.0)
MCV: 91.4 fL (ref 80.0–100.0)
Platelets: 203 10*3/uL (ref 150–400)
RBC: 4.74 MIL/uL (ref 4.22–5.81)
RDW: 12.2 % (ref 11.5–15.5)
WBC: 13.9 10*3/uL — ABNORMAL HIGH (ref 4.0–10.5)
nRBC: 0 % (ref 0.0–0.2)

## 2023-11-03 LAB — URINALYSIS, ROUTINE W REFLEX MICROSCOPIC
Bacteria, UA: NONE SEEN
Bilirubin Urine: NEGATIVE
Glucose, UA: NEGATIVE mg/dL
Hgb urine dipstick: NEGATIVE
Ketones, ur: NEGATIVE mg/dL
Leukocytes,Ua: NEGATIVE
Nitrite: NEGATIVE
Protein, ur: 30 mg/dL — AB
Specific Gravity, Urine: 1.027 (ref 1.005–1.030)
pH: 6.5 (ref 5.0–8.0)

## 2023-11-03 LAB — BASIC METABOLIC PANEL WITH GFR
Anion gap: 8 (ref 5–15)
BUN: 16 mg/dL (ref 8–23)
CO2: 31 mmol/L (ref 22–32)
Calcium: 9.5 mg/dL (ref 8.9–10.3)
Chloride: 99 mmol/L (ref 98–111)
Creatinine, Ser: 0.99 mg/dL (ref 0.61–1.24)
GFR, Estimated: >60 mL/min (ref >60)
Glucose, Bld: 130 mg/dL — ABNORMAL HIGH (ref 70–99)
Potassium: 3.9 mmol/L (ref 3.5–5.1)
Sodium: 138 mmol/L (ref 135–145)

## 2023-11-03 MED ORDER — IBUPROFEN 400 MG PO TABS
400.0000 mg | ORAL_TABLET | Freq: Once | ORAL | Status: AC
  Administered 2023-11-03: 400 mg via ORAL
  Filled 2023-11-03: qty 1

## 2023-11-03 MED ORDER — ACETAMINOPHEN 500 MG PO TABS
1000.0000 mg | ORAL_TABLET | Freq: Once | ORAL | Status: AC
  Administered 2023-11-03: 1000 mg via ORAL
  Filled 2023-11-03: qty 2

## 2023-11-03 MED ORDER — HYDROMORPHONE HCL 1 MG/ML IJ SOLN
1.0000 mg | Freq: Once | INTRAMUSCULAR | Status: AC
  Administered 2023-11-03: 1 mg via INTRAMUSCULAR
  Filled 2023-11-03: qty 1

## 2023-11-03 MED ORDER — OXYCODONE-ACETAMINOPHEN 5-325 MG PO TABS
1.0000 | ORAL_TABLET | Freq: Four times a day (QID) | ORAL | 0 refills | Status: DC | PRN
Start: 2023-11-03 — End: 2023-11-14

## 2023-11-03 NOTE — Telephone Encounter (Signed)
 Copied from CRM [id number]. Topic: Clinical - Red Word Triage >> Nov 03, 2023  3:12 PM Star [place] wrote: Red Word that prompted transfer to Nurse Triage: left hip and leg pain- beyond level 10   Chief Complaint: Hip Pain, Leg Pain  Symptoms: Pain Radiating Down Leg  Frequency: Chronic  Pertinent Negatives: Patient denies numbness, tingling  Disposition: [x] ED /[] Urgent Care (no appt availability in office) / [] Appointment(In office/virtual)/ []  [facility]/ [] Home Care/ [] Refused Recommended Disposition /[] [facility]/ []  Follow-up with PCP Additional Notes: RW is being triaged for left hip pain and nerve pain. The patient states the pain is so severe he can barely walk. The patient also describes the pain as sharp, stabbing, and running all the way down his left leg. Rates it a 14 on a 0-10 Numeric Pain Scale. Recommended ED due to symptom presentation for prompt evaluation and treatment.  Verbalized understanding, and agreed to disposition.   Reason for Disposition  [1] SEVERE pain (e.g., excruciating, unable to do any normal activities) AND [2] not improved after 2 hours of pain medicine  Answer Assessment - Initial Assessment Questions 1. LOCATION and RADIATION: "Where is the pain located?"      Left Hip Pain, radiates all the way down to the foot  2. QUALITY: "What does the pain feel like?"  (e.g., sharp, dull, aching, burning)     Sharp, Stabbing, Pain,   3. SEVERITY: "How bad is the pain?" "What does it keep you from doing?"   (Scale 1-10; or mild, moderate, severe)   -  MILD (1-3): doesn't interfere with normal activities    -  MODERATE (4-7): interferes with normal activities (e.g., work or school) or awakens from sleep, limping    -  SEVERE (8-10): excruciating pain, unable to do any normal activities, unable to walk     Severe  4. ONSET: "When did the pain start?" "Does it come and go, or is it there all the time?"     Ongoing  5. WORK OR EXERCISE:  "Has there been any recent work or exercise that involved this part of the body?"      No  6. CAUSE: "What do you think is causing the hip pain?"      Unsure  7. AGGRAVATING FACTORS: "What makes the hip pain worse?" (e.g., walking, climbing stairs, running)     Walking  8. OTHER SYMPTOMS: "Do you have any other symptoms?" (e.g., back pain, pain shooting down leg,  fever, rash)     Pain shooting down the leg.  Protocols used: Hip Pain-A-AH

## 2023-11-03 NOTE — Discharge Instructions (Addendum)
 It was our pleasure to provide your ER care today - we hope that you feel better.  Avoid bending at waist or heavy lifting > 20 lbs for the next week.  Try gentle massage and/or heat  therapy to sore area. Try to find position of comfort, such as lying on back with pillow behind knees, or on side with pillow between knees. Continue prednisone /steroid medication.  You may also take percocet as need for pain. No driving for the next 6 hours or when taking percocet. Also, do not take tylenol  or acetaminophen  containing medication when taking percocet.   Contact your doctor in AM tomorrow - see if they are able to arrange outpatient MRI. Also follow up closely with your doctor regarding your blood pressure which is high. Follow up with back specialist, [facility], in one week - call office tomorrow AM to arrange appointment.   Return to ER if worse, new symptoms, fevers, severe/intractable pain, numbness/weakness, loss of bowel/bladder control, or other concern.   You were given pain meds in the ER - no driving for the next 6 hours.

## 2023-11-03 NOTE — ED Triage Notes (Addendum)
 6 weeks sciatic nerve pain- left.  Recently finished course of prednisone . At follow up received shot yesterday and prescription re-filled for a longer course of 4 weeks.  Also being treated for UTI x1.4 week- was previously on an antibiotic- found to be resistant in culture-received cephalexin as replacement-started Monday. Symptoms starting to improve.  Sitting in recliner Saturday had episode of near syncope- 88/47 home bp- normal since.

## 2023-11-03 NOTE — ED Provider Notes (Signed)
 [place] EMERGENCY DEPARTMENT AT [facility] Provider Note   CSN: [id number] Arrival date & time: 11/03/23  1635     History  Chief Complaint  Patient presents with   Back Pain    Anthony Austin is a 63 y.o. male.  Pt with c/o left lower back/buttock area pain radiating down posterior/lateral left hip and leg to lower leg. Occasional numb/tingling sensation to left leg. No saddle area numbness. No problems w urinary retention or incontinence. No problems w normal bowel function. No fever or chills. Indicates pain present for ~ 6 weeks - had spent weekend before digging holes/placing posts, with lots of bending and lifting. One prior episodes radicular leg pain resolved w conservation tx. Has seen pcp - was given initial rx steroid dose pack with transient improvement, and was re-started on steroid med yesterday. Has not taken any pain meds or muscle relaxer as of yet. No prior imaging or back specialty care. No weakness. No problems walking. Although certain movements, positional changes do make worse.   The history is provided by the patient and a relative.  Back Pain Associated symptoms: no abdominal pain, no chest pain, no dysuria, no fever, no headaches and no weakness        Home Medications Prior to Admission medications   Medication Sig Start Date End Date Taking? Authorizing Provider  oxyCODONE-acetaminophen  (PERCOCET/ROXICET) 5-325 MG tablet Take 1-2 tablets by mouth every 6 (six) hours as needed for severe pain (pain score 7-10). 11/03/23  Yes Guadalupe Lee, MD  atorvastatin  (LIPITOR) 40 MG tablet Take 1 tablet (40 mg total) by mouth daily. For cholesterol 05/05/23   Roise Cleaver, MD  cephALEXin (KEFLEX) 500 MG capsule Take 500 mg by mouth. 10/30/23 11/06/23  [provider]  fluocinonide  cream (LIDEX ) 0.05 % Apply 1 Application topically 2 (two) times daily. To the area of concern on the left forearm 10/20/23   Roise Cleaver, MD  hydrochlorothiazide   (HYDRODIURIL ) 25 MG tablet Take 0.5 tablets (12.5 mg total) by mouth daily. 11/02/23   Roise Cleaver, MD  lisinopril  (ZESTRIL ) 40 MG tablet Take 1 tablet (40 mg total) by mouth daily. Patient taking differently: Take 40 mg by mouth daily. One tablet or half depending on BP 05/05/23   Roise Cleaver, MD  meclizine  (ANTIVERT ) 25 MG tablet Take 1 tablet (25 mg total) by mouth 3 (three) times daily as needed for dizziness. 03/29/23   Yevette Hem, FNP  predniSONE  (DELTASONE ) 20 MG tablet One twice daily with food for 2 weeks. Then one daily for 2 weeks 11/02/23   Roise Cleaver, MD  Semaglutide , 1 MG/DOSE, (OZEMPIC , 1 MG/DOSE,) 4 MG/3ML SOPN Inject 1 mg into the skin once a week. 11/02/23   Roise Cleaver, MD  tiZANidine  (ZANAFLEX ) 4 MG tablet Take 1 tablet (4 mg total) by mouth every 6 (six) hours as needed for muscle spasms. 10/20/23   Roise Cleaver, MD      Allergies    Patient has no known allergies.    Review of Systems   Review of Systems  Constitutional:  Negative for chills, diaphoresis and fever.  Respiratory:  Negative for shortness of breath.   Cardiovascular:  Negative for chest pain.  Gastrointestinal:  Negative for abdominal pain, nausea and vomiting.  Genitourinary:  Negative for dysuria and hematuria.  Musculoskeletal:  Positive for back pain. Negative for neck pain.  Skin:  Negative for rash.  Neurological:  Negative for weakness and headaches.    Physical Exam Updated Vital Signs  BP (!) 150/97   Pulse 62   Temp 98.5 F (36.9 C)   Resp 18   SpO2 97%  Physical Exam Vitals and nursing note reviewed.  Constitutional:      Appearance: Normal appearance. He is well-developed.  HENT:     Head: Atraumatic.     Nose: Nose normal.     Mouth/Throat:     Mouth: Mucous membranes are moist.  Eyes:     General: No scleral icterus.    Conjunctiva/sclera: Conjunctivae normal.  Neck:     Trachea: No tracheal deviation.  Cardiovascular:     Rate and Rhythm: Normal rate.      Pulses: Normal pulses.  Pulmonary:     Effort: Pulmonary effort is normal. No accessory muscle usage or respiratory distress.  Abdominal:     General: There is no distension.     Palpations: Abdomen is soft. There is no mass.     Tenderness: There is no abdominal tenderness.  Genitourinary:    Comments: No cva tenderness. Musculoskeletal:        General: No swelling.     Cervical back: Neck supple.     Comments: T/L/S spine non tender, aligned. Left sciatic notch area tenderness. No sts or skin lesions or rash to area of pain. Good passive rom left hip and knee without pain. No LLE swelling. LLE is of normal color and warmth. Intact distal pulses.   Skin:    General: Skin is warm and dry.     Findings: No rash.  Neurological:     Mental Status: He is alert.     Comments: Alert, speech clear. Motor/sens grossly intact bil. No saddle area numbness, sensation grossly intact. LLE motor/sens grossly intact, stre 5/5.   Psychiatric:        Mood and Affect: Mood normal.     ED Results / Procedures / Treatments   Labs (all labs ordered are listed, but only abnormal results are displayed) Results for orders placed or performed during the hospital encounter of 11/03/23  CBC   Collection Time: 11/03/23  8:08 PM  Result Value Ref Range   WBC 13.9 (H) 4.0 - 10.5 K/uL   RBC 4.74 4.22 - 5.81 MIL/uL   Hemoglobin 15.5 13.0 - 17.0 g/dL   HCT 16.1 09.6 - 04.5 %   MCV 91.4 80.0 - 100.0 fL   MCH 32.7 26.0 - 34.0 pg   MCHC 35.8 30.0 - 36.0 g/dL   RDW 40.9 81.1 - 91.4 %   Platelets 203 150 - 400 K/uL   nRBC 0.0 0.0 - 0.2 %  Basic metabolic panel with GFR   Collection Time: 11/03/23  8:08 PM  Result Value Ref Range   Sodium 138 135 - 145 mmol/L   Potassium 3.9 3.5 - 5.1 mmol/L   Chloride 99 98 - 111 mmol/L   CO2 31 22 - 32 mmol/L   Glucose, Bld 130 (H) 70 - 99 mg/dL   BUN 16 8 - 23 mg/dL   Creatinine, Ser 7.82 0.61 - 1.24 mg/dL   Calcium  9.5 8.9 - 10.3 mg/dL   GFR, Estimated >95 >62  mL/min   Anion gap 8 5 - 15  Urinalysis, Routine w reflex microscopic -Urine, Clean Catch   Collection Time: 11/03/23  8:31 PM  Result Value Ref Range   Color, Urine YELLOW YELLOW   APPearance CLEAR CLEAR   Specific Gravity, Urine 1.027 1.005 - 1.030   pH 6.5 5.0 - 8.0   Glucose,  UA NEGATIVE NEGATIVE mg/dL   Hgb urine dipstick NEGATIVE NEGATIVE   Bilirubin Urine NEGATIVE NEGATIVE   Ketones, ur NEGATIVE NEGATIVE mg/dL   Protein, ur 30 (A) NEGATIVE mg/dL   Nitrite NEGATIVE NEGATIVE   Leukocytes,Ua NEGATIVE NEGATIVE   RBC / HPF 0-5 0 - 5 RBC/hpf   WBC, UA 0-5 0 - 5 WBC/hpf   Bacteria, UA NONE SEEN NONE SEEN   Squamous Epithelial / HPF 0-5 0 - 5 /HPF   Mucus PRESENT    DG HIP UNILAT W OR W/O PELVIS 2-3 VIEWS LEFT Result Date: 11/03/2023 CLINICAL DATA:  Hip pain EXAM: DG HIP (WITH OR WITHOUT PELVIS) 2-3V LEFT COMPARISON:  None Available. FINDINGS: SI joints are patent. Phleboliths in the pelvis. Numerous clips in the pelvis. Pubic symphysis and rami appear intact. No fracture or malalignment. Mild hip degenerative changes IMPRESSION: Mild hip degenerative changes. Electronically Signed   By: Esmeralda Hedge M.D.   On: 11/03/2023 18:28   DG Lumbar Spine Complete Result Date: 11/03/2023 CLINICAL DATA:  Back pain EXAM: LUMBAR SPINE - COMPLETE 4+ VIEW COMPARISON:  05/05/2023 FINDINGS: Lumbar alignment is normal. Vertebral body heights are normal. Patent disc spaces. Lower lumbar facet degenerative changes. Aortic atherosclerosis. IMPRESSION: Mild lower lumbar degenerative changes Electronically Signed   By: Esmeralda Hedge M.D.   On: 11/03/2023 18:27     EKG None  Radiology DG HIP UNILAT W OR W/O PELVIS 2-3 VIEWS LEFT Result Date: 11/03/2023 CLINICAL DATA:  Hip pain EXAM: DG HIP (WITH OR WITHOUT PELVIS) 2-3V LEFT COMPARISON:  None Available. FINDINGS: SI joints are patent. Phleboliths in the pelvis. Numerous clips in the pelvis. Pubic symphysis and rami appear intact. No fracture or  malalignment. Mild hip degenerative changes IMPRESSION: Mild hip degenerative changes. Electronically Signed   By: Esmeralda Hedge M.D.   On: 11/03/2023 18:28   DG Lumbar Spine Complete Result Date: 11/03/2023 CLINICAL DATA:  Back pain EXAM: LUMBAR SPINE - COMPLETE 4+ VIEW COMPARISON:  05/05/2023 FINDINGS: Lumbar alignment is normal. Vertebral body heights are normal. Patent disc spaces. Lower lumbar facet degenerative changes. Aortic atherosclerosis. IMPRESSION: Mild lower lumbar degenerative changes Electronically Signed   By: Esmeralda Hedge M.D.   On: 11/03/2023 18:27    Procedures Procedures    Medications Ordered in ED Medications  HYDROmorphone (DILAUDID) injection 1 mg (1 mg Intramuscular Given 11/03/23 1823)  acetaminophen  (TYLENOL ) tablet 1,000 mg (1,000 mg Oral Given 11/03/23 1822)  ibuprofen  (ADVIL ) tablet 400 mg (400 mg Oral Given 11/03/23 1822)    ED Course/ Medical Decision Making/ A&P                                 Medical Decision Making Problems Addressed: Acute left-sided low back pain with left-sided sciatica: acute illness or injury with systemic symptoms    Details: Acute/subacute Elevated blood pressure reading: acute illness or injury Essential hypertension: chronic illness or injury with exacerbation, progression, or side effects of treatment that poses a threat to life or bodily functions  Amount and/or Complexity of Data Reviewed Independent Historian:     Details: Family/friend, hx External Data Reviewed: notes. Labs: ordered. Decision-making details documented in ED Course. Radiology: ordered and independent interpretation performed. Decision-making details documented in ED Course.  Risk OTC drugs. Prescription drug management. Parenteral controlled substances.   On steroid therapy, recently renewed. No pain meds today. Dilaudid IM, acetaminophen  po, ibuprofen  po.   Reviewed nursing notes and prior  charts for additional history.   Imaging ordered.    Labs reviewed/interpreted by me - wbc 13, hgb 15, chem normal. Ua neg for uti, 0 wbc.   Xrays reviewed/interpreted by me - degen changes, no fx.   Discussed w pt, given symptoms ongoing x 6 weeks, rec back specialty follow up and possible MR imaging as outpatient.   Pt currently appears stable for ED d/c. Pt comfortable appearing.   Return precautions provided.           Final Clinical Impression(s) / ED Diagnoses Final diagnoses:  Acute left-sided low back pain with left-sided sciatica  Elevated blood pressure reading  Essential hypertension    Rx / DC Orders ED Discharge Orders          Ordered    oxyCODONE-acetaminophen  (PERCOCET/ROXICET) 5-325 MG tablet  Every 6 hours PRN        11/03/23 2118              Guadalupe Lee, MD 11/03/23 2120

## 2023-11-05 ENCOUNTER — Ambulatory Visit: Payer: Self-pay

## 2023-11-05 NOTE — Telephone Encounter (Signed)
 Noted.

## 2023-11-05 NOTE — Telephone Encounter (Signed)
  Chief Complaint: hip pain Symptoms: hip  and back pain Frequency: 6 weeks Pertinent Negatives: Patient denies sob Disposition: [x] ED /[] Urgent Care (no appt availability in office) / [] Appointment(In office/virtual)/ []  [facility]/ [] Home Care/ [] Refused Recommended Disposition /[] [facility]/ []  Follow-up with PCP Additional Notes: pt states that he was seen Tuesday for hip pain and Wednesday the pain is more severe and he could not walk so he went to the ED. States he got his pain medication on yesterday and was able to get some relief and rest. States that he didn't sleep any last night  due to the pain. States this morning he took more pain medication and his pain is easing up some and now he feels the pain in his lower spine and he states that he can barely get around. States the ED told him to get a MRI scheduled. Pt states pain is so severe even 2 hours after taking 2 pain pills he is still at a 9-10/10 and can't walk around his home.   Copied from CRM [id number]. Topic: Clinical - Red Word Triage >> Nov 05, 2023  8:09 AM Zipporah Him wrote: Red Word that prompted transfer to Nurse Triage:   Patient is having severe pain from left hip down to his leg. Seems to have only been getting worse. Pain levels  have increased. States he is in pain constantly and is not sleeping Reason for Disposition  Patient sounds very sick or weak to the triager  Answer Assessment - Initial Assessment Questions 1. LOCATION and RADIATION: "Where is the pain located?"      Left hip to leg  3. SEVERITY: "How bad is the pain?" "What does it keep you from doing?"   (Scale 1-10; or mild, moderate, severe)   -  MILD (1-3): doesn't interfere with normal activities    -  MODERATE (4-7): interferes with normal activities (e.g., work or school) or awakens from sleep, limping    -  SEVERE (8-10): excruciating pain, unable to do any normal activities, unable to walk     severe 4. ONSET: "When did the  pain start?" "Does it come and go, or is it there all the time?"     6 weeks and comes and goes 5. WORK OR EXERCISE: "Has there been any recent work or exercise that involved this part of the body?"      no      7. AGGRAVATING FACTORS: "What makes the hip pain worse?" (e.g., walking, climbing stairs, running)     walking 8. OTHER SYMPTOMS: "Do you have any other symptoms?" (e.g., back pain, pain shooting down leg,  fever, rash)     Shooting down leg  Protocols used: Hip Pain-A-AH

## 2023-11-08 ENCOUNTER — Other Ambulatory Visit: Payer: Self-pay | Admitting: Family Medicine

## 2023-11-08 ENCOUNTER — Telehealth: Payer: Self-pay

## 2023-11-08 DIAGNOSIS — M5416 Radiculopathy, lumbar region: Secondary | ICD-10-CM

## 2023-11-08 NOTE — Telephone Encounter (Signed)
 Okay to do work note, please. FMLA can be done based on reecent visit.

## 2023-11-08 NOTE — Telephone Encounter (Signed)
MRI ordered stat

## 2023-11-08 NOTE — Telephone Encounter (Signed)
 Copied from CRM [id number]. Topic: Clinical - Medical Advice >> Nov 08, 2023  8:12 AM Bearl Botts A wrote: Reason for CRM: Patient states that he came in to see his PCP on last Tuesday regarding his sciatic nerve and was given a shot for it. Since then he has been back and forth to the emergency room and the pain has quadrupled. Patient states that the ER doctor stated that his PCP need to put in a referral for a MRI. Patient states that he was told that he has to come back to the office to get the MRI referral. Patient states that he is in so much pain that it kills him to walk to the bathroom. Patient is wanting to see if he could please get the MRI referral without having to make another trip to the office. Patient states that he has cried several times due to the pain. Patient is also needing a note for work since he is out of work due to the pain.  Called [facility] spoke with Belva Boyden, explained to patient, per Belva Boyden send message to clinical team.

## 2023-11-08 NOTE — Telephone Encounter (Addendum)
 Pt aware of MRI order but states he has been out of work since 11/03/2023 and would like a note for work to be out until he is able to figure out what is going on. He is trying to file for FMLA at work and have them sent here. Advised pt he may need to be seen in office for the [facility] advise on appt and work note.

## 2023-11-09 ENCOUNTER — Other Ambulatory Visit: Payer: Self-pay

## 2023-11-09 NOTE — Telephone Encounter (Signed)
 LMTCB If pt calls back please relay that work note sent to him via Mychart and we are agreeable to complete FMLA but have not received any paperwork yet.

## 2023-11-11 ENCOUNTER — Telehealth: Payer: Self-pay | Admitting: Family Medicine

## 2023-11-11 NOTE — Telephone Encounter (Signed)
 Sent you an inbasket concerning Peer to Peer for Patient :)

## 2023-11-11 NOTE — Telephone Encounter (Unsigned)
 Copied from CRM [id number]. Topic: General - Billing Inquiry >> Nov 11, 2023  9:47 AM Stanly Early wrote: Reason for CRM: Patient insurance denied his order for MRI,  Insurance told the patient it could be the billing code. I provided the billing number for the patient.

## 2023-11-12 ENCOUNTER — Ambulatory Visit (HOSPITAL_BASED_OUTPATIENT_CLINIC_OR_DEPARTMENT_OTHER)

## 2023-11-12 NOTE — Telephone Encounter (Signed)
 Patient says the MRI isnt for kidney stones. He says its for Sciatic nerve pain, which is what he was seen for on 11/02/23.   Pt says he ended up going to the ED the next day because his pain was so bad and was advised by the ED doctor to contact PCP office and request an outpatient MRI to be done for this.

## 2023-11-14 ENCOUNTER — Emergency Department (HOSPITAL_COMMUNITY)

## 2023-11-14 ENCOUNTER — Encounter (HOSPITAL_COMMUNITY): Payer: Self-pay | Admitting: *Deleted

## 2023-11-14 ENCOUNTER — Emergency Department (HOSPITAL_COMMUNITY)
Admission: EM | Admit: 2023-11-14 | Discharge: 2023-11-14 | Disposition: A | Discharge status: Home / Self Care | Attending: Emergency Medicine | Admitting: Emergency Medicine

## 2023-11-14 ENCOUNTER — Other Ambulatory Visit: Payer: Self-pay

## 2023-11-14 DIAGNOSIS — M5442 Lumbago with sciatica, left side: Secondary | ICD-10-CM | POA: Diagnosis not present

## 2023-11-14 DIAGNOSIS — M5440 Lumbago with sciatica, unspecified side: Secondary | ICD-10-CM

## 2023-11-14 DIAGNOSIS — M5136 Other intervertebral disc degeneration, lumbar region with discogenic back pain only: Secondary | ICD-10-CM | POA: Diagnosis not present

## 2023-11-14 DIAGNOSIS — K5730 Diverticulosis of large intestine without perforation or abscess without bleeding: Secondary | ICD-10-CM | POA: Diagnosis not present

## 2023-11-14 DIAGNOSIS — M47816 Spondylosis without myelopathy or radiculopathy, lumbar region: Secondary | ICD-10-CM | POA: Diagnosis not present

## 2023-11-14 DIAGNOSIS — M5126 Other intervertebral disc displacement, lumbar region: Secondary | ICD-10-CM | POA: Diagnosis not present

## 2023-11-14 DIAGNOSIS — M545 Low back pain, unspecified: Secondary | ICD-10-CM | POA: Diagnosis not present

## 2023-11-14 LAB — CBC WITH DIFFERENTIAL/PLATELET
Abs Immature Granulocytes: 0.05 10*3/uL (ref 0.00–0.07)
Basophils Absolute: 0.1 10*3/uL (ref 0.0–0.1)
Basophils Relative: 0 %
Eosinophils Absolute: 0.6 10*3/uL — ABNORMAL HIGH (ref 0.0–0.5)
Eosinophils Relative: 5 %
HCT: 53 % — ABNORMAL HIGH (ref 39.0–52.0)
Hemoglobin: 19.5 g/dL — ABNORMAL HIGH (ref 13.0–17.0)
Immature Granulocytes: 0 %
Lymphocytes Relative: 17 %
Lymphs Abs: 2.1 10*3/uL (ref 0.7–4.0)
MCH: 33.4 pg (ref 26.0–34.0)
MCHC: 36.8 g/dL — ABNORMAL HIGH (ref 30.0–36.0)
MCV: 90.9 fL (ref 80.0–100.0)
Monocytes Absolute: 0.8 10*3/uL (ref 0.1–1.0)
Monocytes Relative: 6 %
Neutro Abs: 8.8 10*3/uL — ABNORMAL HIGH (ref 1.7–7.7)
Neutrophils Relative %: 72 %
Platelets: 233 10*3/uL (ref 150–400)
RBC: 5.83 MIL/uL — ABNORMAL HIGH (ref 4.22–5.81)
RDW: 12.3 % (ref 11.5–15.5)
WBC: 12.3 10*3/uL — ABNORMAL HIGH (ref 4.0–10.5)
nRBC: 0 % (ref 0.0–0.2)

## 2023-11-14 LAB — I-STAT CHEM 8, ED
BUN: 21 mg/dL (ref 8–23)
Calcium, Ion: 1.08 mmol/L — ABNORMAL LOW (ref 1.15–1.40)
Chloride: 97 mmol/L — ABNORMAL LOW (ref 98–111)
Creatinine, Ser: 1 mg/dL (ref 0.61–1.24)
Glucose, Bld: 159 mg/dL — ABNORMAL HIGH (ref 70–99)
HCT: 54 % — ABNORMAL HIGH (ref 39.0–52.0)
Hemoglobin: 18.4 g/dL — ABNORMAL HIGH (ref 13.0–17.0)
Potassium: 3 mmol/L — ABNORMAL LOW (ref 3.5–5.1)
Sodium: 135 mmol/L (ref 135–145)
TCO2: 23 mmol/L (ref 22–32)

## 2023-11-14 MED ORDER — HYDROMORPHONE HCL 1 MG/ML IJ SOLN
1.0000 mg | Freq: Once | INTRAMUSCULAR | Status: AC
  Administered 2023-11-14: 1 mg via INTRAVENOUS
  Filled 2023-11-14: qty 1

## 2023-11-14 MED ORDER — OXYCODONE-ACETAMINOPHEN 5-325 MG PO TABS: 1.0000 | ORAL_TABLET | Freq: Four times a day (QID) | ORAL | 0 refills | Status: DC | PRN

## 2023-11-14 NOTE — ED Notes (Signed)
 Patient transported to MRI

## 2023-11-14 NOTE — ED Triage Notes (Signed)
 Pt with sciatica pain , has been on steroids, recent steroid injection to left , not long after pain radiates down the left leg. Not able to ambulate. Dr Veleta Gerold ordered MRI and was canceled due to insurance approval. Waiting for MRI reschedule. Not able to sleep due to pain.

## 2023-11-14 NOTE — Telephone Encounter (Signed)
 In that case he will need PT for 6 weeks before the MRI can be approved. I can order with his approval. Also, was his pain improved by the kidney stone procedure?

## 2023-11-14 NOTE — ED Provider Notes (Signed)
 [place] EMERGENCY DEPARTMENT AT [facility] Provider Note   CSN: [id number] Arrival date & time: 11/14/23  1438     History {Add pertinent medical, surgical, social history, OB history to HPI:1} Chief Complaint  Patient presents with   Back Pain    Anthony Austin is a 63 y.o. male.  Patient complains of low back pain radiating down his left leg.  He has been on prednisone  and a muscle relaxer.  His doctor was trying to get an MRI but was unsuccessful.   Back Pain      Home Medications Prior to Admission medications   Medication Sig Start Date End Date Taking? Authorizing Provider  oxyCODONE -acetaminophen  (PERCOCET/ROXICET) 5-325 MG tablet Take 1 tablet by mouth every 6 (six) hours as needed for severe pain (pain score 7-10). 11/14/23  Yes Cheyenne Cotta, MD  atorvastatin  (LIPITOR) 40 MG tablet Take 1 tablet (40 mg total) by mouth daily. For cholesterol 05/05/23   Roise Cleaver, MD  fluocinonide  cream (LIDEX ) 0.05 % Apply 1 Application topically 2 (two) times daily. To the area of concern on the left forearm 10/20/23   Roise Cleaver, MD  hydrochlorothiazide  (HYDRODIURIL ) 25 MG tablet Take 0.5 tablets (12.5 mg total) by mouth daily. 11/02/23   Roise Cleaver, MD  lisinopril  (ZESTRIL ) 40 MG tablet Take 1 tablet (40 mg total) by mouth daily. Patient taking differently: Take 40 mg by mouth daily. One tablet or half depending on BP 05/05/23   Roise Cleaver, MD  meclizine  (ANTIVERT ) 25 MG tablet Take 1 tablet (25 mg total) by mouth 3 (three) times daily as needed for dizziness. 03/29/23   Yevette Hem, FNP  predniSONE  (DELTASONE ) 20 MG tablet One twice daily with food for 2 weeks. Then one daily for 2 weeks 11/02/23   Roise Cleaver, MD  Semaglutide , 1 MG/DOSE, (OZEMPIC , 1 MG/DOSE,) 4 MG/3ML SOPN Inject 1 mg into the skin once a week. 11/02/23   Roise Cleaver, MD  tiZANidine  (ZANAFLEX ) 4 MG tablet Take 1 tablet (4 mg total) by mouth every 6 (six) hours as needed for muscle  spasms. 10/20/23   Roise Cleaver, MD      Allergies    Patient has no known allergies.    Review of Systems   Review of Systems  Musculoskeletal:  Positive for back pain.    Physical Exam Updated Vital Signs BP 115/88   Pulse 95   Temp 98.2 F (36.8 C) (Oral)   Resp 18   Ht 5\' 7"  (1.702 m)   Wt 84.8 kg   SpO2 94%   BMI 29.28 kg/m  Physical Exam  ED Results / Procedures / Treatments   Labs (all labs ordered are listed, but only abnormal results are displayed) Labs Reviewed  CBC WITH DIFFERENTIAL/PLATELET - Abnormal; Notable for the following components:      Result Value   WBC 12.3 (*)    RBC 5.83 (*)    Hemoglobin 19.5 (*)    HCT 53.0 (*)    MCHC 36.8 (*)    Neutro Abs 8.8 (*)    Eosinophils Absolute 0.6 (*)    All other components within normal limits  I-STAT CHEM 8, ED - Abnormal; Notable for the following components:   Potassium 3.0 (*)    Chloride 97 (*)    Glucose, Bld 159 (*)    Calcium , Ion 1.08 (*)    Hemoglobin 18.4 (*)    HCT 54.0 (*)    All other components within normal limits  EKG None  Radiology MR LUMBAR SPINE WO CONTRAST Result Date: 11/14/2023 CLINICAL DATA:  Low back pain radiating down the left leg. Recent steroid therapy. EXAM: MRI LUMBAR SPINE WITHOUT CONTRAST TECHNIQUE: Multiplanar, multisequence MR imaging of the lumbar spine was performed. No intravenous contrast was administered. COMPARISON:  Radiographs 11/03/2023 FINDINGS: Segmentation: The lowest lumbar type non-rib-bearing vertebra is labeled as L5. Alignment:  No vertebral subluxation is observed. Vertebrae: Mild disc desiccation at L3-4 and L4-5 to a lesser extent at L5-S1 although the left side of the L5-S1 disc is of normal to accentuated T2 signal. Given that this slightly accentuated T2 signal is not diffuse throughout the nucleus pulposis and not accompanied by substantial worrisome endplate findings, I favor this as being incidental and not characteristic of discitis. No  significant vertebral marrow edema is identified. Conus medullaris and cauda equina: Conus extends to the upper L2 level. Conus and cauda equina appear normal. Paraspinal and other soft tissues: Scattered descending and sigmoid colon diverticula. Disc levels: T12-L1: Unremarkable L1-2: Unremarkable L2-3: No impingement.  Mild disc bulge. L3-4: No impingement. Mild disc bulge. Mild degenerative facet arthropathy. L4-5: Moderate right and borderline left foraminal stenosis along with mild right subarticular lateral recess stenosis due to disc bulge, right foraminal disc protrusion, facet arthropathy, and mild intervertebral spurring. L5-S1: Prominent left foraminal stenosis due to a large left foraminal and lateral extraforaminal disc protrusion which may be acute/subacute given the mildly accentuated T2 signal in the disc and protrusion. IMPRESSION: 1. Likely subacute left foraminal disc protrusion at L5-S1 causing prominent impingement on the left L5 nerve roots. 2. Moderate right eccentric impingement at L4-5 due to spondylosis and degenerative disc disease. 3. Scattered descending and sigmoid colon diverticula. Electronically Signed   By: Freida Jes M.D.   On: 11/14/2023 16:05    Procedures Procedures  {Document cardiac monitor, telemetry assessment procedure when appropriate:1}  Medications Ordered in ED Medications  HYDROmorphone  (DILAUDID ) injection 1 mg (1 mg Intravenous Given 11/14/23 1545)    ED Course/ Medical Decision Making/ A&P   {   Click here for ABCD2, HEART and other calculatorsREFRESH Note before signing :1}                              Medical Decision Making Amount and/or Complexity of Data Reviewed Labs: ordered. Radiology: ordered.  Risk Prescription drug management.   Patient with this protrusion at L5-S1 causing significant pain.  Patient improved with a shot of Dilaudid .  He will be sent home with Percocets and will continue the steroids and muscle relaxer.   Patient will follow-up with neurosurgery in a couple weeks  {Document critical care time when appropriate:1} {Document review of labs and clinical decision tools ie heart score, Chads2Vasc2 etc:1}  {Document your independent review of radiology images, and any outside records:1} {Document your discussion with family members, caretakers, and with consultants:1} {Document social determinants of health affecting pt's care:1} {Document your decision making why or why not admission, treatments were needed:1} Final Clinical Impression(s) / ED Diagnoses Final diagnoses:  Acute left-sided low back pain with sciatica, sciatica laterality unspecified    Rx / DC Orders ED Discharge Orders          Ordered    oxyCODONE -acetaminophen  (PERCOCET/ROXICET) 5-325 MG tablet  Every 6 hours PRN        11/14/23 1638

## 2023-11-14 NOTE — Discharge Instructions (Signed)
 Follow-up with the neurosurgeon as planned.  Continue your prednisone  and your muscle relaxer

## 2023-11-16 ENCOUNTER — Other Ambulatory Visit: Payer: Self-pay | Admitting: Family Medicine

## 2023-11-16 DIAGNOSIS — M5416 Radiculopathy, lumbar region: Secondary | ICD-10-CM | POA: Insufficient documentation

## 2023-11-16 NOTE — Telephone Encounter (Signed)
 I called and spoke with patient to make him aware of Dr Veleta Gerold notes, and patient said that he went back to the ER this past Sunday and had an MRI at [facility]. See Results.

## 2023-11-16 NOTE — Telephone Encounter (Signed)
 I contacted the patient by phone.  He tells me he went to the emergency room and had an MRI.  I had reviewed the MRI that shows a significant impingement on the left L5 nerve root.  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  I spoke to the patient he is in pain he describes as 11 or more/10 on the pain scale and the only way he can even sit up is by using Percocet that was prescribed in the emergency department.  The MRI had been declined because he had not gone through physical therapy when it was ordered by me in this office.  They would not consider appeal due to the lack of having gone through physical therapy.  Patient was in far too much pain.  He went to the emergency room twice.  They were able to get the MRI done as noted above.  He now has an appoint with neurosurgery but that is 9 days away and again his pain is intolerable.  At this time we will try to expedite his neurosurgery evaluation.  Will renew his oxycodone  as needed until disposition by neurosurgery takes place.  He will just need to be seen in the office if that is desired.

## 2023-11-16 NOTE — Telephone Encounter (Signed)
 Pt states that he never had kidney stones and is not sure where Dr. Veleta Gerold is getting that information.  PT has been ordered for pt. He will call the [facility] office to get scheduled. Pt states that he does have an appt next week with [facility]. Pt is currently taking Percocet for pain and states that it is the only thing that helps. He has been made aware that he will need an appt to discuss refills. Pt believes he has enough to make it through his appt with [facility]

## 2023-11-19 ENCOUNTER — Ambulatory Visit (INDEPENDENT_AMBULATORY_CARE_PROVIDER_SITE_OTHER): Admitting: Nurse Practitioner

## 2023-11-19 ENCOUNTER — Encounter: Payer: Self-pay | Admitting: Nurse Practitioner

## 2023-11-19 VITALS — BP 106/78 | HR 110 | Temp 97.8°F | Ht 67.0 in | Wt 174.0 lb

## 2023-11-19 DIAGNOSIS — M5416 Radiculopathy, lumbar region: Secondary | ICD-10-CM

## 2023-11-19 MED ORDER — OXYCODONE-ACETAMINOPHEN 5-325 MG PO TABS: 1.0000 | ORAL_TABLET | Freq: Four times a day (QID) | ORAL | 0 refills | Status: DC | PRN

## 2023-11-19 NOTE — Patient Instructions (Signed)
 Fall Prevention in the Home, Adult Falls can cause injuries and can happen to people of all ages. There are many things you can do to make your home safer and to help prevent falls. What actions can I take to prevent falls? General information Use good lighting in all rooms. Make sure to: Replace any light bulbs that burn out. Turn on the lights in dark areas and use night-lights. Keep items that you use often in easy-to-reach places. Lower the shelves around your home if needed. Move furniture so that there are clear paths around it. Do not use throw rugs or other things on the floor that can make you trip. If any of your floors are uneven, fix them. Add color or contrast paint or tape to clearly mark and help you see: Grab bars or handrails. First and last steps of staircases. Where the edge of each step is. If you use a ladder or stepladder: Make sure that it is fully opened. Do not climb a closed ladder. Make sure the sides of the ladder are locked in place. Have someone hold the ladder while you use it. Know where your pets are as you move through your home. What can I do in the bathroom?     Keep the floor dry. Clean up any water on the floor right away. Remove soap buildup in the bathtub or shower. Buildup makes bathtubs and showers slippery. Use non-skid mats or decals on the floor of the bathtub or shower. Attach bath mats securely with double-sided, non-slip rug tape. If you need to sit down in the shower, use a non-slip stool. Install grab bars by the toilet and in the bathtub and shower. Do not use towel bars as grab bars. What can I do in the bedroom? Make sure that you have a light by your bed that is easy to reach. Do not use any sheets or blankets on your bed that hang to the floor. Have a firm chair or bench with side arms that you can use for support when you get dressed. What can I do in the kitchen? Clean up any spills right away. If you need to reach something  above you, use a step stool with a grab bar. Keep electrical cords out of the way. Do not use floor polish or wax that makes floors slippery. What can I do with my stairs? Do not leave anything on the stairs. Make sure that you have a light switch at the top and the bottom of the stairs. Make sure that there are handrails on both sides of the stairs. Fix handrails that are broken or loose. Install non-slip stair treads on all your stairs if they do not have carpet. Avoid having throw rugs at the top or bottom of the stairs. Choose a carpet that does not hide the edge of the steps on the stairs. Make sure that the carpet is firmly attached to the stairs. Fix carpet that is loose or worn. What can I do on the outside of my home? Use bright outdoor lighting. Fix the edges of walkways and driveways and fix any cracks. Clear paths of anything that can make you trip, such as tools or rocks. Add color or contrast paint or tape to clearly mark and help you see anything that might make you trip as you walk through a door, such as a raised step or threshold. Trim any bushes or trees on paths to your home. Check to see if handrails are loose  or broken and that both sides of all steps have handrails. Install guardrails along the edges of any raised decks and porches. Have leaves, snow, or ice cleared regularly. Use sand, salt, or ice melter on paths if you live where there is ice and snow during the winter. Clean up any spills in your garage right away. This includes grease or oil spills. What other actions can I take? Review your medicines with your doctor. Some medicines can cause dizziness or changes in blood pressure, which increase your risk of falling. Wear shoes that: Have a low heel. Do not wear high heels. Have rubber bottoms and are closed at the toe. Feel good on your feet and fit well. Use tools that help you move around if needed. These include: Canes. Walkers. Scooters. Crutches. Ask  your doctor what else you can do to help prevent falls. This may include seeing a physical therapist to learn to do exercises to move better and get stronger. Where to find more information Centers for Disease Control and Prevention, STEADI: TonerPromos.no [employer] on Aging: BaseRingTones.pl National Institute on Aging: BaseRingTones.pl Contact a doctor if: You are afraid of falling at home. You feel weak, drowsy, or dizzy at home. You fall at home. Get help right away if you: Lose consciousness or have trouble moving after a fall. Have a fall that causes a head injury. These symptoms may be an emergency. Get help right away. Call 911. Do not wait to see if the symptoms will go away. Do not drive yourself to the hospital. This information is not intended to replace advice given to you by your health care provider. Make sure you discuss any questions you have with your health care provider. Document Revised: 02/09/2022 Document Reviewed: 02/09/2022 Elsevier Patient Education  2024 [employer].

## 2023-11-19 NOTE — Progress Notes (Signed)
   Subjective:    Patient ID: Anthony Austin, male    DOB: Feb 20, 1961, 63 y.o.   MRN: [id number]  Chief Complaint: Back Pain   Back Pain    Patient has had back pain for several months. Back pain got worse so he came in to see DR. Stacks 11/02/23. An MRI was ordered incorrectlyso was denied. He ended up going to the ED where [facility] was done. Showed lumbar radiculopathy. He has appointment with neurosurgeon June 5,2025. Needs pain meds refilled until he can see neuro surgeon. Rates pain 10/10 currently. Seating increases pain. He had one percocet and he took it this morning. Patient Active Problem List   Diagnosis Date Noted   Lumbar radiculopathy, acute 11/16/2023   Hyperlipidemia 08/11/2023   Diabetes mellitus treated with injections of non-insulin medication (HCC) 03/16/2023   Fatty liver 03/16/2023   BMI 29.0-29.9,adult 02/13/2019   Vitamin D  deficiency 02/13/2019   Paresthesia of both feet 02/13/2019   Claudication (HCC) 09/02/2016   Essential hypertension 09/20/2015       Review of Systems  Musculoskeletal:  Positive for back pain.       Objective:   Physical Exam Cardiovascular:     Rate and Rhythm: Normal rate and regular rhythm.     Heart sounds: Normal heart sounds.  Pulmonary:     Breath sounds: Normal breath sounds.  Musculoskeletal:     Comments: Gait slow and steady with walker (+) SLR on left Weakness left lower ext.   Skin:    General: Skin is warm.  Neurological:     General: No focal deficit present.     Mental Status: He is alert and oriented to person, place, and time.  Psychiatric:        Mood and Affect: Mood normal.        Behavior: Behavior normal.     BP 106/78   Pulse (!) 110   Temp 97.8 F (36.6 C) (Temporal)   Ht 5\' 7"  (1.702 m)   Wt 174 lb (78.9 kg)   SpO2 95%   BMI 27.25 kg/m        Assessment & Plan:   Anthony Austin in today with chief complaint of Back Pain   1. Lumbar radiculopathy, acute (Primary) Moist  heat Rest Keep appointment with neurosirgeon  Meds ordered this encounter  Medications   oxyCODONE -acetaminophen  (PERCOCET/ROXICET) 5-325 MG tablet    Sig: Take 1 tablet by mouth every 6 (six) hours as needed for severe pain (pain score 7-10).    Dispense:  20 tablet    Refill:  0    Supervising Provider:   Jolyne Needs A [[id number]]       The above assessment and management plan was discussed with the patient. The patient verbalized understanding of and has agreed to the management plan. Patient is aware to call the clinic if symptoms persist or worsen. Patient is aware when to return to the clinic for a follow-up visit. Patient educated on when it is appropriate to go to the emergency department.   Mary-Margaret Gaylyn Keas, FNP

## 2023-11-22 IMAGING — CT CT ABD-PELV W/ CM
2 of 5 series · 16 of 46 positions shown, 18 images · IV contrast (Omnipaque or Isovue)
Comparison: CT pelvis 08/13/2005.

CLINICAL DATA: Acute abdominal pain.  Evaluate for appendicitis.

EXAM:
CT ABDOMEN AND PELVIS WITH CONTRAST
TECHNIQUE: Multidetector CT imaging of the abdomen and pelvis was performed
using the standard protocol following bolus administration of
intravenous contrast.

[Series 2: axial st · axial · 0.86mm/px · z∈[+884,+1359]mm · 13 of 109 slices shown, 15 images]
[im 7/109  soft-tissue]
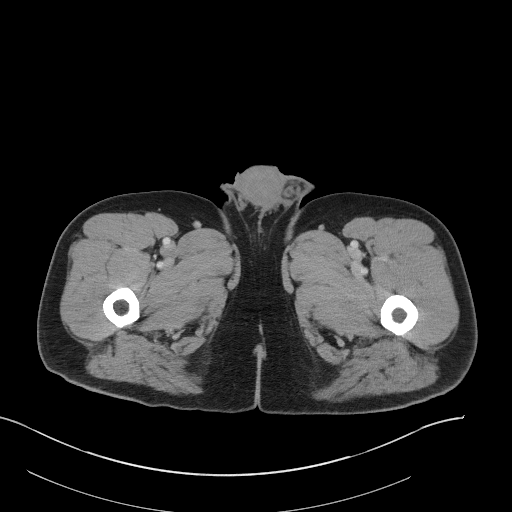
[im 7/109  bone]
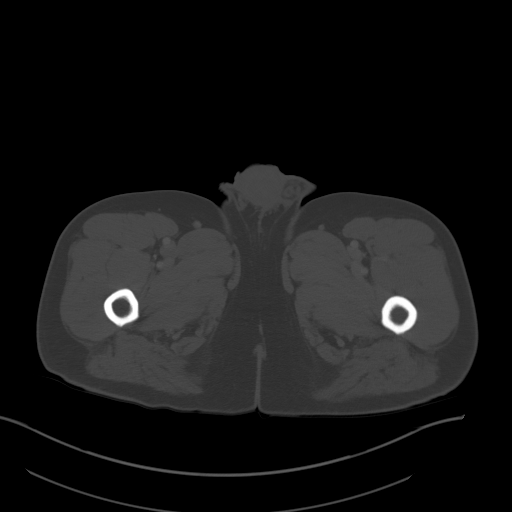
[im 14/109  soft-tissue]
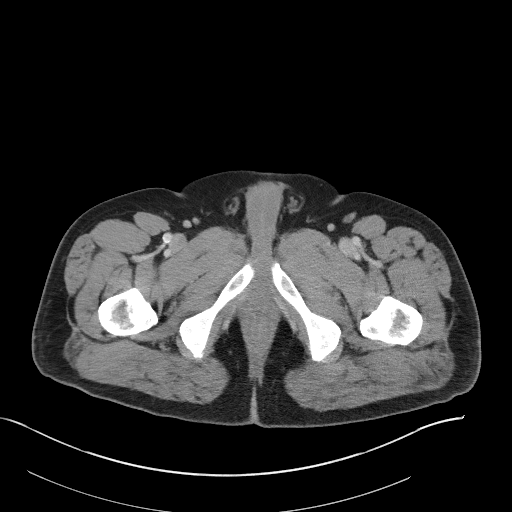
[im 21/109  soft-tissue]
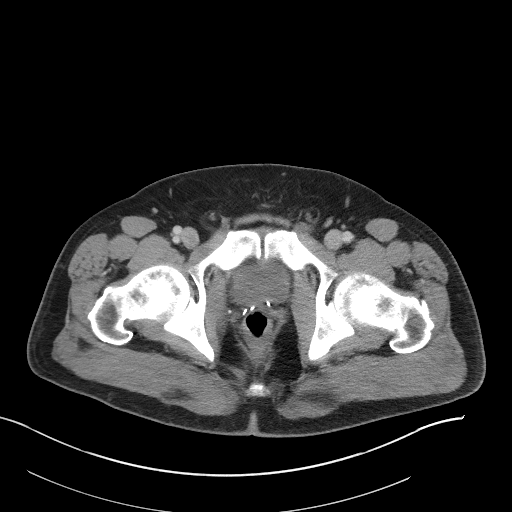
[im 34/109  soft-tissue]
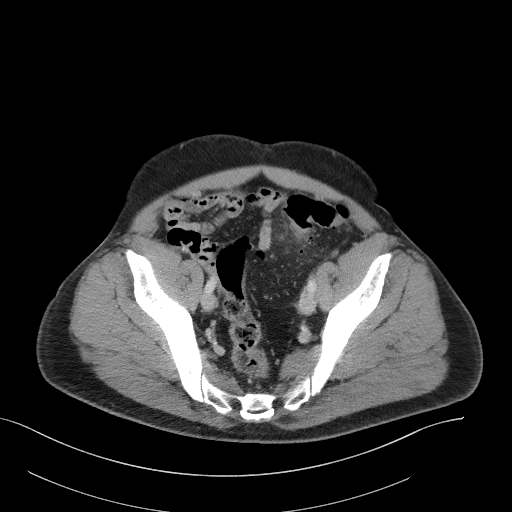
[im 41/109  soft-tissue]
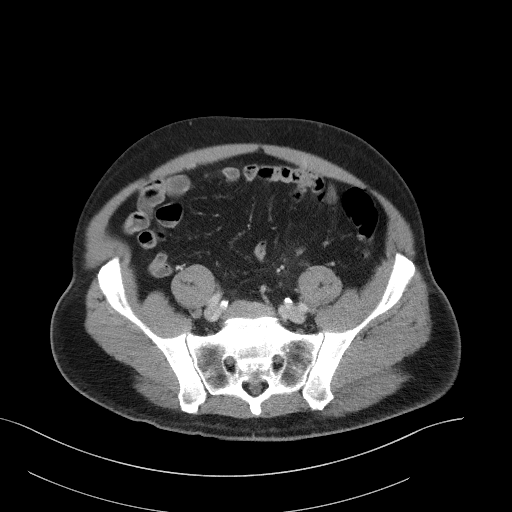
[im 48/109  soft-tissue]
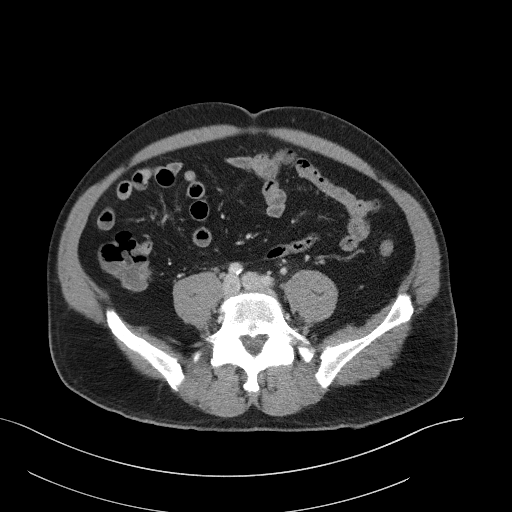
[im 55/109  soft-tissue]
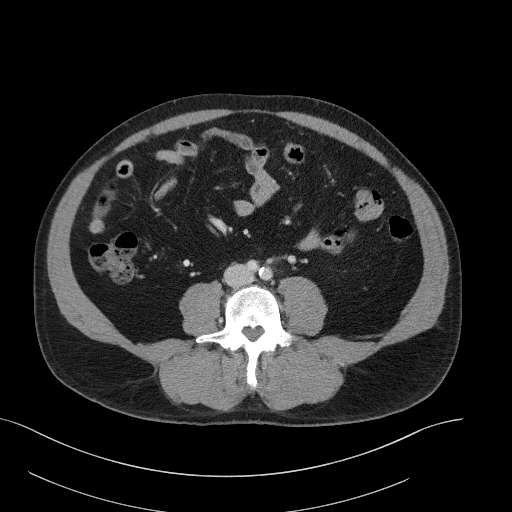
[im 61/109  soft-tissue]
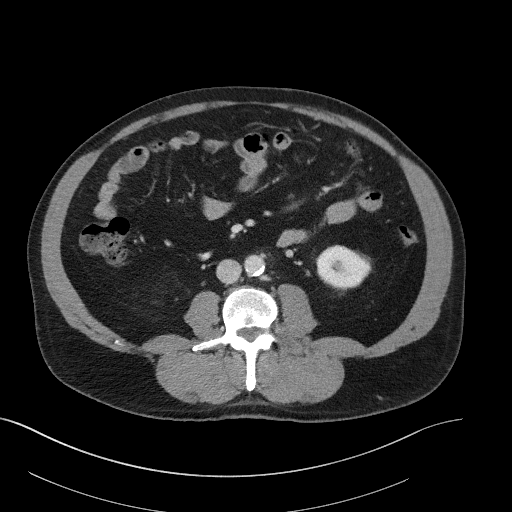
[im 68/109  soft-tissue]
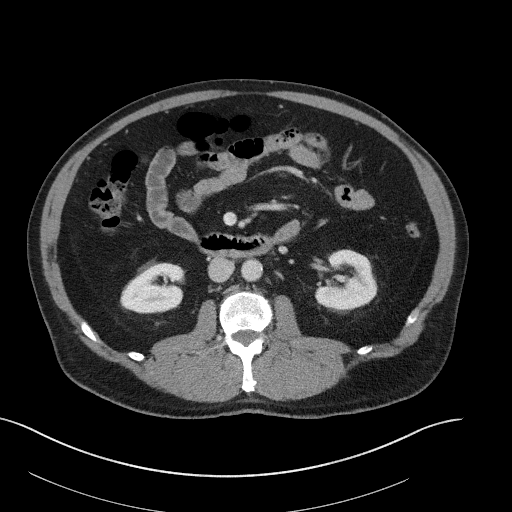
[im 68/109  bone]
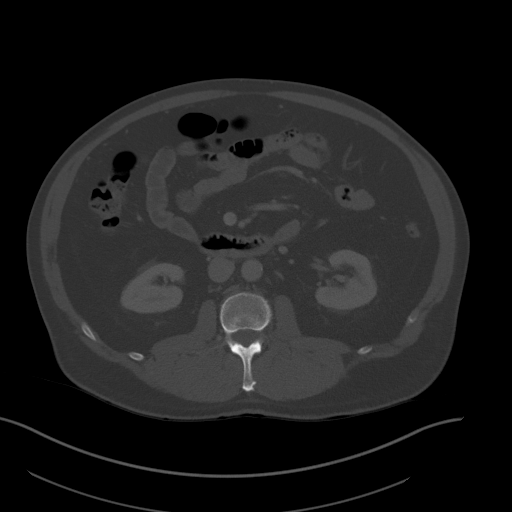
[im 75/109  soft-tissue]
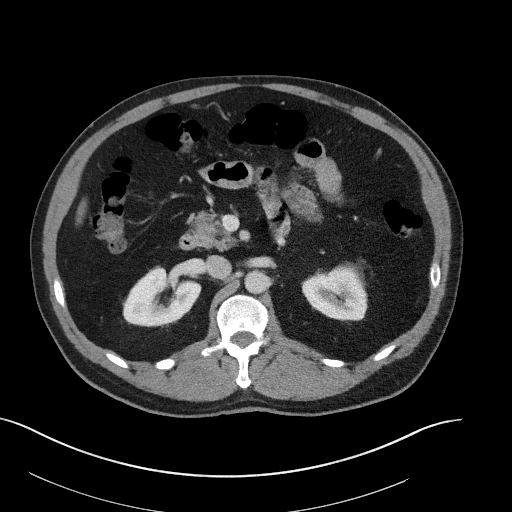
[im 88/109  soft-tissue]
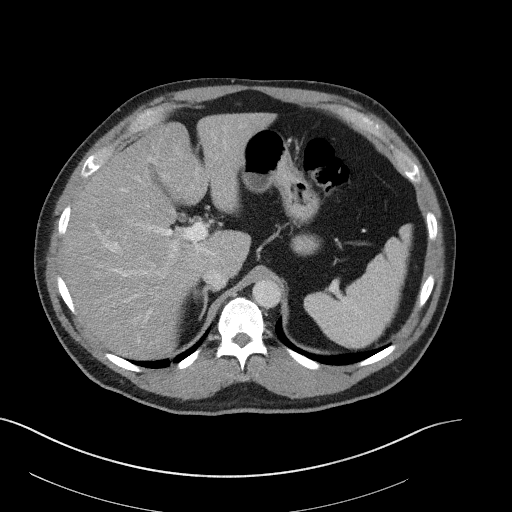
[im 95/109  soft-tissue]
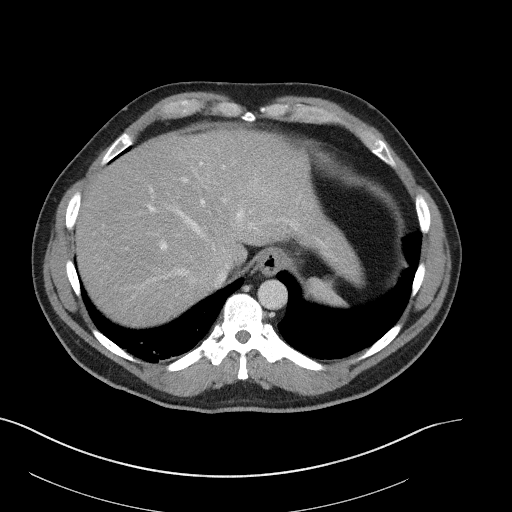
[im 102/109  soft-tissue]
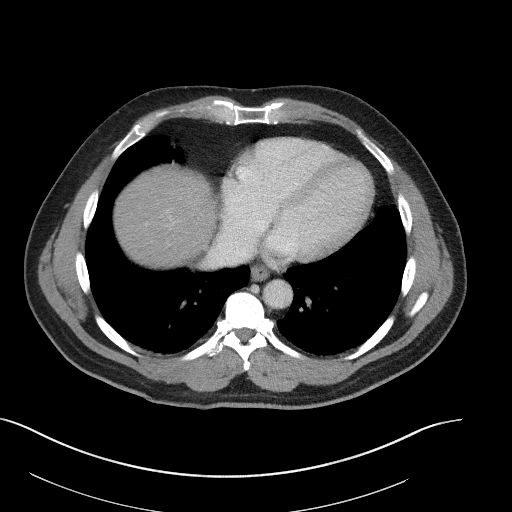

[Series 5: coronal st · coronal · 0.86mm/px · 3 of 114 slices shown]
[im 38/114  soft-tissue]
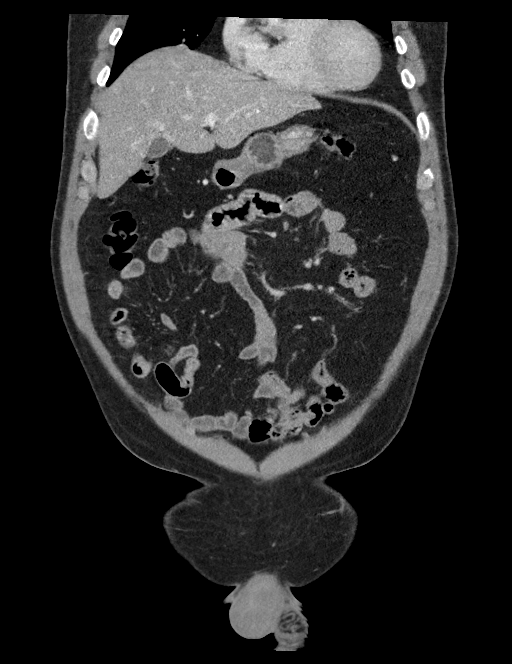
[im 51/114  soft-tissue]
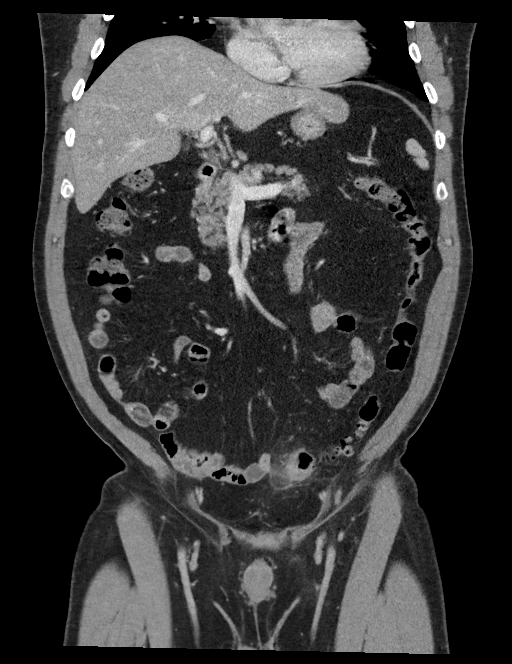
[im 63/114  soft-tissue]
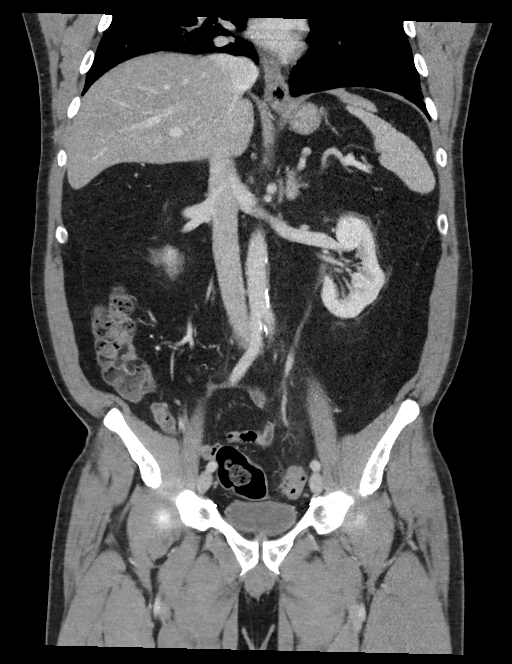

[16 of 46 positions shown; findings below may reference images not displayed]

RADIATION DOSE REDUCTION: This exam was performed according to the
departmental dose-optimization program which includes automated
exposure control, adjustment of the mA and/or kV according to
patient size and/or use of iterative reconstruction technique.

CONTRAST:  100mL OMNIPAQUE IOHEXOL 300 MG/ML  SOLN
FINDINGS: Lower chest: No acute abnormality.

Hepatobiliary: There is diffuse fatty infiltration of the liver.
Gallbladder and bile ducts are within normal limits.

Pancreas: Unremarkable. No pancreatic ductal dilatation or
surrounding inflammatory changes.

Spleen: Normal in size without focal abnormality.

Adrenals/Urinary Tract: Adrenal glands are unremarkable. Kidneys are
normal, without renal calculi, focal lesion, or hydronephrosis.
Bladder is unremarkable.

Stomach/Bowel: There is sigmoid colon diverticulosis. There is wall
thickening and inflammation involving the proximal sigmoid colon
compatible with acute diverticulitis. There is no evidence for
perforation or abscess. There is no bowel obstruction. Appendix,
small bowel and stomach are within normal limits.

Vascular/Lymphatic: Aortic atherosclerosis. No enlarged abdominal or
pelvic lymph nodes.

Reproductive: Status post prostatectomy.

Other: There is trace free fluid in the pelvis. There are surgical
clips along the bilateral pelvic sidewalls. Small fat containing
umbilical hernia.

Musculoskeletal: No acute or significant osseous findings.
IMPRESSION: 1. Acute uncomplicated sigmoid colon diverticulitis.
2. Trace free fluid in the pelvis.
3. Prostatectomy.
4. Fatty infiltration of the liver.
5.  Aortic Atherosclerosis ([id number]).

## 2023-11-25 DIAGNOSIS — M5116 Intervertebral disc disorders with radiculopathy, lumbar region: Secondary | ICD-10-CM | POA: Diagnosis not present

## 2023-11-25 DIAGNOSIS — Z6827 Body mass index (BMI) 27.0-27.9, adult: Secondary | ICD-10-CM | POA: Diagnosis not present

## 2023-11-27 ENCOUNTER — Other Ambulatory Visit: Payer: Self-pay | Admitting: Family Medicine

## 2023-11-27 DIAGNOSIS — M5432 Sciatica, left side: Secondary | ICD-10-CM

## 2023-11-30 ENCOUNTER — Ambulatory Visit (INDEPENDENT_AMBULATORY_CARE_PROVIDER_SITE_OTHER): Admitting: Family Medicine

## 2023-11-30 ENCOUNTER — Encounter: Payer: Self-pay | Admitting: Family Medicine

## 2023-11-30 ENCOUNTER — Telehealth: Payer: Self-pay | Admitting: Family Medicine

## 2023-11-30 ENCOUNTER — Telehealth: Payer: Self-pay

## 2023-11-30 VITALS — BP 87/57 | HR 95 | Temp 98.1°F | Ht 67.0 in | Wt 175.0 lb

## 2023-11-30 DIAGNOSIS — E1165 Type 2 diabetes mellitus with hyperglycemia: Secondary | ICD-10-CM

## 2023-11-30 DIAGNOSIS — M5432 Sciatica, left side: Secondary | ICD-10-CM

## 2023-11-30 DIAGNOSIS — E785 Hyperlipidemia, unspecified: Secondary | ICD-10-CM | POA: Diagnosis not present

## 2023-11-30 DIAGNOSIS — M5431 Sciatica, right side: Secondary | ICD-10-CM

## 2023-11-30 DIAGNOSIS — I10 Essential (primary) hypertension: Secondary | ICD-10-CM

## 2023-11-30 DIAGNOSIS — Z0279 Encounter for issue of other medical certificate: Secondary | ICD-10-CM

## 2023-11-30 LAB — CMP14+EGFR
ALT: 38 IU/L (ref 0–44)
AST: 19 IU/L (ref 0–40)
Albumin: 4 g/dL (ref 3.9–4.9)
Alkaline Phosphatase: 120 IU/L (ref 44–121)
BUN/Creatinine Ratio: 15 (ref 10–24)
BUN: 20 mg/dL (ref 8–27)
Bilirubin Total: 0.8 mg/dL (ref 0.0–1.2)
CO2: 24 mmol/L (ref 20–29)
Calcium: 8.9 mg/dL (ref 8.6–10.2)
Chloride: 90 mmol/L — ABNORMAL LOW (ref 96–106)
Creatinine, Ser: 1.3 mg/dL — ABNORMAL HIGH (ref 0.76–1.27)
Globulin, Total: 2.2 g/dL (ref 1.5–4.5)
Glucose: 144 mg/dL — ABNORMAL HIGH (ref 70–99)
Potassium: 3.8 mmol/L (ref 3.5–5.2)
Sodium: 134 mmol/L (ref 134–144)
Total Protein: 6.2 g/dL (ref 6.0–8.5)
eGFR: 62 mL/min/{1.73_m2} (ref >59)

## 2023-11-30 LAB — BAYER DCA HB A1C WAIVED: HB A1C (BAYER DCA - WAIVED): 7.6 % — ABNORMAL HIGH (ref 4.8–5.6)

## 2023-11-30 LAB — LIPID PANEL
Chol/HDL Ratio: 3.1 ratio (ref 0.0–5.0)
Cholesterol, Total: 129 mg/dL (ref 100–199)
HDL: 41 mg/dL (ref >39)
LDL Chol Calc (NIH): 59 mg/dL (ref 0–99)
Triglycerides: 173 mg/dL — ABNORMAL HIGH (ref 0–149)
VLDL Cholesterol Cal: 29 mg/dL (ref 5–40)

## 2023-11-30 MED ORDER — LISINOPRIL 40 MG PO TABS: 40.0000 mg | ORAL_TABLET | Freq: Every day | ORAL | 3 refills | Status: AC

## 2023-11-30 MED ORDER — TIZANIDINE HCL 4 MG PO TABS: 4.0000 mg | ORAL_TABLET | Freq: Four times a day (QID) | ORAL | 1 refills | Status: DC | PRN

## 2023-11-30 MED ORDER — HYDROCHLOROTHIAZIDE 25 MG PO TABS: 12.5000 mg | ORAL_TABLET | Freq: Every day | ORAL | Status: DC

## 2023-11-30 NOTE — Telephone Encounter (Signed)
 pt dropped off FMLA forms to be completed and signed.  Form Fee Paid? (Y/N)       YES     If NO, form is placed on front office manager desk to hold until payment received. If YES, then form will be placed in the RX/HH Nurse Coordinators box for completion.  Form will not be processed until payment is received

## 2023-11-30 NOTE — Progress Notes (Signed)
 Subjective:  Patient ID: Anthony Austin, male    DOB: 1961/01/09  Age: 63 y.o. MRN: [id number]  CC: Diabetes (Pended ) and Procedure (Waiting to schedule back surgery. Given gabapentin  and oxy to manage pain. )   HPI Anthony Austin presents for excruciating back pain. He saw P.A. at neurosurgery and is awaiting a call to tell him the date of surgery. He continues to use gabapentin and oxycodone  to manage his pain.  Pain is frequently 10/10.  It is totally debilitating.  He is now out of work.  He can sit for short periods of time.  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  Follow-up of hypertension. Patient has no history of headache chest pain or shortness of breath or recent cough. Patient also denies symptoms of TIA such as numbness weakness lateralizing. Patient checks  blood pressure at home and has not had any elevated readings recently. Patient denies side effects from his medication. States taking it regularly. Patient in for follow-up of elevated cholesterol. Doing well without complaints on current medication. Denies side effects of statin including myalgia and arthralgia and nausea. Also in today for liver function testing. Currently no chest pain, shortness of breath or other cardiovascular related symptoms noted.  Follow-up of diabetes. Patient does not check blood sugar at home Patient denies symptoms such as polyuria, polydipsia, excessive hunger, nausea No significant hypoglycemic spells noted. Medications as noted below. Taking them regularly without complication/adverse reaction being reported today.     06/07/2023   10:31 AM 05/05/2023    9:27 AM 02/01/2023    8:23 AM  Depression screen PHQ 2/9  Decreased Interest 0 0 0  Down, Depressed, Hopeless 0 0 0  PHQ - 2 Score 0 0 0    History Anthony Austin has a past medical history of Diabetes mellitus without complication (HCC), Heat exhaustion, Hyperlipidemia,  Hypertension, and Prostate CA (HCC) (2005).   He has a past surgical history that includes Prostate surgery; Prostatectomy (2005); Prostatectomy; and Colonoscopy (07/31/2010).   His family history includes Cancer in his father; Diabetes in his mother; Hypertension in his father and mother; Stroke in his mother.He reports that he has never smoked. He has never used smokeless tobacco. He reports that he does not drink alcohol and does not use drugs.    ROS Review of Systems  Objective:  BP (!) 87/57   Pulse 95   Temp 98.1 F (36.7 C)   Ht 5' 7 (1.702 m)   Wt 175 lb (79.4 kg)   SpO2 98%   BMI 27.41 kg/m   BP Readings from Last 3 Encounters:  11/30/23 (!) 87/57  11/19/23 106/78  11/14/23 115/88    Wt Readings from Last 3 Encounters:  11/30/23 175 lb (79.4 kg)  11/19/23 174 lb (78.9 kg)  11/14/23 186 lb 15.2 oz (84.8 kg)     Physical Exam   Assessment & Plan:  Type 2 diabetes mellitus with hyperglycemia, without long-term current use of insulin (HCC) -     Bayer DCA Hb A1c Waived  Essential hypertension -     CMP14+EGFR -     hydroCHLOROthiazide ; Take 0.5 tablets (12.5 mg total) by mouth daily. -     Lisinopril ; Take 1 tablet (40 mg total) by mouth daily. One tablet or half depending on BP  Dispense: 90 tablet; Refill: 3  Hyperlipidemia, unspecified hyperlipidemia type -     CMP14+EGFR -  Lipid panel  Bilateral sciatica -     tiZANidine  HCl; Take 1 tablet (4 mg total) by mouth every 6 (six) hours as needed for muscle spasms.  Dispense: 30 tablet; Refill: 1  Hold  HCTZ due to low blood pressure today.  After the patient left the office at the end of his visit we got a call back from him stating his surgery had been scheduled for July 3.   Follow-up: Return in about 3 months (around 03/01/2024) for diabetes.  Roise Cleaver, M.D.

## 2023-11-30 NOTE — Patient Instructions (Signed)
 Do Not take the HCTZ unless systolic BP goes over 160.

## 2023-11-30 NOTE — Telephone Encounter (Signed)
 For FMLA form

## 2023-11-30 NOTE — Telephone Encounter (Signed)
 Copied from CRM [id number]. Topic: General - Other >> Nov 30, 2023 12:30 PM Anthony Austin wrote: Reason for CRM: Surgery scheduled for July 3- wanted to update office for the [facility] paperwork- [phone]

## 2023-12-02 NOTE — Telephone Encounter (Signed)
 PCP completed and signed FMLA forms. They have been faxed to [employer] at fax number [phone]. Patient has been contacted and informed they are complete. Copy at front desk.

## 2023-12-07 ENCOUNTER — Telehealth: Payer: Self-pay | Admitting: Family Medicine

## 2023-12-07 ENCOUNTER — Ambulatory Visit: Payer: Self-pay | Admitting: Family Medicine

## 2023-12-07 NOTE — Telephone Encounter (Signed)
 Result encounter updated

## 2023-12-07 NOTE — Telephone Encounter (Signed)
 Copied from CRM [id number]. Topic: Clinical - Lab/Test Results >> Dec 07, 2023  3:43 PM Elle L wrote: Reason for CRM: The patient was calling back regarding his lab results. I read the note verbatim and he expressed understanding.

## 2023-12-23 DIAGNOSIS — M5127 Other intervertebral disc displacement, lumbosacral region: Secondary | ICD-10-CM | POA: Diagnosis not present

## 2023-12-23 DIAGNOSIS — M5117 Intervertebral disc disorders with radiculopathy, lumbosacral region: Secondary | ICD-10-CM | POA: Diagnosis not present

## 2023-12-23 DIAGNOSIS — M5116 Intervertebral disc disorders with radiculopathy, lumbar region: Secondary | ICD-10-CM | POA: Diagnosis not present

## 2024-01-16 DIAGNOSIS — R42 Dizziness and giddiness: Secondary | ICD-10-CM | POA: Diagnosis not present

## 2024-02-05 DIAGNOSIS — L600 Ingrowing nail: Secondary | ICD-10-CM | POA: Diagnosis not present

## 2024-02-07 ENCOUNTER — Other Ambulatory Visit: Payer: Self-pay

## 2024-02-07 ENCOUNTER — Emergency Department (HOSPITAL_COMMUNITY)
Admission: EM | Admit: 2024-02-07 | Discharge: 2024-02-08 | Disposition: A | Discharge status: Home / Self Care | Attending: Emergency Medicine | Admitting: Emergency Medicine

## 2024-02-07 ENCOUNTER — Encounter (HOSPITAL_COMMUNITY): Payer: Self-pay | Admitting: Emergency Medicine

## 2024-02-07 DIAGNOSIS — R1031 Right lower quadrant pain: Secondary | ICD-10-CM | POA: Diagnosis not present

## 2024-02-07 DIAGNOSIS — K353 Acute appendicitis with localized peritonitis, without perforation or gangrene: Secondary | ICD-10-CM | POA: Diagnosis not present

## 2024-02-07 DIAGNOSIS — Z8546 Personal history of malignant neoplasm of prostate: Secondary | ICD-10-CM | POA: Insufficient documentation

## 2024-02-07 DIAGNOSIS — R509 Fever, unspecified: Secondary | ICD-10-CM | POA: Diagnosis not present

## 2024-02-07 DIAGNOSIS — K5750 Diverticulosis of both small and large intestine without perforation or abscess without bleeding: Secondary | ICD-10-CM | POA: Diagnosis not present

## 2024-02-07 DIAGNOSIS — R1033 Periumbilical pain: Secondary | ICD-10-CM | POA: Diagnosis not present

## 2024-02-07 DIAGNOSIS — Z79899 Other long term (current) drug therapy: Secondary | ICD-10-CM | POA: Diagnosis not present

## 2024-02-07 DIAGNOSIS — I10 Essential (primary) hypertension: Secondary | ICD-10-CM | POA: Diagnosis not present

## 2024-02-07 DIAGNOSIS — Z0389 Encounter for observation for other suspected diseases and conditions ruled out: Secondary | ICD-10-CM | POA: Diagnosis not present

## 2024-02-07 DIAGNOSIS — E119 Type 2 diabetes mellitus without complications: Secondary | ICD-10-CM | POA: Insufficient documentation

## 2024-02-07 DIAGNOSIS — D72829 Elevated white blood cell count, unspecified: Secondary | ICD-10-CM | POA: Insufficient documentation

## 2024-02-07 DIAGNOSIS — R1030 Lower abdominal pain, unspecified: Secondary | ICD-10-CM | POA: Insufficient documentation

## 2024-02-07 DIAGNOSIS — R000 Tachycardia, unspecified: Secondary | ICD-10-CM | POA: Diagnosis not present

## 2024-02-07 DIAGNOSIS — K5792 Diverticulitis of intestine, part unspecified, without perforation or abscess without bleeding: Secondary | ICD-10-CM | POA: Diagnosis not present

## 2024-02-07 DIAGNOSIS — K3580 Unspecified acute appendicitis: Secondary | ICD-10-CM | POA: Diagnosis not present

## 2024-02-07 NOTE — ED Triage Notes (Signed)
 Pt c/o lower abd pain since 3am.

## 2024-02-08 ENCOUNTER — Emergency Department (HOSPITAL_COMMUNITY): Admitting: Anesthesiology

## 2024-02-08 ENCOUNTER — Emergency Department (HOSPITAL_COMMUNITY)

## 2024-02-08 ENCOUNTER — Observation Stay (HOSPITAL_COMMUNITY)
Admission: EM | Admit: 2024-02-08 | Discharge: 2024-02-09 | Disposition: A | Discharge status: Home / Self Care | Source: Ambulatory Visit | Attending: General Surgery | Admitting: General Surgery

## 2024-02-08 ENCOUNTER — Encounter (HOSPITAL_COMMUNITY): Payer: Self-pay | Admitting: Emergency Medicine

## 2024-02-08 ENCOUNTER — Encounter (HOSPITAL_COMMUNITY)
Admission: EM | Disposition: A | Discharge status: Home / Self Care | Payer: Self-pay | Source: Ambulatory Visit | Attending: Emergency Medicine

## 2024-02-08 ENCOUNTER — Other Ambulatory Visit: Payer: Self-pay

## 2024-02-08 DIAGNOSIS — K3580 Unspecified acute appendicitis: Principal | ICD-10-CM

## 2024-02-08 DIAGNOSIS — Z0389 Encounter for observation for other suspected diseases and conditions ruled out: Secondary | ICD-10-CM | POA: Diagnosis not present

## 2024-02-08 DIAGNOSIS — R1033 Periumbilical pain: Secondary | ICD-10-CM

## 2024-02-08 DIAGNOSIS — K353 Acute appendicitis with localized peritonitis, without perforation or gangrene: Secondary | ICD-10-CM | POA: Diagnosis not present

## 2024-02-08 DIAGNOSIS — R509 Fever, unspecified: Secondary | ICD-10-CM

## 2024-02-08 DIAGNOSIS — Z9049 Acquired absence of other specified parts of digestive tract: Principal | ICD-10-CM

## 2024-02-08 DIAGNOSIS — Z8546 Personal history of malignant neoplasm of prostate: Secondary | ICD-10-CM | POA: Insufficient documentation

## 2024-02-08 DIAGNOSIS — K5750 Diverticulosis of both small and large intestine without perforation or abscess without bleeding: Secondary | ICD-10-CM | POA: Diagnosis not present

## 2024-02-08 DIAGNOSIS — Z79899 Other long term (current) drug therapy: Secondary | ICD-10-CM | POA: Insufficient documentation

## 2024-02-08 DIAGNOSIS — R000 Tachycardia, unspecified: Secondary | ICD-10-CM | POA: Insufficient documentation

## 2024-02-08 DIAGNOSIS — I10 Essential (primary) hypertension: Secondary | ICD-10-CM | POA: Insufficient documentation

## 2024-02-08 DIAGNOSIS — K5792 Diverticulitis of intestine, part unspecified, without perforation or abscess without bleeding: Secondary | ICD-10-CM | POA: Diagnosis not present

## 2024-02-08 DIAGNOSIS — E119 Type 2 diabetes mellitus without complications: Secondary | ICD-10-CM | POA: Insufficient documentation

## 2024-02-08 HISTORY — PX: XI ROBOTIC LAPAROSCOPIC ASSISTED APPENDECTOMY: SHX6877

## 2024-02-08 LAB — URINALYSIS, W/ REFLEX TO CULTURE (INFECTION SUSPECTED)
Bacteria, UA: NONE SEEN
Bilirubin Urine: NEGATIVE
Glucose, UA: 50 mg/dL — AB
Hgb urine dipstick: NEGATIVE
Ketones, ur: 5 mg/dL — AB
Leukocytes,Ua: NEGATIVE
Nitrite: NEGATIVE
Protein, ur: NEGATIVE mg/dL
Specific Gravity, Urine: 1.031 — ABNORMAL HIGH (ref 1.005–1.030)
pH: 5 (ref 5.0–8.0)

## 2024-02-08 LAB — COMPREHENSIVE METABOLIC PANEL WITH GFR
ALT: 26 U/L (ref 0–44)
AST: 20 U/L (ref 15–41)
Albumin: 3.9 g/dL (ref 3.5–5.0)
Alkaline Phosphatase: 68 U/L (ref 38–126)
Anion gap: 12 (ref 5–15)
BUN: 15 mg/dL (ref 8–23)
CO2: 26 mmol/L (ref 22–32)
Calcium: 9 mg/dL (ref 8.9–10.3)
Chloride: 97 mmol/L — ABNORMAL LOW (ref 98–111)
Creatinine, Ser: 1.01 mg/dL (ref 0.61–1.24)
GFR, Estimated: >60 mL/min (ref >60)
Glucose, Bld: 176 mg/dL — ABNORMAL HIGH (ref 70–99)
Potassium: 3.2 mmol/L — ABNORMAL LOW (ref 3.5–5.1)
Sodium: 135 mmol/L (ref 135–145)
Total Bilirubin: 2 mg/dL — ABNORMAL HIGH (ref 0.0–1.2)
Total Protein: 7.2 g/dL (ref 6.5–8.1)

## 2024-02-08 LAB — URINALYSIS, ROUTINE W REFLEX MICROSCOPIC
Bacteria, UA: NONE SEEN
Bilirubin Urine: NEGATIVE
Glucose, UA: NEGATIVE mg/dL
Hgb urine dipstick: NEGATIVE
Ketones, ur: NEGATIVE mg/dL
Leukocytes,Ua: NEGATIVE
Nitrite: NEGATIVE
Protein, ur: 30 mg/dL — AB
Specific Gravity, Urine: 1.031 — ABNORMAL HIGH (ref 1.005–1.030)
pH: 5 (ref 5.0–8.0)

## 2024-02-08 LAB — CBC WITH DIFFERENTIAL/PLATELET
Abs Immature Granulocytes: 0.03 K/uL (ref 0.00–0.07)
Abs Immature Granulocytes: 0.07 K/uL (ref 0.00–0.07)
Basophils Absolute: 0 K/uL (ref 0.0–0.1)
Basophils Absolute: 0.1 K/uL (ref 0.0–0.1)
Basophils Relative: 0 %
Basophils Relative: 0 %
Eosinophils Absolute: 0.1 K/uL (ref 0.0–0.5)
Eosinophils Absolute: 0.2 K/uL (ref 0.0–0.5)
Eosinophils Relative: 1 %
Eosinophils Relative: 1 %
HCT: 41.5 % (ref 39.0–52.0)
HCT: 42.5 % (ref 39.0–52.0)
Hemoglobin: 14.4 g/dL (ref 13.0–17.0)
Hemoglobin: 14.9 g/dL (ref 13.0–17.0)
Immature Granulocytes: 0 %
Immature Granulocytes: 0 %
Lymphocytes Relative: 25 %
Lymphocytes Relative: 8 %
Lymphs Abs: 1.4 K/uL (ref 0.7–4.0)
Lymphs Abs: 2.7 K/uL (ref 0.7–4.0)
MCH: 32.5 pg (ref 26.0–34.0)
MCH: 32.9 pg (ref 26.0–34.0)
MCHC: 34.7 g/dL (ref 30.0–36.0)
MCHC: 35.1 g/dL (ref 30.0–36.0)
MCV: 93.7 fL (ref 80.0–100.0)
MCV: 93.8 fL (ref 80.0–100.0)
Monocytes Absolute: 0.7 K/uL (ref 0.1–1.0)
Monocytes Absolute: 0.9 K/uL (ref 0.1–1.0)
Monocytes Relative: 5 %
Monocytes Relative: 6 %
Neutro Abs: 15 K/uL — ABNORMAL HIGH (ref 1.7–7.7)
Neutro Abs: 7.3 K/uL (ref 1.7–7.7)
Neutrophils Relative %: 68 %
Neutrophils Relative %: 86 %
Platelets: 261 K/uL (ref 150–400)
Platelets: 284 K/uL (ref 150–400)
RBC: 4.43 MIL/uL (ref 4.22–5.81)
RBC: 4.53 MIL/uL (ref 4.22–5.81)
RDW: 12.7 % (ref 11.5–15.5)
RDW: 12.8 % (ref 11.5–15.5)
WBC: 10.9 K/uL — ABNORMAL HIGH (ref 4.0–10.5)
WBC: 17.5 K/uL — ABNORMAL HIGH (ref 4.0–10.5)
nRBC: 0 % (ref 0.0–0.2)
nRBC: 0 % (ref 0.0–0.2)

## 2024-02-08 LAB — BASIC METABOLIC PANEL WITH GFR
Anion gap: 12 (ref 5–15)
BUN: 15 mg/dL (ref 8–23)
CO2: 27 mmol/L (ref 22–32)
Calcium: 9.3 mg/dL (ref 8.9–10.3)
Chloride: 100 mmol/L (ref 98–111)
Creatinine, Ser: 1.06 mg/dL (ref 0.61–1.24)
GFR, Estimated: >60 mL/min (ref >60)
Glucose, Bld: 111 mg/dL — ABNORMAL HIGH (ref 70–99)
Potassium: 3.2 mmol/L — ABNORMAL LOW (ref 3.5–5.1)
Sodium: 139 mmol/L (ref 135–145)

## 2024-02-08 LAB — RESP PANEL BY RT-PCR (RSV, FLU A&B, COVID)  RVPGX2
Influenza A by PCR: NEGATIVE
Influenza B by PCR: NEGATIVE
Resp Syncytial Virus by PCR: NEGATIVE
SARS Coronavirus 2 by RT PCR: NEGATIVE

## 2024-02-08 LAB — LACTIC ACID, PLASMA
Lactic Acid, Venous: 2 mmol/L (ref 0.5–1.9)
Lactic Acid, Venous: 3.2 mmol/L (ref 0.5–1.9)

## 2024-02-08 LAB — PROTIME-INR
INR: 1.1 (ref 0.8–1.2)
Prothrombin Time: 14.5 s (ref 11.4–15.2)

## 2024-02-08 LAB — GLUCOSE, CAPILLARY: Glucose-Capillary: 131 mg/dL — ABNORMAL HIGH (ref 70–99)

## 2024-02-08 SURGERY — APPENDECTOMY, ROBOT-ASSISTED, LAPAROSCOPIC
Anesthesia: General

## 2024-02-08 MED ORDER — ROCURONIUM BROMIDE 10 MG/ML (PF) SYRINGE
PREFILLED_SYRINGE | INTRAVENOUS | Status: DC | PRN
  Administered 2024-02-08: 40 mg via INTRAVENOUS

## 2024-02-08 MED ORDER — MIDAZOLAM HCL 5 MG/5ML IJ SOLN
INTRAMUSCULAR | Status: DC | PRN
  Administered 2024-02-08: 2 mg via INTRAVENOUS

## 2024-02-08 MED ORDER — FENTANYL CITRATE PF 50 MCG/ML IJ SOSY: 25.0000 ug | PREFILLED_SYRINGE | INTRAMUSCULAR | Status: DC | PRN

## 2024-02-08 MED ORDER — SODIUM CHLORIDE 0.9 % IV SOLN
2.0000 g | Freq: Once | INTRAVENOUS | Status: AC
  Administered 2024-02-08: 2 g via INTRAVENOUS
  Filled 2024-02-08: qty 20

## 2024-02-08 MED ORDER — HYDROCHLOROTHIAZIDE 25 MG PO TABS
25.0000 mg | ORAL_TABLET | Freq: Every day | ORAL | Status: DC
  Administered 2024-02-09: 25 mg via ORAL
  Filled 2024-02-08 (×3): qty 1

## 2024-02-08 MED ORDER — HYDROMORPHONE HCL 1 MG/ML IJ SOLN
0.5000 mg | Freq: Once | INTRAMUSCULAR | Status: AC
  Administered 2024-02-08: 0.5 mg via INTRAVENOUS
  Filled 2024-02-08: qty 0.5

## 2024-02-08 MED ORDER — SODIUM CHLORIDE 0.9 % IV SOLN: 2.0000 g | INTRAVENOUS | Status: DC

## 2024-02-08 MED ORDER — ACETAMINOPHEN 650 MG RE SUPP: 650.0000 mg | Freq: Four times a day (QID) | RECTAL | Status: DC | PRN

## 2024-02-08 MED ORDER — 0.9 % SODIUM CHLORIDE (POUR BTL) OPTIME
TOPICAL | Status: DC | PRN
  Administered 2024-02-08: 1000 mL

## 2024-02-08 MED ORDER — SODIUM CHLORIDE 0.9 % IV SOLN: INTRAVENOUS | Status: DC

## 2024-02-08 MED ORDER — ENOXAPARIN SODIUM 40 MG/0.4ML IJ SOSY
40.0000 mg | PREFILLED_SYRINGE | INTRAMUSCULAR | Status: DC
  Administered 2024-02-09: 40 mg via SUBCUTANEOUS
  Filled 2024-02-08: qty 0.4

## 2024-02-08 MED ORDER — LACTATED RINGERS IV SOLN: INTRAVENOUS | Status: DC

## 2024-02-08 MED ORDER — OXYCODONE HCL 5 MG PO TABS: 5.0000 mg | ORAL_TABLET | Freq: Once | ORAL | Status: DC | PRN

## 2024-02-08 MED ORDER — CHLORHEXIDINE GLUCONATE 0.12 % MT SOLN: 15.0000 mL | Freq: Once | OROMUCOSAL | Status: DC

## 2024-02-08 MED ORDER — FENTANYL CITRATE (PF) 100 MCG/2ML IJ SOLN
INTRAMUSCULAR | Status: DC | PRN
  Administered 2024-02-08: 50 ug via INTRAVENOUS

## 2024-02-08 MED ORDER — GABAPENTIN 300 MG PO CAPS
600.0000 mg | ORAL_CAPSULE | Freq: Three times a day (TID) | ORAL | Status: DC
  Administered 2024-02-09 (×2): 600 mg via ORAL
  Filled 2024-02-08 (×2): qty 2

## 2024-02-08 MED ORDER — BUPIVACAINE HCL (PF) 0.5 % IJ SOLN
INTRAMUSCULAR | Status: AC
  Filled 2024-02-08: qty 30

## 2024-02-08 MED ORDER — MIDAZOLAM HCL 2 MG/2ML IJ SOLN
INTRAMUSCULAR | Status: AC
  Filled 2024-02-08: qty 2

## 2024-02-08 MED ORDER — CHLORHEXIDINE GLUCONATE CLOTH 2 % EX PADS: 6.0000 | MEDICATED_PAD | Freq: Once | CUTANEOUS | Status: DC

## 2024-02-08 MED ORDER — KETOROLAC TROMETHAMINE 30 MG/ML IJ SOLN: 30.0000 mg | Freq: Four times a day (QID) | INTRAMUSCULAR | Status: DC | PRN

## 2024-02-08 MED ORDER — OXYCODONE HCL 5 MG PO TABS: 5.0000 mg | ORAL_TABLET | ORAL | Status: DC | PRN

## 2024-02-08 MED ORDER — ACETAMINOPHEN 325 MG PO TABS
650.0000 mg | ORAL_TABLET | Freq: Four times a day (QID) | ORAL | Status: DC | PRN
  Administered 2024-02-09: 650 mg via ORAL
  Filled 2024-02-08: qty 2

## 2024-02-08 MED ORDER — LACTATED RINGERS IV BOLUS (SEPSIS)
1000.0000 mL | Freq: Once | INTRAVENOUS | Status: AC
  Administered 2024-02-08: 1000 mL via INTRAVENOUS

## 2024-02-08 MED ORDER — SUCCINYLCHOLINE CHLORIDE 200 MG/10ML IV SOSY
PREFILLED_SYRINGE | INTRAVENOUS | Status: AC
  Filled 2024-02-08: qty 10

## 2024-02-08 MED ORDER — ONDANSETRON 4 MG PO TBDP: 4.0000 mg | ORAL_TABLET | Freq: Four times a day (QID) | ORAL | Status: DC | PRN

## 2024-02-08 MED ORDER — KETOROLAC TROMETHAMINE 30 MG/ML IJ SOLN
30.0000 mg | Freq: Four times a day (QID) | INTRAMUSCULAR | Status: AC
  Administered 2024-02-09: 30 mg via INTRAVENOUS
  Filled 2024-02-08: qty 1

## 2024-02-08 MED ORDER — METRONIDAZOLE 500 MG/100ML IV SOLN
500.0000 mg | Freq: Once | INTRAVENOUS | Status: AC
  Administered 2024-02-08: 500 mg via INTRAVENOUS
  Filled 2024-02-08: qty 100

## 2024-02-08 MED ORDER — HYDROMORPHONE HCL 1 MG/ML IJ SOLN: 1.0000 mg | INTRAMUSCULAR | Status: DC | PRN

## 2024-02-08 MED ORDER — ACETAMINOPHEN 325 MG PO TABS
650.0000 mg | ORAL_TABLET | Freq: Once | ORAL | Status: AC
  Administered 2024-02-08: 650 mg via ORAL
  Filled 2024-02-08: qty 2

## 2024-02-08 MED ORDER — OXYCODONE HCL 5 MG/5ML PO SOLN: 5.0000 mg | Freq: Once | ORAL | Status: DC | PRN

## 2024-02-08 MED ORDER — LACTATED RINGERS IV SOLN: INTRAVENOUS | Status: DC | PRN

## 2024-02-08 MED ORDER — ORAL CARE MOUTH RINSE: 15.0000 mL | Freq: Once | OROMUCOSAL | Status: DC

## 2024-02-08 MED ORDER — PROPOFOL 10 MG/ML IV BOLUS
INTRAVENOUS | Status: AC
  Filled 2024-02-08: qty 20

## 2024-02-08 MED ORDER — SUCCINYLCHOLINE CHLORIDE 200 MG/10ML IV SOSY
PREFILLED_SYRINGE | INTRAVENOUS | Status: DC | PRN
  Administered 2024-02-08: 100 mg via INTRAVENOUS

## 2024-02-08 MED ORDER — FENTANYL CITRATE (PF) 100 MCG/2ML IJ SOLN
INTRAMUSCULAR | Status: AC
  Filled 2024-02-08: qty 2

## 2024-02-08 MED ORDER — AMOXICILLIN-POT CLAVULANATE 875-125 MG PO TABS: 1.0000 | ORAL_TABLET | Freq: Two times a day (BID) | ORAL | 0 refills | Status: DC

## 2024-02-08 MED ORDER — ONDANSETRON HCL 4 MG/2ML IJ SOLN: 4.0000 mg | Freq: Four times a day (QID) | INTRAMUSCULAR | Status: DC | PRN

## 2024-02-08 MED ORDER — ATORVASTATIN CALCIUM 40 MG PO TABS
40.0000 mg | ORAL_TABLET | Freq: Every day | ORAL | Status: DC
  Administered 2024-02-09: 40 mg via ORAL
  Filled 2024-02-08 (×3): qty 1

## 2024-02-08 MED ORDER — BUPIVACAINE HCL (PF) 0.5 % IJ SOLN
INTRAMUSCULAR | Status: DC | PRN
  Administered 2024-02-08: 30 mL

## 2024-02-08 MED ORDER — ONDANSETRON HCL 4 MG/2ML IJ SOLN: 4.0000 mg | Freq: Once | INTRAMUSCULAR | Status: DC | PRN

## 2024-02-08 MED ORDER — PROPOFOL 10 MG/ML IV BOLUS
INTRAVENOUS | Status: DC | PRN
Start: 2024-02-08 — End: 2024-02-08
  Administered 2024-02-08: 180 mg via INTRAVENOUS

## 2024-02-08 MED ORDER — SUGAMMADEX SODIUM 200 MG/2ML IV SOLN
INTRAVENOUS | Status: AC
  Filled 2024-02-08: qty 2

## 2024-02-08 MED ORDER — SODIUM CHLORIDE 0.9 % IV SOLN: Freq: Once | INTRAVENOUS | Status: AC

## 2024-02-08 MED ORDER — METRONIDAZOLE 500 MG/100ML IV SOLN
500.0000 mg | Freq: Two times a day (BID) | INTRAVENOUS | Status: DC
  Administered 2024-02-09: 500 mg via INTRAVENOUS
  Filled 2024-02-08: qty 100

## 2024-02-08 MED ORDER — KETOROLAC TROMETHAMINE 15 MG/ML IJ SOLN
15.0000 mg | Freq: Once | INTRAMUSCULAR | Status: AC
  Administered 2024-02-08: 15 mg via INTRAVENOUS
  Filled 2024-02-08: qty 1

## 2024-02-08 MED ORDER — ROCURONIUM BROMIDE 10 MG/ML (PF) SYRINGE
PREFILLED_SYRINGE | INTRAVENOUS | Status: AC
  Filled 2024-02-08: qty 10

## 2024-02-08 MED ORDER — SODIUM CHLORIDE 0.9 % IV BOLUS: 500.0000 mL | Freq: Once | INTRAVENOUS | Status: DC

## 2024-02-08 MED ORDER — LISINOPRIL 10 MG PO TABS
40.0000 mg | ORAL_TABLET | Freq: Every day | ORAL | Status: DC
  Administered 2024-02-09: 40 mg via ORAL
  Filled 2024-02-08: qty 4

## 2024-02-08 MED ORDER — SIMETHICONE 80 MG PO CHEW: 40.0000 mg | CHEWABLE_TABLET | Freq: Four times a day (QID) | ORAL | Status: DC | PRN

## 2024-02-08 MED ORDER — IOHEXOL 300 MG/ML  SOLN
100.0000 mL | Freq: Once | INTRAMUSCULAR | Status: AC | PRN
  Administered 2024-02-08: 100 mL via INTRAVENOUS

## 2024-02-08 MED ORDER — MUPIROCIN 2 % EX OINT
1.0000 | TOPICAL_OINTMENT | Freq: Two times a day (BID) | CUTANEOUS | Status: DC
  Administered 2024-02-09: 1 via TOPICAL
  Filled 2024-02-08 (×2): qty 22

## 2024-02-08 SURGICAL SUPPLY — 34 items
BLADE SURG 15 STRL LF DISP TIS (BLADE) IMPLANT
CHLORAPREP W/TINT 26 (MISCELLANEOUS) IMPLANT
COVER LIGHT HANDLE (MISCELLANEOUS) IMPLANT
DERMABOND ADVANCED .7 DNX12 (GAUZE/BANDAGES/DRESSINGS) IMPLANT
DRAPE ARM DVNC X/XI (DISPOSABLE) IMPLANT
DRAPE COLUMN DVNC XI (DISPOSABLE) IMPLANT
ELECTRODE REM PT RTRN 9FT ADLT (ELECTROSURGICAL) IMPLANT
GAUZE 4X4 16PLY ~~LOC~~+RFID DBL (SPONGE) IMPLANT
GLOVE BIO SURGEON STRL SZ 6.5 (GLOVE) IMPLANT
GLOVE BIOGEL PI IND STRL 6.5 (GLOVE) IMPLANT
GLOVE BIOGEL PI IND STRL 7.0 (GLOVE) IMPLANT
GLOVE BIOGEL PI IND STRL 7.5 (GLOVE) IMPLANT
GOWN STRL REUS W/TWL LRG LVL3 (GOWN DISPOSABLE) IMPLANT
IRRIGATOR SUCT 8 DISP DVNC XI (IRRIGATION / IRRIGATOR) IMPLANT
IV NS 1000ML BAXH (IV SOLUTION) IMPLANT
KIT PINK PAD W/HEAD ARM REST (MISCELLANEOUS) IMPLANT
KIT TURNOVER KIT A (KITS) IMPLANT
MANIFOLD NEPTUNE II (INSTRUMENTS) IMPLANT
NDL HYPO 21X1.5 SAFETY (NEEDLE) IMPLANT
NDL INSUFFLATION 14GA 120MM (NEEDLE) IMPLANT
NEEDLE HYPO 21X1.5 SAFETY (NEEDLE) ×1 IMPLANT
NEEDLE INSUFFLATION 14GA 120MM (NEEDLE) ×1 IMPLANT
OBTURATOR OPTICALSTD 8 DVNC (TROCAR) IMPLANT
PACK LAP CHOLE LZT030E (CUSTOM PROCEDURE TRAY) IMPLANT
SEAL UNIV 5-12 XI (MISCELLANEOUS) IMPLANT
SEALER VESSEL EXT DVNC XI (MISCELLANEOUS) IMPLANT
SET BASIN LINEN APH (SET/KITS/TRAYS/PACK) IMPLANT
SET TUBE DA VINCI INSUFFLATOR (TUBING) IMPLANT
STAPLER 30 CRVD 8 SUREFORM (STAPLE) IMPLANT
SUT MNCRL AB 4-0 PS2 18 (SUTURE) IMPLANT
SUT VICRYL 0 UR6 27IN ABS (SUTURE) IMPLANT
SYR 30ML LL (SYRINGE) IMPLANT
TAPE TRANSPORE STRL 2 31045 (GAUZE/BANDAGES/DRESSINGS) IMPLANT
WATER STERILE IRR 500ML POUR (IV SOLUTION) IMPLANT

## 2024-02-08 NOTE — Anesthesia Preprocedure Evaluation (Signed)
 Anesthesia Evaluation  Patient identified by MRN, date of birth, ID band Patient awake    Reviewed: Allergy & Precautions, H&P , NPO status , Patient's Chart, lab work & pertinent test results, reviewed documented beta blocker date and time   Airway Mallampati: II  TM Distance: >3 FB Neck ROM: full    Dental no notable dental hx.    Pulmonary neg pulmonary ROS   Pulmonary exam normal breath sounds clear to auscultation       Cardiovascular Exercise Tolerance: Good hypertension,  Rhythm:regular Rate:Normal     Neuro/Psych  Neuromuscular disease  negative psych ROS   GI/Hepatic negative GI ROS, Neg liver ROS,,,  Endo/Other  diabetes    Renal/GU negative Renal ROS  negative genitourinary   Musculoskeletal   Abdominal   Peds  Hematology negative hematology ROS (+)   Anesthesia Other Findings   Reproductive/Obstetrics negative OB ROS                              Anesthesia Physical Anesthesia Plan  ASA: 2 and emergent  Anesthesia Plan: General and General ETT   Post-op Pain Management:    Induction:   PONV Risk Score and Plan: Ondansetron   Airway Management Planned:   Additional Equipment:   Intra-op Plan:   Post-operative Plan:   Informed Consent: I have reviewed the patients History and Physical, chart, labs and discussed the procedure including the risks, benefits and alternatives for the proposed anesthesia with the patient or authorized representative who has indicated his/her understanding and acceptance.     Dental Advisory Given  Plan Discussed with: CRNA  Anesthesia Plan Comments:         Anesthesia Quick Evaluation

## 2024-02-08 NOTE — Op Note (Signed)
 Patient:  Anthony Austin  DOB:  05-Apr-1961  MRN:  [id number]   Preop Diagnosis: Acute appendicitis  Postop Diagnosis: Same  Procedure: Robotic assisted laparoscopic appendectomy  Surgeon: [name], MD  Anes: General endotracheal  Indications: Patient is a 63 year old white male who presents with right lower quadrant abdominal pain.  CT scan of the abdomen reveals acute appendicitis.  The risks and benefits of the procedure including bleeding, infection, and the possibility of an open procedure were fully explained to the patient, who gave informed consent.  Procedure note: The patient was placed in the supine position.  After induction of general endotracheal anesthesia, the abdomen was prepped and draped using the usual sterile technique with ChloraPrep.  Surgical site confirmation was performed.  An incision was made in the left upper quadrant at Palmer's point.  A Veress needle was introduced into the abdominal cavity and confirmation of placement was done using the saline drop test.  The abdomen was then insufflated to 15 mmHg pressure.  An 8 mm trocar was introduced into the abdominal cavity under direct visualization without difficulty.  Additional 8 mm trocars were placed in the left flank and left lower quadrant regions.  The robot was then docked and targeted.  The appendix was noted to be very elongated and adhesed down to the pelvic floor.  There was a small amount of purulent fluid present.  I was able to find the base of the appendix and a white load GIA stapler was placed across the base of the appendix and fired.  I then was able to dissect along the appendix down to the tip.  There was no evidence of perforation.  The appendix was removed using an Endo Catch bag without difficulty.  The right lower quadrant was copiously irrigated with normal saline.  The staple line was inspected and noted to be within normal limits.  The bowel was inspected and no evidence of serosal tears were  noted.  The robot was undocked and all air and fluid were evacuated from the abdominal cavity prior to removal of the trocars.  All wounds were irrigated with normal saline.  All wounds were injected with 0.5% Sensorcaine .  All incisions were closed using a 4-0 Monocryl subcuticular suture.  Dermabond was applied.  All tape and needle counts were correct at the end of the procedure.  The patient was extubated in the operating room and transferred to PACU in stable condition.  Complications: None  EBL: Minimal  Specimen: Appendix

## 2024-02-08 NOTE — Interval H&P Note (Signed)
 History and Physical Interval Note:  02/08/2024 8:33 PM  Anthony Austin  has presented today for surgery, with the diagnosis of Acute appendicitis with localized peritonitis, without perforation or gangrene, unspecified whether abscess present [K35.30].  The various methods of treatment have been discussed with the patient and family. After consideration of risks, benefits and other options for treatment, the patient has consented to  Procedure(s): APPENDECTOMY, ROBOT-ASSISTED, LAPAROSCOPIC (N/A) as a surgical intervention.  The patient's history has been reviewed, patient examined, no change in status, stable for surgery.  I have reviewed the patient's chart and labs.  Questions were answered to the patient's satisfaction.     [name]

## 2024-02-08 NOTE — ED Triage Notes (Signed)
 Pt returns to the ED with abdominal pain after being seen last night for the same.

## 2024-02-08 NOTE — H&P (Signed)
 Anthony Austin is an 63 y.o. male.   Chief Complaint: Right lower quadrant abdominal pain HPI: Patient is a 63 year old white male who presented the emergency room with a 24-hour history of worsening right lower quadrant abdominal pain.  CT scan of the abdomen pelvis revealed acute uncomplicated appendicitis.  Patient did have an elevated white blood cell count as well as a lactic acid level.  His vital signs are stable.  Past Medical History:  Diagnosis Date   Diabetes mellitus without complication (HCC)    Heat exhaustion    Hyperlipidemia    Hypertension    Prostate CA (HCC) 2005    Past Surgical History:  Procedure Laterality Date   BACK SURGERY     COLONOSCOPY  07/31/2010   Dr. Debrah   PROSTATE SURGERY     PROSTATECTOMY  2005   PROSTATECTOMY      Family History  Problem Relation Age of Onset   Stroke Mother    Hypertension Mother    Diabetes Mother    Hypertension Father    Cancer Father    Colon cancer Neg Hx    Colon polyps Neg Hx    Esophageal cancer Neg Hx    Rectal cancer Neg Hx    Stomach cancer Neg Hx    Social History:  reports that he has never smoked. He has never used smokeless tobacco. He reports that he does not drink alcohol and does not use drugs.  Allergies: No Known Allergies  (Not in a hospital admission)   Results for orders placed or performed during the hospital encounter of 02/08/24 (from the past 48 hours)  Comprehensive metabolic panel     Status: Abnormal   Collection Time: 02/08/24  2:12 PM  Result Value Ref Range   Sodium 135 135 - 145 mmol/L   Potassium 3.2 (L) 3.5 - 5.1 mmol/L   Chloride 97 (L) 98 - 111 mmol/L   CO2 26 22 - 32 mmol/L   Glucose, Bld 176 (H) 70 - 99 mg/dL    Comment: Glucose reference range applies only to samples taken after fasting for at least 8 hours.   BUN 15 8 - 23 mg/dL   Creatinine, Ser 8.98 0.61 - 1.24 mg/dL   Calcium  9.0 8.9 - 10.3 mg/dL   Total Protein 7.2 6.5 - 8.1 g/dL   Albumin 3.9 3.5 - 5.0 g/dL    AST 20 15 - 41 U/L   ALT 26 0 - 44 U/L   Alkaline Phosphatase 68 38 - 126 U/L   Total Bilirubin 2.0 (H) 0.0 - 1.2 mg/dL   GFR, Estimated >39 >39 mL/min    Comment: (NOTE) Calculated using the CKD-EPI Creatinine Equation (2021)    Anion gap 12 5 - 15    Comment: Performed at [facility], [address] [place], [place] [address]  Lactic acid, plasma     Status: Abnormal   Collection Time: 02/08/24  2:12 PM  Result Value Ref Range   Lactic Acid, Venous 2.0 (HH) 0.5 - 1.9 mmol/L    Comment: CRITICAL RESULT CALLED TO, READ BACK BY AND VERIFIED WITH DELENA COUP RN [phone] MARLA CAROLIN Performed at [facility], [address], [place], [place] [address]   CBC with Differential     Status: Abnormal   Collection Time: 02/08/24  2:12 PM  Result Value Ref Range   WBC 17.5 (H) 4.0 - 10.5 K/uL   RBC 4.53 4.22 - 5.81 MIL/uL   Hemoglobin 14.9 13.0 -  17.0 g/dL   HCT 57.4 60.9 - 47.9 %   MCV 93.8 80.0 - 100.0 fL   MCH 32.9 26.0 - 34.0 pg   MCHC 35.1 30.0 - 36.0 g/dL   RDW 87.1 88.4 - 84.4 %   Platelets 261 150 - 400 K/uL   nRBC 0.0 0.0 - 0.2 %   Neutrophils Relative % 86 %   Neutro Abs 15.0 (H) 1.7 - 7.7 K/uL   Lymphocytes Relative 8 %   Lymphs Abs 1.4 0.7 - 4.0 K/uL   Monocytes Relative 5 %   Monocytes Absolute 0.9 0.1 - 1.0 K/uL   Eosinophils Relative 1 %   Eosinophils Absolute 0.2 0.0 - 0.5 K/uL   Basophils Relative 0 %   Basophils Absolute 0.1 0.0 - 0.1 K/uL   Immature Granulocytes 0 %   Abs Immature Granulocytes 0.07 0.00 - 0.07 K/uL    Comment: Performed at [facility], [address] [place], [place] [address]  Protime-INR     Status: None   Collection Time: 02/08/24  2:12 PM  Result Value Ref Range   Prothrombin Time 14.5 11.4 - 15.2 seconds   INR 1.1 0.8 - 1.2    Comment: (NOTE) INR goal varies based on device and disease states. Performed at [facility], [address], [place], [place] [address]   Culture, blood (Routine x 2)     Status: None (Preliminary result)    Collection Time: 02/08/24  2:12 PM   Specimen: Left Antecubital; Blood  Result Value Ref Range   Specimen Description      LEFT ANTECUBITAL BOTTLES DRAWN AEROBIC AND ANAEROBIC   Special Requests      Blood Culture adequate volume Performed at [facility], [address], [place], [place] [address]    Culture PENDING    Report Status PENDING   Culture, blood (Routine x 2)     Status: None (Preliminary result)   Collection Time: 02/08/24  2:35 PM   Specimen: Blood  Result Value Ref Range   Specimen Description BLOOD LEFT HAND BOTTLES DRAWN AEROBIC ONLY    Special Requests      Blood Culture results may not be optimal due to an inadequate volume of blood received in culture bottles Performed at [facility], [address], [place], [place] [address]    Culture PENDING    Report Status PENDING   Resp panel by RT-PCR (RSV, Flu A&B, Covid) Anterior Nasal Swab     Status: None   Collection Time: 02/08/24  2:36 PM   Specimen: Anterior Nasal Swab  Result Value Ref Range   SARS Coronavirus 2 by RT PCR NEGATIVE NEGATIVE    Comment: (NOTE) SARS-CoV-2 target nucleic acids are NOT DETECTED.  The SARS-CoV-2 RNA is generally detectable in upper respiratory specimens during the acute phase of infection. The lowest concentration of SARS-CoV-2 viral copies this assay can detect is 138 copies/mL. A negative result does not preclude SARS-Cov-2 infection and should not be used as the sole basis for treatment or other patient management decisions. A negative result may occur with  improper specimen collection/handling, submission of specimen other than nasopharyngeal swab, presence of viral mutation(s) within the areas targeted by this assay, and inadequate number of viral copies(<138 copies/mL). A negative result must be combined with clinical observations, patient history, and epidemiological information. The expected result is Negative.  Fact Sheet for Patients:   BloggerCourse.com  Fact Sheet for Healthcare Providers:  SeriousBroker.it  This test is no t yet approved  or cleared by the [place]  FDA and  has been authorized for detection and/or diagnosis of SARS-CoV-2 by FDA under an Emergency Use Authorization (EUA). This EUA will remain  in effect (meaning this test can be used) for the duration of the COVID-19 declaration under Section 564(b)(1) of the Act, 21 U.S.C.section 360bbb-3(b)(1), unless the authorization is terminated  or revoked sooner.       Influenza A by PCR NEGATIVE NEGATIVE   Influenza B by PCR NEGATIVE NEGATIVE    Comment: (NOTE) The Xpert Xpress SARS-CoV-2/FLU/RSV plus assay is intended as an aid in the diagnosis of influenza from Nasopharyngeal swab specimens and should not be used as a sole basis for treatment. Nasal washings and aspirates are unacceptable for Xpert Xpress SARS-CoV-2/FLU/RSV testing.  Fact Sheet for Patients: BloggerCourse.com  Fact Sheet for Healthcare Providers: SeriousBroker.it  This test is not yet approved or cleared by the [place]  FDA and has been authorized for detection and/or diagnosis of SARS-CoV-2 by FDA under an Emergency Use Authorization (EUA). This EUA will remain in effect (meaning this test can be used) for the duration of the COVID-19 declaration under Section 564(b)(1) of the Act, 21 U.S.C. section 360bbb-3(b)(1), unless the authorization is terminated or revoked.     Resp Syncytial Virus by PCR NEGATIVE NEGATIVE    Comment: (NOTE) Fact Sheet for Patients: BloggerCourse.com  Fact Sheet for Healthcare Providers: SeriousBroker.it  This test is not yet approved or cleared by the [place]  FDA and has been authorized for detection and/or diagnosis of SARS-CoV-2 by FDA under an Emergency Use Authorization (EUA).  This EUA will remain in effect (meaning this test can be used) for the duration of the COVID-19 declaration under Section 564(b)(1) of the Act, 21 U.S.C. section 360bbb-3(b)(1), unless the authorization is terminated or revoked.  Performed at [facility], [address] [place], [place] [address]   Lactic acid, plasma     Status: Abnormal   Collection Time: 02/08/24  4:17 PM  Result Value Ref Range   Lactic Acid, Venous 3.2 (HH) 0.5 - 1.9 mmol/L    Comment: CRITICAL RESULT CALLED TO, READ BACK BY AND VERIFIED WITH FLETCHER A. AT 1653 ON 02/08/2024 BY THOMPSON S. Performed at [facility], [address], [place], [place] [address]   Urinalysis, w/ Reflex to Culture (Infection Suspected) -Urine, Clean Catch     Status: Abnormal   Collection Time: 02/08/24  5:15 PM  Result Value Ref Range   Specimen Source URINE, CATHETERIZED    Color, Urine YELLOW YELLOW   APPearance CLEAR CLEAR   Specific Gravity, Urine 1.031 (H) 1.005 - 1.030   pH 5.0 5.0 - 8.0   Glucose, UA 50 (A) NEGATIVE mg/dL   Hgb urine dipstick NEGATIVE NEGATIVE   Bilirubin Urine NEGATIVE NEGATIVE   Ketones, ur 5 (A) NEGATIVE mg/dL   Protein, ur NEGATIVE NEGATIVE mg/dL   Nitrite NEGATIVE NEGATIVE   Leukocytes,Ua NEGATIVE NEGATIVE   RBC / HPF 0-5 0 - 5 RBC/hpf   WBC, UA 0-5 0 - 5 WBC/hpf    Comment:        Reflex urine culture not performed if WBC <=10, OR if Squamous epithelial cells >5. If Squamous epithelial cells >5 suggest recollection.    Bacteria, UA NONE SEEN NONE SEEN   Squamous Epithelial / HPF 0-5 0 - 5 /HPF   Mucus PRESENT    Hyaline Casts, UA PRESENT     Comment: Performed at [facility], [address], [place], [place] [address]  CT ABDOMEN PELVIS W CONTRAST Result Date: 02/08/2024 CLINICAL DATA:  periumbilical abdominal pain EXAM: CT ABDOMEN AND PELVIS WITH CONTRAST TECHNIQUE: Multidetector CT imaging of the abdomen and pelvis was performed using the standard protocol following bolus administration  of intravenous contrast. RADIATION DOSE REDUCTION: This exam was performed according to the departmental dose-optimization program which includes automated exposure control, adjustment of the mA and/or kV according to patient size and/or use of iterative reconstruction technique. CONTRAST:  OMNIPAQUE  IOHEXOL  300 MG/ML  SOLN COMPARISON:  March 17, 2022 FINDINGS: Lower chest: No focal airspace consolidation or pleural effusion.Multifocal subsegmental atelectasis in the lung bases. Coronary artery atherosclerosis. Hepatobiliary: No mass.No radiopaque stones or Goble thickening of the gallbladder. No intrahepatic or extrahepatic biliary ductal dilation. The portal veins are patent. Pancreas: No mass or main ductal dilation. No peripancreatic inflammation or fluid collection. Spleen: Normal size. No mass. Adrenals/Urinary Tract: No adrenal masses. No renal mass. No nephrolithiasis or hydronephrosis. The urinary bladder is distended without focal abnormality. Stomach/Bowel: Small hiatal hernia. The stomach is decompressed without focal abnormality. Multiple small duodenum diverticula, including a 1.5 cm periampullary diverticulum. No small bowel Viviani thickening or inflammation. No small bowel obstruction.Dilated and fluid-filled retrocecal appendix with adjacent inflammation. Descending and sigmoid colonic diverticulosis. No changes of acute diverticulitis. Vascular/Lymphatic: No aortic aneurysm. Diffuse aortoiliac atherosclerosis. No intraabdominal or pelvic lymphadenopathy. Reproductive: Prostatectomy changes.No free pelvic fluid. Other: No pneumoperitoneum, ascites, or mesenteric inflammation. Musculoskeletal: No acute fracture or destructive lesion. Multilevel degenerative disc disease of the spine. IMPRESSION: 1. Acute, uncomplicated appendicitis. 2. Duodenum and colonic diverticulosis. No changes of acute diverticulitis. Aortic Atherosclerosis (ICD10-I70.0). Electronically Signed   By: Rogelia Myers M.D.    On: 02/08/2024 16:45   DG Chest Port 1 View if patient is in a treatment room. Result Date: 02/08/2024 CLINICAL DATA:  Suspected sepsis. EXAM: PORTABLE CHEST 1 VIEW COMPARISON:  Chest radiograph dated 03/16/2022. FINDINGS: Shallow inspiration. No focal consolidation, pleural effusion, pneumothorax. The cardiac silhouette is within normal limits. No acute osseous pathology. IMPRESSION: No active disease. Electronically Signed   By: Vanetta Chou M.D.   On: 02/08/2024 14:19    Review of Systems  Constitutional: Negative.   HENT: Negative.    Eyes: Negative.   Respiratory: Negative.    Cardiovascular: Negative.   Gastrointestinal:  Positive for abdominal pain.  Endocrine: Negative.   Genitourinary: Negative.   Musculoskeletal:  Positive for back pain.  Allergic/Immunologic: Negative.   Neurological: Negative.   Hematological: Negative.   Psychiatric/Behavioral: Negative.      Blood pressure 116/80, pulse 91, temperature (!) 101 F (38.3 C), temperature source Oral, resp. rate (!) 27, height 5' 7 (1.702 m), weight 83.9 kg, SpO2 93%. Physical Exam Vitals reviewed.  Constitutional:      Appearance: He is well-developed. He is not ill-appearing.  HENT:     Head: Normocephalic and atraumatic.  Cardiovascular:     Rate and Rhythm: Normal rate and regular rhythm.     Heart sounds: Normal heart sounds. No murmur heard.    No friction rub. No gallop.  Pulmonary:     Effort: Pulmonary effort is normal. No respiratory distress.     Breath sounds: Normal breath sounds. No stridor. No wheezing, rhonchi or rales.  Abdominal:     General: Bowel sounds are normal.     Palpations: Abdomen is soft.     Tenderness: There is abdominal tenderness in the right lower quadrant.     Hernia: No hernia is present.  Skin:  General: Skin is warm and dry.  Neurological:     Mental Status: He is alert and oriented to person, place, and time.     CT scan images personally  reviewed Assessment/Plan Impression: Acute uncomplicated appendicitis Plan: Patient will be taken to the operating room for robotic assisted laparoscopic appendectomy.  The risks and benefits of the procedure including bleeding, infection, and the possibility of an open procedure were fully explained to the patient, who gave informed consent.  [name], MD 02/08/2024, 8:29 PM

## 2024-02-08 NOTE — ED Provider Notes (Signed)
 [facility] EMERGENCY DEPARTMENT AT [facility] Provider Note   CSN: [id number] Arrival date & time: 02/08/24  1337     Patient presents with: Abdominal Pain   Anthony Austin is a 63 y.o. male who presents to the emergency department with a chief complaint of abdominal pain, fever, chills, weakness.  Patient was recently seen in the emergency department yesterday evening and discharged with suspected early diverticulitis with an outpatient course of Augmentin .  At this time patient states that he had been afebrile, did not have chills, and did not feel weak.  He states that since leaving the hospital his symptoms worsened, he states that he did take the 1 dose of Augmentin  this morning but has not taken anything for fever.  He states that he did take 1 oxycodone  at home for pain.  Denies chest pain or shortness of breath.  Patient does appreciate some nausea but denies vomiting.  Denies urinary symptoms.  Patient states that 2 to 3 days ago he did have 2 episodes of diarrhea that was nonbloody, however states that this is not all that abnormal for him.  Past medical history significant for hypertension, diabetes, fatty liver, hyperlipidemia, prostate cancer, back surgery, etc.     Abdominal Pain      Prior to Admission medications   Medication Sig Start Date End Date Taking? Authorizing Provider  amoxicillin -clavulanate (AUGMENTIN ) 875-125 MG tablet Take 1 tablet by mouth every 12 (twelve) hours. 02/08/24  Yes Midge Golas, MD  atorvastatin  (LIPITOR) 40 MG tablet Take 1 tablet (40 mg total) by mouth daily. For cholesterol 05/05/23  Yes Stacks, Butler, MD  cephALEXin (KEFLEX) 500 MG capsule Take 500 mg by mouth 2 (two) times daily. 02/05/24 02/12/24 Yes [provider]  gabapentin  (NEURONTIN ) 600 MG tablet Take 600 mg by mouth 3 (three) times daily. 02/02/24  Yes [provider]  hydrochlorothiazide  (HYDRODIURIL ) 25 MG tablet Take 0.5 tablets (12.5 mg total) by mouth  daily. Patient taking differently: Take 25 mg by mouth daily. 11/30/23  Yes Stacks, Butler, MD  lisinopril  (ZESTRIL ) 40 MG tablet Take 1 tablet (40 mg total) by mouth daily. One tablet or half depending on BP 11/30/23  Yes Stacks, Butler, MD  mupirocin  ointment (BACTROBAN ) 2 % Apply 1 Application topically 2 (two) times daily. 02/05/24 02/12/24 Yes [provider]  oxyCODONE -acetaminophen  (PERCOCET/ROXICET) 5-325 MG tablet Take 1 tablet by mouth every 6 (six) hours as needed for severe pain (pain score 7-10). 11/19/23  Yes Martin, Mary-Margaret, FNP  Semaglutide , 1 MG/DOSE, (OZEMPIC , 1 MG/DOSE,) 4 MG/3ML SOPN Inject 1 mg into the skin once a week. Patient taking differently: Inject 1 mg into the skin every Sunday. 11/02/23  Yes Stacks, Butler, MD  triamcinolone cream (KENALOG) 0.1 % Apply 1 Application topically 2 (two) times daily. 02/05/24 02/04/25 Yes [provider]    Allergies: Patient has no known allergies.    Review of Systems  Gastrointestinal:  Positive for abdominal pain.    Updated Vital Signs BP 129/85 (BP Location: Right Arm)   Pulse (!) 101   Temp 100.1 F (37.8 C) (Oral)   Resp 18   Ht 5' 7 (1.702 m)   Wt 83.9 kg   SpO2 92%   BMI 28.97 kg/m   Physical Exam Vitals and nursing note reviewed.  Constitutional:      General: He is awake. He is not in acute distress.    Appearance: He is not toxic-appearing or diaphoretic.     Comments: Patient  on 2L of O2 via [place]  HENT:     Head: Normocephalic and atraumatic.  Eyes:     General: No scleral icterus. Cardiovascular:     Rate and Rhythm: Regular rhythm. Tachycardia present.  Pulmonary:     Effort: Pulmonary effort is normal. No respiratory distress.     Breath sounds: No wheezing, rhonchi or rales.  Abdominal:     General: Abdomen is flat. There is no distension.     Palpations: Abdomen is soft.     Tenderness: There is abdominal tenderness in the periumbilical area and suprapubic area. There is no  right CVA tenderness, left CVA tenderness, guarding or rebound.  Skin:    General: Skin is warm.     Capillary Refill: Capillary refill takes less than 2 seconds.  Neurological:     General: No focal deficit present.     Mental Status: He is alert and oriented to person, place, and time.  Psychiatric:        Mood and Affect: Mood normal.        Behavior: Behavior normal. Behavior is cooperative.     (all labs ordered are listed, but only abnormal results are displayed) Labs Reviewed  COMPREHENSIVE METABOLIC PANEL WITH GFR - Abnormal; Notable for the following components:      Result Value   Potassium 3.2 (*)    Chloride 97 (*)    Glucose, Bld 176 (*)    Total Bilirubin 2.0 (*)    All other components within normal limits  LACTIC ACID, PLASMA - Abnormal; Notable for the following components:   Lactic Acid, Venous 2.0 (*)    All other components within normal limits  LACTIC ACID, PLASMA - Abnormal; Notable for the following components:   Lactic Acid, Venous 3.2 (*)    All other components within normal limits  CBC WITH DIFFERENTIAL/PLATELET - Abnormal; Notable for the following components:   WBC 17.5 (*)    Neutro Abs 15.0 (*)    All other components within normal limits  URINALYSIS, W/ REFLEX TO CULTURE (INFECTION SUSPECTED) - Abnormal; Notable for the following components:   Specific Gravity, Urine 1.031 (*)    Glucose, UA 50 (*)    Ketones, ur 5 (*)    All other components within normal limits  GLUCOSE, CAPILLARY - Abnormal; Notable for the following components:   Glucose-Capillary 131 (*)    All other components within normal limits  CULTURE, BLOOD (ROUTINE X 2)  CULTURE, BLOOD (ROUTINE X 2)  RESP PANEL BY RT-PCR (RSV, FLU A&B, COVID)  RVPGX2  PROTIME-INR  CBC  BASIC METABOLIC PANEL WITH GFR  SURGICAL PATHOLOGY    EKG: None  Radiology: CT ABDOMEN PELVIS W CONTRAST Result Date: 02/08/2024 CLINICAL DATA:  periumbilical abdominal pain EXAM: CT ABDOMEN AND PELVIS  WITH CONTRAST TECHNIQUE: Multidetector CT imaging of the abdomen and pelvis was performed using the standard protocol following bolus administration of intravenous contrast. RADIATION DOSE REDUCTION: This exam was performed according to the departmental dose-optimization program which includes automated exposure control, adjustment of the mA and/or kV according to patient size and/or use of iterative reconstruction technique. CONTRAST:  OMNIPAQUE  IOHEXOL  300 MG/ML  SOLN COMPARISON:  March 17, 2022 FINDINGS: Lower chest: No focal airspace consolidation or pleural effusion.Multifocal subsegmental atelectasis in the lung bases. Coronary artery atherosclerosis. Hepatobiliary: No mass.No radiopaque stones or Ibbotson thickening of the gallbladder. No intrahepatic or extrahepatic biliary ductal dilation. The portal veins are patent. Pancreas: No mass or main ductal dilation. No  peripancreatic inflammation or fluid collection. Spleen: Normal size. No mass. Adrenals/Urinary Tract: No adrenal masses. No renal mass. No nephrolithiasis or hydronephrosis. The urinary bladder is distended without focal abnormality. Stomach/Bowel: Small hiatal hernia. The stomach is decompressed without focal abnormality. Multiple small duodenum diverticula, including a 1.5 cm periampullary diverticulum. No small bowel Clere thickening or inflammation. No small bowel obstruction.Dilated and fluid-filled retrocecal appendix with adjacent inflammation. Descending and sigmoid colonic diverticulosis. No changes of acute diverticulitis. Vascular/Lymphatic: No aortic aneurysm. Diffuse aortoiliac atherosclerosis. No intraabdominal or pelvic lymphadenopathy. Reproductive: Prostatectomy changes.No free pelvic fluid. Other: No pneumoperitoneum, ascites, or mesenteric inflammation. Musculoskeletal: No acute fracture or destructive lesion. Multilevel degenerative disc disease of the spine. IMPRESSION: 1. Acute, uncomplicated appendicitis. 2. Duodenum  and colonic diverticulosis. No changes of acute diverticulitis. Aortic Atherosclerosis (ICD10-I70.0). Electronically Signed   By: Rogelia Myers M.D.   On: 02/08/2024 16:45   DG Chest Port 1 View if patient is in a treatment room. Result Date: 02/08/2024 CLINICAL DATA:  Suspected sepsis. EXAM: PORTABLE CHEST 1 VIEW COMPARISON:  Chest radiograph dated 03/16/2022. FINDINGS: Shallow inspiration. No focal consolidation, pleural effusion, pneumothorax. The cardiac silhouette is within normal limits. No acute osseous pathology. IMPRESSION: No active disease. Electronically Signed   By: Vanetta Chou M.D.   On: 02/08/2024 14:19     .Critical Care  Performed by: Janetta Terrall FALCON, PA-C Authorized by: Janetta Terrall FALCON, PA-C   Critical care provider statement:    Critical care time (minutes):  30   Critical care was necessary to treat or prevent imminent or life-threatening deterioration of the following conditions:  Sepsis   Critical care was time spent personally by me on the following activities:  Development of treatment plan with patient or surrogate, discussions with consultants, evaluation of patient's response to treatment, examination of patient, obtaining history from patient or surrogate, ordering and performing treatments and interventions, ordering and review of laboratory studies, ordering and review of radiographic studies, pulse oximetry, re-evaluation of patient's condition and review of old charts   Care discussed with: admitting provider      Medications Ordered in the ED  atorvastatin  (LIPITOR) tablet 40 mg (has no administration in time range)  gabapentin  (NEURONTIN ) capsule 600 mg (600 mg Oral Given 02/09/24 0016)  hydrochlorothiazide  (HYDRODIURIL ) tablet 25 mg (has no administration in time range)  lisinopril  (ZESTRIL ) tablet 40 mg (has no administration in time range)  mupirocin  ointment (BACTROBAN ) 2 % 1 Application (has no administration in time range)  enoxaparin   (LOVENOX ) injection 40 mg (has no administration in time range)  0.9 %  sodium chloride  infusion ( Intravenous New Bag/Given 02/08/24 2344)  cefTRIAXone  (ROCEPHIN ) 2 g in sodium chloride  0.9 % 100 mL IVPB (has no administration in time range)    And  metroNIDAZOLE  (FLAGYL ) IVPB 500 mg (has no administration in time range)  acetaminophen  (TYLENOL ) tablet 650 mg (650 mg Oral Given 02/09/24 0017)    Or  acetaminophen  (TYLENOL ) suppository 650 mg ( Rectal See Alternative 02/09/24 0017)  ketorolac  (TORADOL ) 30 MG/ML injection 30 mg (30 mg Intravenous Given 02/09/24 0016)    Followed by  ketorolac  (TORADOL ) 30 MG/ML injection 30 mg (has no administration in time range)  oxyCODONE  (Oxy IR/ROXICODONE ) immediate release tablet 5-10 mg (has no administration in time range)  HYDROmorphone  (DILAUDID ) injection 1 mg (has no administration in time range)  ondansetron  (ZOFRAN -ODT) disintegrating tablet 4 mg (has no administration in time range)    Or  ondansetron  (ZOFRAN ) injection 4 mg (has no administration  in time range)  simethicone  (MYLICON) chewable tablet 40 mg (has no administration in time range)  lactated ringers  bolus 1,000 mL (0 mLs Intravenous Stopped 02/08/24 1702)    And  lactated ringers  bolus 1,000 mL (0 mLs Intravenous Stopped 02/08/24 1702)    And  lactated ringers  bolus 1,000 mL (0 mLs Intravenous Stopped 02/08/24 1702)  cefTRIAXone  (ROCEPHIN ) 2 g in sodium chloride  0.9 % 100 mL IVPB (0 g Intravenous Stopped 02/08/24 1524)  metroNIDAZOLE  (FLAGYL ) IVPB 500 mg (0 mg Intravenous Stopped 02/08/24 1641)  acetaminophen  (TYLENOL ) tablet 650 mg (650 mg Oral Given 02/08/24 1444)  iohexol  (OMNIPAQUE ) 300 MG/ML solution 100 mL (100 mLs Intravenous Contrast Given 02/08/24 1555)  HYDROmorphone  (DILAUDID ) injection 0.5 mg (0.5 mg Intravenous Given 02/08/24 1536)  0.9 %  sodium chloride  infusion (0 mLs Intravenous Stopped 02/08/24 2032)  HYDROmorphone  (DILAUDID ) injection 0.5 mg (0.5 mg Intravenous Given  02/08/24 1804)    Clinical Course as of 02/09/24 0027  Tue Feb 08, 2024  1458 Seen yesterday and diverticulitis expected, vitals stable at that time and not systemic infection, now returns with systemic symptoms, sepsis orders placed [CH]  1741 Prob be around appendectomy.  [TL]    Clinical Course User Index [CH] Antavia Tandy F, PA-C [TL] Simon Lavonia SAILOR, MD                                 Medical Decision Making Amount and/or Complexity of Data Reviewed Labs: ordered. Radiology: ordered.  Risk OTC drugs. Prescription drug management. Decision regarding hospitalization.   Patient presents to the ED for concern of abdominal pain, fever this involves an extensive number of treatment options, and is a complaint that carries with it a high risk of complications and morbidity.  The differential diagnosis includes diverticulitis, appendicitis, pancreatitis, cholecystitis, cholangitis, gastritis, small bowel obstruction, etc   Co morbidities that complicate the patient evaluation  diverticulitis, appendicitis, pancreatitis, cholecystitis, cholangitis, gastritis, small bowel obstruction, etc   Additional history obtained:  Due to emergency department note from 02/07/2024 where patient was symptomatically treated and shared decision making was used at this time to avoid CT scan, patient was treated for what was thought to be early diverticulitis with outpatient course of antibiotics   Lab Tests:  I Ordered, and personally interpreted labs.  The pertinent results include: CBC showed elevated white blood cell count of 17-1/2, CMP showed slightly elevated total bilirubin at 2, elevated glucose at 176, slightly decreased potassium at 3.2, lactic acid noted to be 2 and then 3.2 on redraw urinalysis showed 5 ketones, blood cultures pending   Imaging Studies ordered:  I ordered imaging studies including CT abdomen pelvis with contrast, chest x-ray I independently visualized and  interpreted imaging which showed: CT abdomen pelvis with contrast: Acute uncomplicated appendicitis, diverticulosis but no sign of diverticulitis Chest x-ray: No evidence of pneumonia or pneumothorax, no cardiopulmonary abnormality I agree with the radiologist interpretation   Cardiac Monitoring:  The patient was maintained on a cardiac monitor.  I personally viewed and interpreted the cardiac monitored which showed an underlying rhythm of: Sinus rhythm   Medicines ordered and prescription drug management:  I ordered medication including Tylenol  for fever and pain, ceftriaxone  and Flagyl  for suspected intra-abdominal bacterial infection, fluids for dehydration and sepsis, Dilaudid  for pain  Reevaluation of the patient after these medicines showed that the patient stayed the same I have reviewed the patients home medicines and have made  adjustments as needed   Test Considered:  None   Critical Interventions:  Sepsis orders    Consultations Obtained:  I requested consultation with the general surgery team,  and discussed lab and imaging findings as well as pertinent plan - they recommend: Appendectomy later today at approximately 9 PM, they will admit to their service   Problem List / ED Course:  63 year old male recently seen in the emergency department yesterday, improved with symptomatic treatment, now back again with systemic symptoms, patient met possible sepsis criteria in triage and sepsis orders were placed Vital signs significant for temperature of 101.3, pulse rate 115, patient also appreciates chills as well as his abdominal pain On physical exam patient not in acute distress, currently on 2 L via nasal cannula which he states he does not wear at home however states that he is not truly short of breath and is not having any chest pain, periumbilical and suprapubic tenderness noted on exam Sepsis lab orders placed as well as fluids and IV antibiotics along with Tylenol   for fever CT abdomen pelvis ordered CT scan significant for acute uncomplicated appendicitis, no sign of diverticulitis General surgery consulted (Dr. Mavis) he who recommends appendectomy later tonight, surgery will admit to their service Patient taken to the operating room Most likely diagnosis at this time is sepsis due to acute uncomplicated appendicitis, patient was given multiple liters of fluid here in the emergency department as well as early antibiotics, care turned over to surgery team for ongoing diagnosis and treatment   Reevaluation:  After the interventions noted above, I reevaluated the patient and found that they have :stayed the same   Social Determinants of Health:  none   Dispostion:  After consideration of the diagnostic results and the patients response to treatment, I feel that the patent would benefit from intervention and ongoing diagnosis and treatment by the general surgery team.      Final diagnoses:  Acute appendicitis, unspecified acute appendicitis type  Periumbilical abdominal pain  Fever, unspecified fever cause    ED Discharge Orders     None          Janetta Terrall FALCON, PA-C 02/09/24 0029    Franklyn Sid SAILOR, MD 02/09/24 [id number]

## 2024-02-08 NOTE — Transfer of Care (Signed)
 Immediate Anesthesia Transfer of Care Note  Patient: Anthony Austin  Procedure(s) Performed: APPENDECTOMY, ROBOT-ASSISTED, LAPAROSCOPIC  Patient Location: PACU  Anesthesia Type:General  Level of Consciousness: awake and alert   Airway & Oxygen Therapy: Patient Spontanous Breathing  Post-op Assessment: Report given to RN and Post -op Vital signs reviewed and stable  Post vital signs: Reviewed and stable  Last Vitals:  Vitals Value Taken Time  BP 112/75 02/08/24 23:00  Temp 38.1 C 02/08/24 22:47  Pulse 99 02/08/24 23:02  Resp 30 02/08/24 23:02  SpO2 91 % 02/08/24 23:02  Vitals shown include unfiled device data.  Last Pain:  Vitals:   02/08/24 2247  TempSrc:   PainSc: Asleep         Complications: No notable events documented.

## 2024-02-08 NOTE — ED Provider Notes (Signed)
 [place] EMERGENCY DEPARTMENT AT [facility] Provider Note   CSN: [id number] Arrival date & time: 02/07/24  2346     Patient presents with: Abdominal Pain   Anthony Austin is a 63 y.o. male.   The history is provided by the patient.  Patient w/ previous history of diabetes, hypertension presents with lower abdominal pain.  Patient reports since about 3 AM on August 18 he has been having lower abdominal pain.  It does not radiate.  No fevers or vomiting.  He does report two blowouts over the weekend that was nonbloody diarrhea but he gets that frequently No previous abdominal surgeries.  Patient reports history of diverticulitis a few years ago that responded to antibiotics.  He reports this pain feels similar to that episode Past Medical History:  Diagnosis Date   Diabetes mellitus without complication (HCC)    Heat exhaustion    Hyperlipidemia    Hypertension    Prostate CA (HCC) 2005    Prior to Admission medications   Medication Sig Start Date End Date Taking? Authorizing Provider  amoxicillin -clavulanate (AUGMENTIN ) 875-125 MG tablet Take 1 tablet by mouth every 12 (twelve) hours. 02/08/24  Yes Midge Golas, MD  atorvastatin  (LIPITOR) 40 MG tablet Take 1 tablet (40 mg total) by mouth daily. For cholesterol 05/05/23   Zollie Lowers, MD  fluocinonide  cream (LIDEX ) 0.05 % Apply 1 Application topically 2 (two) times daily. To the area of concern on the left forearm 10/20/23   Zollie Lowers, MD  gabapentin  (NEURONTIN ) 100 MG capsule Take 100 mg by mouth 3 (three) times daily. Tapering up. Currently taking twice daily. 11/25/23   [provider]  hydrochlorothiazide  (HYDRODIURIL ) 25 MG tablet Take 0.5 tablets (12.5 mg total) by mouth daily. 11/30/23   Zollie Lowers, MD  lisinopril  (ZESTRIL ) 40 MG tablet Take 1 tablet (40 mg total) by mouth daily. One tablet or half depending on BP 11/30/23   Zollie Lowers, MD  meclizine  (ANTIVERT ) 25 MG tablet Take 1 tablet (25  mg total) by mouth 3 (three) times daily as needed for dizziness. 03/29/23   Lavell Bari LABOR, FNP  oxyCODONE -acetaminophen  (PERCOCET/ROXICET) 5-325 MG tablet Take 1 tablet by mouth every 6 (six) hours as needed for severe pain (pain score 7-10). 11/19/23   Gladis, Mary-Margaret, FNP  Semaglutide , 1 MG/DOSE, (OZEMPIC , 1 MG/DOSE,) 4 MG/3ML SOPN Inject 1 mg into the skin once a week. 11/02/23   Zollie Lowers, MD  tiZANidine  (ZANAFLEX ) 4 MG tablet Take 1 tablet (4 mg total) by mouth every 6 (six) hours as needed for muscle spasms. 11/30/23   Zollie Lowers, MD    Allergies: Patient has no known allergies.    Review of Systems  Constitutional:  Negative for fever.  Cardiovascular:  Negative for chest pain.  Gastrointestinal:  Positive for abdominal pain and diarrhea. Negative for blood in stool and vomiting.  Genitourinary:  Negative for difficulty urinating, dysuria and testicular pain.    Updated Vital Signs BP 126/73   Pulse 84   Temp 98.4 F (36.9 C) (Oral)   Resp 18   Ht 1.702 m (5' 7)   Wt 83.9 kg   SpO2 90%   BMI 28.98 kg/m   Physical Exam CONSTITUTIONAL: Well developed/well nourished HEAD: Normocephalic/atraumatic ENMT: Mucous membranes moist NECK: supple no meningeal signs CV: S1/S2 noted, no murmurs/rubs/gallops noted LUNGS: Lungs are clear to auscultation bilaterally, no apparent distress ABDOMEN: soft, mild suprapubic tenderness, no rebound or guarding, bowel sounds noted throughout abdomen GU:no cva tenderness  NEURO: Pt is awake/alert/appropriate, moves all extremitiesx4.  No facial droop.   EXTREMITIES: pulses normal/equal in both feet, full ROM SKIN: warm, color normal PSYCH: no abnormalities of mood noted, alert and oriented to situation  (all labs ordered are listed, but only abnormal results are displayed) Labs Reviewed  CBC WITH DIFFERENTIAL/PLATELET - Abnormal; Notable for the following components:      Result Value   WBC 10.9 (*)    All other components  within normal limits  BASIC METABOLIC PANEL WITH GFR - Abnormal; Notable for the following components:   Potassium 3.2 (*)    Glucose, Bld 111 (*)    All other components within normal limits  URINALYSIS, ROUTINE W REFLEX MICROSCOPIC - Abnormal; Notable for the following components:   Color, Urine AMBER (*)    Specific Gravity, Urine 1.031 (*)    Protein, ur 30 (*)    All other components within normal limits    EKG: None  Radiology: No results found.   Procedures   Medications Ordered in the ED  ketorolac  (TORADOL ) 15 MG/ML injection 15 mg (15 mg Intravenous Given 02/08/24 0129)  cefTRIAXone  (ROCEPHIN ) 2 g in sodium chloride  0.9 % 100 mL IVPB (0 g Intravenous Stopped 02/08/24 0156)    And  metroNIDAZOLE  (FLAGYL ) IVPB 500 mg (0 mg Intravenous Stopped 02/08/24 0257)    Clinical Course as of 02/08/24 0306  Tue Feb 08, 2024  0104 WBC(!): 10.9 Mild leukocytosis [DW]  0207 Presents with lower abdominal pain that he feels consistent with prior diverticulitis.  He has focal lower abdominal tenderness, no pain in his groin.  Denies any testicular pain.  There is no other tenderness noted.  No rebound or guarding.  No peritoneal signs.  Strong suspicion this represents early diverticulitis given his history. [DW]  0207 Patient is feeling improved.  Will continue to monitor but at this point we will defer CT imaging [DW]  0304 Patient continues to improve, no vomiting overall in no acute distress We will start antibiotics for presumed diverticulitis.  We discussed strict return precautions [DW]    Clinical Course User Index [DW] Midge Golas, MD                                 Medical Decision Making Amount and/or Complexity of Data Reviewed Labs: ordered. Decision-making details documented in ED Course.  Risk Prescription drug management.   This patient presents to the ED for concern of abdominal pain, this involves an extensive number of treatment options, and is a  complaint that carries with it a high risk of complications and morbidity.  The differential diagnosis includes but is not limited to cholecystitis, cholelithiasis, pancreatitis, gastritis, peptic ulcer disease, appendicitis, bowel obstruction, bowel perforation, diverticulitis, AAA, ischemic bowel   Comorbidities that complicate the patient evaluation: Patient's presentation is complicated by their history of hypertension  Additional history obtained: Records reviewed outpatient records reviewed  Lab Tests: I Ordered, and personally interpreted labs.  The pertinent results include:  mild leukocytosis   Medicines ordered and prescription drug management: I ordered medication including toradol   for pain  Reevaluation of the patient after these medicines showed that the patient    improved  Test Considered: CT abdomen pelvis was considered, but since he is improving without any peritoneal signs, will defer at this time  Critical Interventions:   IV antibiotics  Reevaluation: After the interventions noted above, I reevaluated the patient and  found that they have :improved  Complexity of problems addressed: Patient's presentation is most consistent with  acute presentation with potential threat to life or bodily function  Disposition: After consideration of the diagnostic results and the patient's response to treatment,  I feel that the patent would benefit from discharge  .   At time of discharge, patient is awake alert no distress.  He did have mild drop in his pulse ox, but this was likely due to him sleeping     Final diagnoses:  Lower abdominal pain    ED Discharge Orders          Ordered    amoxicillin -clavulanate (AUGMENTIN ) 875-125 MG tablet  Every 12 hours        02/08/24 0208               Midge Golas, MD 02/08/24 [phone]

## 2024-02-08 NOTE — ED Notes (Signed)
 Pt transferred to  OR.

## 2024-02-09 ENCOUNTER — Encounter (HOSPITAL_COMMUNITY): Payer: Self-pay | Admitting: General Surgery

## 2024-02-09 LAB — CBC
HCT: 36.1 % — ABNORMAL LOW (ref 39.0–52.0)
Hemoglobin: 11.9 g/dL — ABNORMAL LOW (ref 13.0–17.0)
MCH: 32.4 pg (ref 26.0–34.0)
MCHC: 33 g/dL (ref 30.0–36.0)
MCV: 98.4 fL (ref 80.0–100.0)
Platelets: 187 K/uL (ref 150–400)
RBC: 3.67 MIL/uL — ABNORMAL LOW (ref 4.22–5.81)
RDW: 12.5 % (ref 11.5–15.5)
WBC: 13.9 K/uL — ABNORMAL HIGH (ref 4.0–10.5)
nRBC: 0 % (ref 0.0–0.2)

## 2024-02-09 LAB — BASIC METABOLIC PANEL WITH GFR
Anion gap: 10 (ref 5–15)
BUN: 13 mg/dL (ref 8–23)
CO2: 24 mmol/L (ref 22–32)
Calcium: 7.9 mg/dL — ABNORMAL LOW (ref 8.9–10.3)
Chloride: 102 mmol/L (ref 98–111)
Creatinine, Ser: 0.98 mg/dL (ref 0.61–1.24)
GFR, Estimated: >60 mL/min (ref >60)
Glucose, Bld: 120 mg/dL — ABNORMAL HIGH (ref 70–99)
Potassium: 3.2 mmol/L — ABNORMAL LOW (ref 3.5–5.1)
Sodium: 136 mmol/L (ref 135–145)

## 2024-02-09 LAB — GLUCOSE, CAPILLARY: Glucose-Capillary: 98 mg/dL (ref 70–99)

## 2024-02-09 MED ORDER — POTASSIUM CHLORIDE CRYS ER 20 MEQ PO TBCR: 20.0000 meq | EXTENDED_RELEASE_TABLET | Freq: Two times a day (BID) | ORAL | 0 refills | Status: AC

## 2024-02-09 MED ORDER — AMOXICILLIN-POT CLAVULANATE 875-125 MG PO TABS
1.0000 | ORAL_TABLET | Freq: Two times a day (BID) | ORAL | 0 refills | Status: DC
Start: 2024-02-09 — End: 2024-03-08

## 2024-02-09 NOTE — Progress Notes (Signed)
 Patient discharged home with instructions given on medications and follow up visits,patient verbalized understanding. Prescriptions sent to Pharmacy of choice documented on AVS.IV discontinued catheters intact.Accompanied by staff to an awaiting vehicle.

## 2024-02-09 NOTE — Progress Notes (Signed)
 Nurse at bedside,patient alert and oriented times four this morning.No c/o pain or discomfort noted. Bowel sounds present,and patient had a bowel movement this morning. Plan of care on going.

## 2024-02-09 NOTE — Discharge Summary (Signed)
 Physician Discharge Summary  Patient ID: Anthony Austin MRN: [id number] DOB/AGE: [birth year] 63 y.o.  Admit date: 02/08/2024 Discharge date: 02/09/2024  Admission Diagnoses: Acute appendicitis  Discharge Diagnoses:  Principal Problem:   S/P laparoscopic appendectomy Active Problems:   Acute appendicitis with localized peritonitis, without perforation, abscess, or gangrene   Discharged Condition: good  Hospital Course: Patient is a 63 year old white male who presented to the emergency room twice over the last 48 hours with worsening lower abdominal pain.  CT scan of the abdomen pelvis revealed acute appendicitis.  The patient was taken to the operating room on 02/08/2024 and underwent a robotic assisted laparoscopic appendectomy.  He tolerated the surgery well.  His postoperative course has been unremarkable.  His diet was advanced without difficulty.  His leukocytosis is slowly resolving.  The patient is being discharged home on 02/09/2024 in good and improving condition.  He will take Augmentin  875 mg twice a day for 5 days.  Treatments: surgery: Robotic assisted laparoscopic appendectomy on 02/08/2024  Discharge Exam: Blood pressure 116/80, pulse 92, temperature 99.9 F (37.7 C), temperature source Oral, resp. rate 16, height 5' 7 (1.702 m), weight 88.6 kg, SpO2 93%. General appearance: alert, cooperative, and no distress Resp: clear to auscultation bilaterally Cardio: regular rate and rhythm, S1, S2 normal, no murmur, click, rub or gallop GI: Soft, incisions healing well.  Disposition: Discharge disposition: 01-Home or Self Care       Discharge Instructions     Diet - low sodium heart healthy   Complete by: As directed    Increase activity slowly   Complete by: As directed       Allergies as of 02/09/2024   No Known Allergies      Medication List     STOP taking these medications    cephALEXin 500 MG capsule Commonly known as: KEFLEX       TAKE these  medications    amoxicillin -clavulanate 875-125 MG tablet Commonly known as: AUGMENTIN  Take 1 tablet by mouth 2 (two) times daily. What changed: when to take this   atorvastatin  40 MG tablet Commonly known as: LIPITOR Take 1 tablet (40 mg total) by mouth daily. For cholesterol   gabapentin  600 MG tablet Commonly known as: NEURONTIN  Take 600 mg by mouth 3 (three) times daily.   hydrochlorothiazide  25 MG tablet Commonly known as: HYDRODIURIL  Take 0.5 tablets (12.5 mg total) by mouth daily. What changed: how much to take   lisinopril  40 MG tablet Commonly known as: ZESTRIL  Take 1 tablet (40 mg total) by mouth daily. One tablet or half depending on BP   mupirocin  ointment 2 % Commonly known as: BACTROBAN  Apply 1 Application topically 2 (two) times daily.   oxyCODONE -acetaminophen  5-325 MG tablet Commonly known as: PERCOCET/ROXICET Take 1 tablet by mouth every 6 (six) hours as needed for severe pain (pain score 7-10).   Ozempic  (1 MG/DOSE) 4 MG/3ML Sopn Generic drug: Semaglutide  (1 MG/DOSE) Inject 1 mg into the skin once a week. What changed: when to take this   potassium chloride  SA 20 MEQ tablet Commonly known as: KLOR-CON  M Take 1 tablet (20 mEq total) by mouth 2 (two) times daily.   triamcinolone cream 0.1 % Commonly known as: KENALOG Apply 1 Application topically 2 (two) times daily.        Follow-up Information     Mavis Anes, MD Follow up.   Specialty: General Surgery Why: As needed.  Will call you next week for follow up. Contact information: [address] [place] [address] [phone]                 Signed: [name] 02/09/2024, 8:39 AM

## 2024-02-09 NOTE — TOC CM/SW Note (Signed)
 Transition of Care [facility]) - Inpatient Brief Assessment   Patient Details  Name: Anthony Austin MRN: [id number] Date of Birth: [birth year]  Transition of [facility] CM/SW Contact:    [name], LCSWA Phone Number: 02/09/2024, 9:00 AM   Clinical Narrative:   Transition of Care Department [facility]) has reviewed patient and no TOC needs have been identified at this time. We will continue to monitor patient advancement through interdisciplinary progression rounds. If new patient transition needs arise, please place a TOC consult.  Transition of Care Asessment: Insurance and Status: Insurance coverage has been reviewed Patient has primary care physician: Yes Home environment has been reviewed: Single Family Home Prior level of function:: Independent Prior/Current Home Services: No current home services Social Drivers of Health Review: SDOH reviewed no interventions necessary Readmission risk has been reviewed: Yes Transition of care needs: no transition of care needs at this time

## 2024-02-09 NOTE — Progress Notes (Signed)
 Patient's Potassium was 3.2 this am,Dr [name] notified.Plan of  care on going.

## 2024-02-10 LAB — SURGICAL PATHOLOGY

## 2024-02-13 LAB — CULTURE, BLOOD (ROUTINE X 2)
Culture: NO GROWTH
Culture: NO GROWTH
Special Requests: ADEQUATE

## 2024-02-14 NOTE — Anesthesia Postprocedure Evaluation (Signed)
 Anesthesia Post Note  Patient: Anthony Austin  Procedure(s) Performed: APPENDECTOMY, ROBOT-ASSISTED, LAPAROSCOPIC  Patient location during evaluation: Phase II Anesthesia Type: General Level of consciousness: awake Pain management: pain level controlled Vital Signs Assessment: post-procedure vital signs reviewed and stable Respiratory status: spontaneous breathing and respiratory function stable Cardiovascular status: blood pressure returned to baseline and stable Postop Assessment: no headache and no apparent nausea or vomiting Anesthetic complications: no Comments: Late entry   No notable events documented.   Last Vitals:  Vitals:   02/09/24 0730 02/09/24 0931  BP: 116/80 115/72  Pulse: 92 93  Resp: 16 18  Temp: 37.7 C 37.4 C  SpO2: 93% 94%    Last Pain:  Vitals:   02/09/24 0931  TempSrc: Oral  PainSc: 0-No pain                 Yvonna JINNY Bosworth

## 2024-02-17 ENCOUNTER — Encounter: Payer: Self-pay | Admitting: General Surgery

## 2024-02-17 ENCOUNTER — Ambulatory Visit: Admitting: General Surgery

## 2024-02-17 DIAGNOSIS — Z09 Encounter for follow-up examination after completed treatment for conditions other than malignant neoplasm: Secondary | ICD-10-CM

## 2024-02-17 DIAGNOSIS — K353 Acute appendicitis with localized peritonitis, without perforation or gangrene: Secondary | ICD-10-CM

## 2024-02-17 NOTE — Progress Notes (Signed)
 Surgical Date: 02/08/2024 Procedure: XI Robotic Assisted Laparoscopic Appendectomy    Patient returned call.   Reports that he is ding well after procedure. States that he has no concerns regarding incisions or healing.   Patient did endorse loose watery stools. States that he did complete additional antibiotics. States that he was given Augmentin  875/125 mg PO BID x 7 days from podiatry for an infected toe, so he did not take the 5 days Dr. Mavis prescribed. Advised that antibiotics may cause loose stools, but should improve as he has completed the course. Advised to add fiber either in diet or supplement to assist in bulking up stools.   Advised to contact office with any further questions or concerns.

## 2024-02-17 NOTE — Progress Notes (Signed)
 Attempted postoperative telephone visit.  Left message for patient to call me should he have any problems.

## 2024-02-25 DIAGNOSIS — M5116 Intervertebral disc disorders with radiculopathy, lumbar region: Secondary | ICD-10-CM | POA: Diagnosis not present

## 2024-03-08 ENCOUNTER — Encounter: Payer: Self-pay | Admitting: Family Medicine

## 2024-03-08 ENCOUNTER — Ambulatory Visit (INDEPENDENT_AMBULATORY_CARE_PROVIDER_SITE_OTHER): Admitting: Family Medicine

## 2024-03-08 VITALS — BP 153/87 | HR 67 | Temp 98.5°F | Ht 67.0 in | Wt 190.0 lb

## 2024-03-08 DIAGNOSIS — E1169 Type 2 diabetes mellitus with other specified complication: Secondary | ICD-10-CM | POA: Diagnosis not present

## 2024-03-08 DIAGNOSIS — E756 Lipid storage disorder, unspecified: Secondary | ICD-10-CM

## 2024-03-08 DIAGNOSIS — Z7984 Long term (current) use of oral hypoglycemic drugs: Secondary | ICD-10-CM

## 2024-03-08 DIAGNOSIS — E785 Hyperlipidemia, unspecified: Secondary | ICD-10-CM | POA: Diagnosis not present

## 2024-03-08 DIAGNOSIS — E1165 Type 2 diabetes mellitus with hyperglycemia: Secondary | ICD-10-CM | POA: Diagnosis not present

## 2024-03-08 DIAGNOSIS — I10 Essential (primary) hypertension: Secondary | ICD-10-CM | POA: Diagnosis not present

## 2024-03-08 DIAGNOSIS — E119 Type 2 diabetes mellitus without complications: Secondary | ICD-10-CM

## 2024-03-08 DIAGNOSIS — G5792 Unspecified mononeuropathy of left lower limb: Secondary | ICD-10-CM

## 2024-03-08 LAB — BAYER DCA HB A1C WAIVED: HB A1C (BAYER DCA - WAIVED): 6.2 % — ABNORMAL HIGH (ref 4.8–5.6)

## 2024-03-08 LAB — LIPID PANEL

## 2024-03-08 MED ORDER — GABAPENTIN 600 MG PO TABS: 1200.0000 mg | ORAL_TABLET | Freq: Two times a day (BID) | ORAL | 3 refills | Status: AC

## 2024-03-08 MED ORDER — DAPAGLIFLOZIN PROPANEDIOL 5 MG PO TABS: 5.0000 mg | ORAL_TABLET | Freq: Every day | ORAL | 3 refills | Status: DC

## 2024-03-08 NOTE — Progress Notes (Signed)
 Subjective:  Patient ID: Anthony Austin, male    DOB: 1961-02-25  Age: 63 y.o. MRN: [id number]  CC: Medical Management of Chronic Issues   HPI  Discussed the use of AI scribe software for clinical note transcription with the patient, who gave verbal consent to proceed.  History of Present Illness Anthony Austin is a 63 year old male who presents with left leg pain and diabetes management issues.  He experiences needle-like pain in the front of his left leg, below the knee, in the upper side of the muscle. The pain is rated 2/10 at rest and increases to above 5/10 with activity. This leg did not receive much exercise for two months prior to his back surgery due to the use of a walker. He is currently taking gabapentin  600 mg three times a day for this pain.  He recalls a recent incident where he pulled on a drop cord and felt a sensation in his back, leading to ongoing issues. He is concerned about potentially twisting something during this incident. He had back surgery previously and was informed that significant trauma would be required to disrupt the surgical repair.  Regarding his diabetes management, he had to stop taking Ozempic  prior to his back surgery. Upon attempting to restart it, he experienced significant side effects, including low blood pressure readings as low as 70/40. He has not been on any diabetes medication since his back surgery. His blood sugar level was 146 mg/dL when checked on Monday, and he is awaiting his A1c results to determine further management.  He had his appendix removed five weeks after his back surgery, which resolved without complications. He also mentions the need for an eye exam, noting that previous medications have affected his vision, and he has not yet returned for a follow-up exam.          06/07/2023   10:31 AM 05/05/2023    9:27 AM 02/01/2023    8:23 AM  Depression screen PHQ 2/9  Decreased Interest 0 0 0  Down, Depressed, Hopeless 0 0 0   PHQ - 2 Score 0 0 0    History Anthony Austin has a past medical history of Diabetes mellitus without complication (HCC), Heat exhaustion, Hyperlipidemia, Hypertension, and Prostate CA (HCC) (2005).   He has a past surgical history that includes Prostate surgery; Prostatectomy (2005); Prostatectomy; Colonoscopy (07/31/2010); Back surgery; and Xi robotic laparoscopic assisted appendectomy (N/A, 02/08/2024).   His family history includes Cancer in his father; Diabetes in his mother; Hypertension in his father and mother; Stroke in his mother.He reports that he has never smoked. He has never used smokeless tobacco. He reports that he does not drink alcohol and does not use drugs.    ROS Review of Systems  Constitutional: Negative.   HENT: Negative.    Eyes:  Negative for visual disturbance.  Respiratory:  Negative for cough and shortness of breath.   Cardiovascular:  Negative for chest pain and leg swelling.  Gastrointestinal:  Negative for abdominal pain, diarrhea, nausea and vomiting.  Genitourinary:  Negative for difficulty urinating.  Musculoskeletal:  Negative for arthralgias and myalgias.  Skin:  Negative for rash.  Neurological:  Negative for headaches.  Psychiatric/Behavioral:  Negative for sleep disturbance.     Objective:  BP (!) 153/87   Pulse 67   Temp 98.5 F (36.9 C)   Ht 5' 7 (1.702 m)   Wt 190 lb (86.2 kg)   SpO2 97%   BMI 29.76 kg/m  BP Readings from Last 3 Encounters:  03/08/24 (!) 153/87  02/09/24 115/72  02/08/24 126/73    Wt Readings from Last 3 Encounters:  03/08/24 190 lb (86.2 kg)  02/08/24 195 lb 5.2 oz (88.6 kg)  02/07/24 185 lb (83.9 kg)     Physical Exam Vitals reviewed.  Constitutional:      Appearance: He is well-developed.  HENT:     Head: Normocephalic and atraumatic.     Right Ear: External ear normal.     Left Ear: External ear normal.     Mouth/Throat:     Pharynx: No oropharyngeal exudate or posterior oropharyngeal erythema.   Eyes:     Pupils: Pupils are equal, round, and reactive to light.  Cardiovascular:     Rate and Rhythm: Normal rate and regular rhythm.     Heart sounds: No murmur heard. Pulmonary:     Effort: No respiratory distress.     Breath sounds: Normal breath sounds.  Musculoskeletal:     Cervical back: Normal range of motion and neck supple.  Neurological:     Mental Status: He is alert and oriented to person, place, and time.    Results for orders placed or performed in visit on 03/08/24  Bayer DCA Hb A1c Waived   Collection Time: 03/08/24  9:12 AM  Result Value Ref Range   HB A1C (BAYER DCA - WAIVED) 6.2 (H) 4.8 - 5.6 %  CMP14+EGFR   Collection Time: 03/08/24  9:14 AM  Result Value Ref Range   Glucose 143 (H) 70 - 99 mg/dL   BUN 11 8 - 27 mg/dL   Creatinine, Ser 8.97 0.76 - 1.27 mg/dL   eGFR 83 >40 fO/fpw/8.26   BUN/Creatinine Ratio 11 10 - 24   Sodium 140 134 - 144 mmol/L   Potassium 4.1 3.5 - 5.2 mmol/L   Chloride 98 96 - 106 mmol/L   CO2 27 20 - 29 mmol/L   Calcium  9.7 8.6 - 10.2 mg/dL   Total Protein 6.5 6.0 - 8.5 g/dL   Albumin 4.3 3.9 - 4.9 g/dL   Globulin, Total 2.2 1.5 - 4.5 g/dL   Bilirubin Total 0.6 0.0 - 1.2 mg/dL   Alkaline Phosphatase 67 47 - 123 IU/L   AST 30 0 - 40 IU/L   ALT 39 0 - 44 IU/L  CBC with Differential/Platelet   Collection Time: 03/08/24  9:14 AM  Result Value Ref Range   WBC 6.1 3.4 - 10.8 x10E3/uL   RBC 4.56 4.14 - 5.80 x10E6/uL   Hemoglobin 14.5 13.0 - 17.7 g/dL   Hematocrit 55.7 62.4 - 51.0 %   MCV 97 79 - 97 fL   MCH 31.8 26.6 - 33.0 pg   MCHC 32.8 31.5 - 35.7 g/dL   RDW 86.6 88.3 - 84.5 %   Platelets 198 150 - 450 x10E3/uL   Neutrophils 49 Not Estab. %   Lymphs 39 Not Estab. %   Monocytes 6 Not Estab. %   Eos 4 Not Estab. %   Basos 1 Not Estab. %   Neutrophils Absolute 3.0 1.4 - 7.0 x10E3/uL   Lymphocytes Absolute 2.4 0.7 - 3.1 x10E3/uL   Monocytes Absolute 0.4 0.1 - 0.9 x10E3/uL   EOS (ABSOLUTE) 0.3 0.0 - 0.4 x10E3/uL    Basophils Absolute 0.0 0.0 - 0.2 x10E3/uL   Immature Granulocytes 1 Not Estab. %   Immature Grans (Abs) 0.1 0.0 - 0.1 x10E3/uL  Lipid panel   Collection Time: 03/08/24  9:14 AM  Result Value Ref Range   Cholesterol, Total 123 100 - 199 mg/dL   Triglycerides 802 (H) 0 - 149 mg/dL   HDL 34 (L) >60 mg/dL   VLDL Cholesterol Cal 33 5 - 40 mg/dL   LDL Chol Calc (NIH) 56 0 - 99 mg/dL   Chol/HDL Ratio 3.6 0.0 - 5.0 ratio  Microalbumin / creatinine urine ratio   Collection Time: 03/08/24  9:57 AM  Result Value Ref Range   Creatinine, Urine 111.0 Not Estab. mg/dL   Microalbumin, Urine 87.8 Not Estab. ug/mL   Microalb/Creat Ratio 11 0 - 29 mg/g creat     Assessment & Plan:  Diabetic lipidosis (HCC) -     Bayer DCA Hb A1c Waived -     CMP14+EGFR -     Microalbumin / creatinine urine ratio  Essential hypertension -     CBC with Differential/Platelet  Hyperlipidemia, unspecified hyperlipidemia type -     Lipid panel  Diabetes mellitus treated with oral medication (HCC)  Neuropathy of left lower extremity  Other orders -     Gabapentin ; Take 2 tablets (1,200 mg total) by mouth 2 (two) times daily.  Dispense: 360 tablet; Refill: 3 -     Dapagliflozin  Propanediol; Take 1 tablet (5 mg total) by mouth daily before breakfast.  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 Pain is needle-like, ranging from 2/10 at rest to above 5/10 with activity. Current gabapentin  600 mg three times daily is insufficient. Increase gabapentin  to 600 mg four times daily and rewrite the prescription to ensure correct dosage.  Type 2 diabetes mellitus with hyperglycemia   Type 2 diabetes management is complicated by recent discontinuation of Ozempic  due to hypotensive episodes. Blood sugar was 146 mg/dL without medication. Awaiting A1c  results to guide further management. Check A1c level to assess glycemic control and consider switching to Mounjaro if A1c indicates the need for medication adjustment.       Follow-up: Return in about 3 months (around 06/07/2024).  Butler Der, M.D.

## 2024-03-09 LAB — CMP14+EGFR
ALT: 39 IU/L (ref 0–44)
AST: 30 IU/L (ref 0–40)
Albumin: 4.3 g/dL (ref 3.9–4.9)
Alkaline Phosphatase: 67 IU/L (ref 47–123)
BUN/Creatinine Ratio: 11 (ref 10–24)
BUN: 11 mg/dL (ref 8–27)
Bilirubin Total: 0.6 mg/dL (ref 0.0–1.2)
CO2: 27 mmol/L (ref 20–29)
Calcium: 9.7 mg/dL (ref 8.6–10.2)
Chloride: 98 mmol/L (ref 96–106)
Creatinine, Ser: 1.02 mg/dL (ref 0.76–1.27)
Globulin, Total: 2.2 g/dL (ref 1.5–4.5)
Glucose: 143 mg/dL — ABNORMAL HIGH (ref 70–99)
Potassium: 4.1 mmol/L (ref 3.5–5.2)
Sodium: 140 mmol/L (ref 134–144)
Total Protein: 6.5 g/dL (ref 6.0–8.5)
eGFR: 83 mL/min/1.73 (ref >59)

## 2024-03-09 LAB — CBC WITH DIFFERENTIAL/PLATELET
Basophils Absolute: 0 x10E3/uL (ref 0.0–0.2)
Basos: 1 %
EOS (ABSOLUTE): 0.3 x10E3/uL (ref 0.0–0.4)
Eos: 4 %
Hematocrit: 44.2 % (ref 37.5–51.0)
Hemoglobin: 14.5 g/dL (ref 13.0–17.7)
Immature Grans (Abs): 0.1 x10E3/uL (ref 0.0–0.1)
Immature Granulocytes: 1 %
Lymphocytes Absolute: 2.4 x10E3/uL (ref 0.7–3.1)
Lymphs: 39 %
MCH: 31.8 pg (ref 26.6–33.0)
MCHC: 32.8 g/dL (ref 31.5–35.7)
MCV: 97 fL (ref 79–97)
Monocytes Absolute: 0.4 x10E3/uL (ref 0.1–0.9)
Monocytes: 6 %
Neutrophils Absolute: 3 x10E3/uL (ref 1.4–7.0)
Neutrophils: 49 %
Platelets: 198 x10E3/uL (ref 150–450)
RBC: 4.56 x10E6/uL (ref 4.14–5.80)
RDW: 13.3 % (ref 11.6–15.4)
WBC: 6.1 x10E3/uL (ref 3.4–10.8)

## 2024-03-09 LAB — MICROALBUMIN / CREATININE URINE RATIO
Creatinine, Urine: 111 mg/dL
Microalb/Creat Ratio: 11 mg/g{creat} (ref 0–29)
Microalbumin, Urine: 12.1 ug/mL

## 2024-03-09 LAB — LIPID PANEL
Cholesterol, Total: 123 mg/dL (ref 100–199)
HDL: 34 mg/dL — AB (ref >39)
LDL CALC COMMENT:: 3.6 ratio (ref 0.0–5.0)
LDL Chol Calc (NIH): 56 mg/dL (ref 0–99)
Triglycerides: 197 mg/dL — AB (ref 0–149)
VLDL Cholesterol Cal: 33 mg/dL (ref 5–40)

## 2024-03-13 ENCOUNTER — Ambulatory Visit: Attending: Neurosurgery | Admitting: Physical Therapy

## 2024-03-13 ENCOUNTER — Ambulatory Visit: Payer: Self-pay | Admitting: Family Medicine

## 2024-03-13 DIAGNOSIS — R2689 Other abnormalities of gait and mobility: Secondary | ICD-10-CM | POA: Diagnosis not present

## 2024-03-13 DIAGNOSIS — M6281 Muscle weakness (generalized): Secondary | ICD-10-CM | POA: Insufficient documentation

## 2024-03-13 DIAGNOSIS — M25552 Pain in left hip: Secondary | ICD-10-CM | POA: Insufficient documentation

## 2024-03-13 DIAGNOSIS — R29818 Other symptoms and signs involving the nervous system: Secondary | ICD-10-CM | POA: Insufficient documentation

## 2024-03-13 DIAGNOSIS — M5459 Other low back pain: Secondary | ICD-10-CM | POA: Insufficient documentation

## 2024-03-13 NOTE — Progress Notes (Signed)
Hello Anthony Austin,  Your lab result is normal and/or stable.Some minor variations that are not significant are commonly marked abnormal, but do not represent any medical problem for you.  Best regards, Myron Stankovich, M.D.

## 2024-03-13 NOTE — Therapy (Signed)
 OUTPATIENT PHYSICAL THERAPY THORACOLUMBAR EVALUATION   Patient Name: Anthony Austin MRN: [id number] DOB:06/29/1960, 63 y.o., male Today's Date: 03/13/2024  END OF SESSION:  PT End of Session - 03/13/24 0838     Visit Number 1    Number of Visits 8    Date for Recertification  05/08/24    Authorization Type BCBS    PT Start Time 0840    PT Stop Time 0925    PT Time Calculation (min) 45 min    Activity Tolerance Patient tolerated treatment well          Past Medical History:  Diagnosis Date   Diabetes mellitus without complication (HCC)    Heat exhaustion    Hyperlipidemia    Hypertension    Prostate CA (HCC) 2005   Past Surgical History:  Procedure Laterality Date   BACK SURGERY     COLONOSCOPY  07/31/2010   Dr. Debrah   PROSTATE SURGERY     PROSTATECTOMY  2005   PROSTATECTOMY     XI ROBOTIC LAPAROSCOPIC ASSISTED APPENDECTOMY N/A 02/08/2024   Procedure: APPENDECTOMY, ROBOT-ASSISTED, LAPAROSCOPIC;  Surgeon: Mavis Anes, MD;  Location: [facility];  Service: General;  Laterality: N/A;   Patient Active Problem List   Diagnosis Date Noted   Acute appendicitis with localized peritonitis, without perforation, abscess, or gangrene 02/08/2024   S/P laparoscopic appendectomy 02/08/2024   Lumbar radiculopathy, acute 11/16/2023   Hyperlipidemia 08/11/2023   Diabetes mellitus treated with injections of non-insulin medication (HCC) 03/16/2023   Fatty liver 03/16/2023   BMI 29.0-29.9,adult 02/13/2019   Vitamin D  deficiency 02/13/2019   Paresthesia of both feet 02/13/2019   Claudication (HCC) 09/02/2016   Essential hypertension 09/20/2015    PCP: Zollie Lowers, MD  REFERRING PROVIDER: Mavis Purchase, MD  REFERRING DIAG: M51.16 (ICD-10-CM) - Intervertebral disc disorders with radiculopathy, lumbar region  Rationale for Evaluation and Treatment: Rehabilitation  THERAPY DIAG:  Other low back pain  Pain in left hip  Other abnormalities of gait and mobility  Muscle  weakness (generalized)  Other symptoms and signs involving the nervous system  ONSET DATE: December 23, 2023  SUBJECTIVE:                                                                                                                                                                                           SUBJECTIVE STATEMENT: Pt states he had increased back pain earlier this year with multiple ED visits and one night of hospitalization -- eventually got back surgery for bulging discs in July. States L5-S1 got repaired but L4 was not. Pt states his left foot stays numb a lot. Has  some L hip pain now that goes down his leg to lateral calf. Reports his balance is off -- L foot rolls. Sometimes my foot is fine and other times it's numb or burning. Later in the day he notes his leg fatigues. Difficulty using clutch on his tractor and stepping in/out of truck.   PERTINENT HISTORY:  12/23/23 L L5-S1 lateral diskectomy and then appendix surgery within the past few months HTN, diabetes   PAIN:  Are you having pain? Yes: NPRS scale: at rest 2 or 3 constant, at worst 5 Pain location: L low back and hip Pain description: numbness, burning Aggravating factors: walking, sitting Relieving factors: laying on back or pain medication  PRECAUTIONS: None  RED FLAGS: None   WEIGHT BEARING RESTRICTIONS: No  FALLS:  Has patient fallen in last 6 months? 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  DIAGNOSTIC FINDINGS:  MRI 11/14/23: IMPRESSION: 1. Likely  subacute left foraminal disc protrusion at L5-S1 causing prominent impingement on the left L5 nerve roots. 2. Moderate right eccentric impingement at L4-5 due to spondylosis and degenerative disc disease. 3. Scattered descending and sigmoid colon diverticula.  PATIENT SURVEYS:  Modified Oswestry:  MODIFIED OSWESTRY DISABILITY SCALE  Date: 03/13/24 Score  Pain intensity 3 =  Pain medication provides me with moderate relief from pain.  2. Personal care (washing, dressing, etc.) 0 =  I can take care of myself normally without causing increased pain.  3. Lifting 3 = Pain prevents me from lifting heavy weights, but I can manage (5) I have hardly any social life because of my pain. light to medium weights if they are conveniently positioned  4. Walking 1 = Pain prevents me from walking more than 1 mile.  5. Sitting 1 =  I can only sit in my favorite chair as long as I like.  6. Standing 1 =  I can stand as long as I want but, it increases my pain.  7. Sleeping 0 = Pain does not prevent me from sleeping well.  8. Social Life 0 = My social life is normal and does not increase my pain.  9. Traveling 1 =  I can travel anywhere, but it increases my pain.  10. Employment/ Homemaking 1 = My normal homemaking/job activities increase my pain, but I can still perform all that is required of me  Total 11/50   Interpretation of scores: Score Category Description  0-20% Minimal Disability The patient can cope with most living activities. Usually no treatment is indicated apart from advice on lifting, sitting and exercise  21-40% Moderate Disability The patient experiences more pain and difficulty with sitting, lifting and standing. Travel and social life are more difficult and they may be disabled from work. Personal care, sexual activity and sleeping are not grossly affected, and the patient can usually be managed by conservative means  41-60% Severe Disability Pain remains the main problem in this group, but  activities of daily living are affected. These patients require a detailed investigation  61-80% Crippled Back pain impinges on all aspects of the patient's life. Positive intervention is  required  81-100% Bed-bound  These patients are either bed-bound or exaggerating their symptoms  Bluford FORBES Zoe DELENA Karon DELENA, et al. Surgery versus conservative management of stable thoracolumbar fracture: the PRESTO feasibility RCT. [place] ([place]): [employer]; 2021 Nov. [facility] Technology Assessment, No. 25.62.) Appendix 3, Oswestry Disability Index category descriptors. Available from: [employer]el[employer]cz  Minimally Clinically Important Difference (MCID) = 12.8%  COGNITION: Overall cognitive status: Within functional limits for tasks assessed     SENSATION: A little numbness on top of L foot  MUSCLE LENGTH: Hamstrings: Right 60 deg; Left 45 deg Thomas test: WNL  POSTURE: No Significant postural limitations  PALPATION: TTP L hip flexor, L glute med and QL  LUMBAR ROM: Did not assess  AROM eval  Flexion   Extension   Right lateral flexion   Left lateral flexion   Right rotation   Left rotation    (Blank rows = not tested)  LOWER EXTREMITY ROM:   L LAQ lacks ~10 deg of full extension (increases pain)   LOWER EXTREMITY MMT:    MMT Right eval Left eval  Hip flexion 5 5  Hip extension  5  Hip abduction  3+  Hip adduction    Hip internal rotation    Hip external rotation    Knee flexion 5 4  Knee extension 5 5  Ankle dorsiflexion 5 3+  Ankle plantarflexion 5 3+  Ankle inversion 5 3+  Ankle eversion 5 3+   (Blank rows = not tested)  LUMBAR SPECIAL TESTS:  Straight leg raise test: Positive, Slump test: Positive, and FABER test: Negative  FUNCTIONAL TESTS:  SLS: R LE 9.63 sec, L LE 3.69 sec  GAIT: Distance walked: Into clinic Assistive device utilized: None Level of assistance: Complete Independence Comments: slightly guarded with L  stance phase  TREATMENT DATE: 03/13/24 See HEP below                                                                                                                                 PATIENT EDUCATION:  Education details: Exam findings, POC, initial HEP Person educated: Patient Education method: Explanation, Demonstration, and Handouts Education comprehension: verbalized understanding, returned demonstration, and needs further education  HOME EXERCISE PROGRAM: Access Code: [email or web address] URL: [email or web address] Date: 03/13/2024 Prepared by: [place] Moyd April Earnie Starring  Exercises - CLX Ankle Dorsiflexion and Eversion  - 1 x daily - 7 x weekly - 3 sets - 10 reps - Seated Ankle Inversion with Resistance and Legs Crossed  - 1 x daily - 7 x weekly - 3 sets - 10 reps - Seated Ankle Eversion with Resistance  - 1 x daily - 7 x weekly - 3 sets - 10 reps - Modified Thomas Stretch  - 1 x daily - 7 x weekly - 2 sets - 30 sec hold - Supine Piriformis Stretch with Foot on Ground  - 1 x daily - 7 x weekly - 2 sets -  30 sec hold - Seated Piriformis Stretch  - 1 x daily - 7 x weekly - 2 sets - 30 sec hold  ASSESSMENT:  CLINICAL IMPRESSION: Patient is a 63 y.o. M who was seen today for physical therapy evaluation and treatment for L LE weakness and numbness/tingling s/p L5-S1 lumbar diskectomy July 2025 and then had appendix removal shortly after. Assessment is significant for increased L LE neural tension with hip tightness, L LE weakness, decreased balance/stability and decreased activity tolerance affecting standing, walking and recreational activities. Pt will benefit from PT to improve on these deficits.    OBJECTIVE IMPAIRMENTS: Abnormal gait, decreased activity tolerance, decreased balance, decreased endurance, decreased mobility, difficulty walking, decreased ROM, decreased strength, hypomobility, increased fascial restrictions, increased muscle spasms, impaired flexibility, improper body  mechanics, postural dysfunction, and pain.   ACTIVITY LIMITATIONS: sitting, standing, squatting, stairs, transfers, and locomotion level  PARTICIPATION LIMITATIONS: driving, community activity, occupation, and yard work  PERSONAL FACTORS: Age, Fitness, Past/current experiences, and Time since onset of injury/illness/exacerbation are also affecting patient's functional outcome.   REHAB POTENTIAL: Good  CLINICAL DECISION MAKING: Evolving/moderate complexity  EVALUATION COMPLEXITY: Moderate   GOALS: Goals reviewed with patient? Yes  SHORT TERM GOALS: Target date: 04/10/2024   Pt will be ind with initial HEP Baseline: Goal status: INITIAL  2.  Pt will be able to perform full LAQ without pain to demo improving neural tension Baseline:  Goal status: INITIAL  3.  Pt will report decrease in symptoms by >/=25% Baseline:  Goal status: INITIAL   LONG TERM GOALS: Target date: 05/08/2024   Pt will be ind with management and progression of HEP Baseline:  Goal status: INITIAL  2.  Pt will be able to perform SLS for 10 sec bilaterally to demo increasing LE strength/stability Baseline:  Goal status: INITIAL  3.  Pt will be able to perform L single leg heel raise to demo increasing ankle strength  Baseline:  Goal status: INITIAL  4.  Pt will report improved modified Oswestry score to </=5 to demo MCID Baseline: 11 Goal status: INITIAL  5.  Pt will report improved symptoms by >/=50% Baseline:  Goal status: INITIAL   PLAN:  PT FREQUENCY: 1-2x/week  PT DURATION: 8 weeks  PLANNED INTERVENTIONS: [id number]- PT Re-evaluation, 97750- Physical Performance Testing, 97110-Therapeutic exercises, 97530- Therapeutic activity, [id number]- Neuromuscular re-education, 97535- Self Care, [id number]- Manual therapy, [phone]- Gait training, Patient/Family education, Balance training, Stair training, Cryotherapy, and Moist heat.  PLAN FOR NEXT SESSION: Assess response to HEP. Stretch hips. Incorporate  neural glides. Strengthen ankle and L LE.    Vivian Neuwirth April Ma L Sereniti Wan, PT, DPT 03/13/2024, 11:15 AM

## 2024-03-14 ENCOUNTER — Encounter: Payer: Self-pay | Admitting: Family Medicine

## 2024-03-14 DIAGNOSIS — E1169 Type 2 diabetes mellitus with other specified complication: Secondary | ICD-10-CM | POA: Insufficient documentation

## 2024-03-21 DIAGNOSIS — N3001 Acute cystitis with hematuria: Secondary | ICD-10-CM | POA: Diagnosis not present

## 2024-03-21 DIAGNOSIS — R300 Dysuria: Secondary | ICD-10-CM | POA: Diagnosis not present

## 2024-03-23 DIAGNOSIS — M5116 Intervertebral disc disorders with radiculopathy, lumbar region: Secondary | ICD-10-CM | POA: Diagnosis not present

## 2024-03-24 ENCOUNTER — Ambulatory Visit: Attending: Neurosurgery | Admitting: Physical Therapy

## 2024-03-24 ENCOUNTER — Encounter: Payer: Self-pay | Admitting: Physical Therapy

## 2024-03-24 DIAGNOSIS — M25552 Pain in left hip: Secondary | ICD-10-CM | POA: Insufficient documentation

## 2024-03-24 DIAGNOSIS — M6281 Muscle weakness (generalized): Secondary | ICD-10-CM | POA: Diagnosis not present

## 2024-03-24 DIAGNOSIS — M5459 Other low back pain: Secondary | ICD-10-CM | POA: Insufficient documentation

## 2024-03-24 DIAGNOSIS — R2689 Other abnormalities of gait and mobility: Secondary | ICD-10-CM | POA: Diagnosis not present

## 2024-03-24 NOTE — Therapy (Signed)
 OUTPATIENT PHYSICAL THERAPY TREATMENT   Patient Name: Anthony Austin MRN: [id number] DOB:1960-12-03, 63 y.o., male Today's Date: 03/24/2024  END OF SESSION:  PT End of Session - 03/24/24 1011     Visit Number 2    Number of Visits 8    Date for Recertification  05/08/24    Authorization Type BCBS    PT Start Time 1015    PT Stop Time 1055    PT Time Calculation (min) 40 min    Activity Tolerance Patient tolerated treatment well           Past Medical History:  Diagnosis Date   Diabetes mellitus without complication (HCC)    Heat exhaustion    Hyperlipidemia    Hypertension    Prostate CA (HCC) 2005   Past Surgical History:  Procedure Laterality Date   BACK SURGERY     COLONOSCOPY  07/31/2010   Dr. Debrah   PROSTATE SURGERY     PROSTATECTOMY  2005   PROSTATECTOMY     XI ROBOTIC LAPAROSCOPIC ASSISTED APPENDECTOMY N/A 02/08/2024   Procedure: APPENDECTOMY, ROBOT-ASSISTED, LAPAROSCOPIC;  Surgeon: Mavis Anes, MD;  Location: [facility];  Service: General;  Laterality: N/A;   Patient Active Problem List   Diagnosis Date Noted   Diabetic lipidosis (HCC) 03/14/2024   Acute appendicitis with localized peritonitis, without perforation, abscess, or gangrene 02/08/2024   S/P laparoscopic appendectomy 02/08/2024   Lumbar radiculopathy, acute 11/16/2023   Hyperlipidemia 08/11/2023   Diabetes mellitus treated with injections of non-insulin medication (HCC) 03/16/2023   Fatty liver 03/16/2023   BMI 29.0-29.9,adult 02/13/2019   Vitamin D  deficiency 02/13/2019   Paresthesia of both feet 02/13/2019   Claudication 09/02/2016   Essential hypertension 09/20/2015    PCP: Zollie Lowers, MD  REFERRING PROVIDER: Mavis Purchase, MD  REFERRING DIAG: M51.16 (ICD-10-CM) - Intervertebral disc disorders with radiculopathy, lumbar region  Rationale for Evaluation and Treatment: Rehabilitation  THERAPY DIAG:  Other low back pain  Pain in left hip  Other abnormalities of gait and  mobility  Muscle weakness (generalized)  ONSET DATE: December 23, 2023  SUBJECTIVE:                                                                                                                                                                                           SUBJECTIVE STATEMENT: Pt states he went hunting and sat for 4 hours. When he got up he was walking like a drunk.  Pain is along L lateral hip this morning.   From eval: Pt states he had increased back pain earlier this year with multiple ED visits and one night of  hospitalization -- eventually got back surgery for bulging discs in July. States L5-S1 got repaired but L4 was not. Pt states his left foot stays numb a lot. Has some L hip pain now that goes down his leg to lateral calf. Reports his balance is off -- L foot rolls. Sometimes my foot is fine and other times it's numb or burning. Later in the day he notes his leg fatigues. Difficulty using clutch on his tractor and stepping in/out of truck.   PERTINENT HISTORY:  12/23/23 L L5-S1 lateral diskectomy and then appendix surgery within the past few months HTN, diabetes   PAIN:  Are you having pain? Yes: NPRS scale: 2-3 today, at worst 5 Pain location: L low back and hip Pain description: numbness, burning Aggravating factors: walking, sitting Relieving factors: laying on back or pain medication  PRECAUTIONS: None  RED FLAGS: None   WEIGHT BEARING RESTRICTIONS: No  FALLS:  Has patient fallen in last 6 months? 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  DIAGNOSTIC FINDINGS:  MRI 11/14/23: IMPRESSION: 1. Likely subacute left foraminal disc protrusion at L5-S1 causing prominent impingement on the left L5 nerve roots. 2. Moderate right eccentric impingement at L4-5 due to spondylosis and degenerative disc disease. 3. Scattered descending and sigmoid colon diverticula.  PATIENT SURVEYS:  Modified Oswestry:  MODIFIED OSWESTRY DISABILITY SCALE  Date: 03/13/24 Score  Pain intensity 3 =  Pain medication provides me with moderate relief from pain.  2. Personal care (washing, dressing, etc.) 0 =  I can take care of myself normally without causing increased pain.  3. Lifting 3 = Pain prevents me from lifting heavy weights, but I can manage (5) I have hardly any social life because of my pain. light to medium weights if they are conveniently positioned  4. Walking 1 = Pain prevents me from walking more than 1 mile.  5. Sitting 1 =  I can only sit in my favorite chair as long as I like.  6. Standing 1 =  I can stand as long as I want but, it increases my pain.  7. Sleeping 0 = Pain does not prevent me from sleeping well.  8. Social Life 0 = My social life is normal and does not increase my pain.  9. Traveling 1 =  I can travel anywhere, but it increases my pain.  10. Employment/ Homemaking 1 = My normal homemaking/job activities increase my pain, but I can still perform all that is required of me  Total 11/50   Interpretation of scores: Score Category Description  0-20% Minimal Disability The patient can cope with most living activities. Usually no treatment is indicated apart from advice on lifting, sitting and exercise  21-40% Moderate Disability The patient experiences more pain and difficulty with sitting, lifting and standing. Travel and social life are more difficult and they may be disabled from work. Personal care, sexual activity and sleeping are not grossly affected, and the patient can usually be managed  by conservative means  41-60% Severe Disability Pain remains the main problem in this group, but activities of daily  living are affected. These patients require a detailed investigation  61-80% Crippled Back pain impinges on all aspects of the patient's life. Positive intervention is required  81-100% Bed-bound  These patients are either bed-bound or exaggerating their symptoms  Bluford FORBES Zoe DELENA Karon DELENA, et al. Surgery versus conservative management of stable thoracolumbar fracture: the PRESTO feasibility RCT. [place] ([place]): [employer]; 2021 Nov. [facility] Technology Assessment, No. 25.62.) Appendix 3, Oswestry Disability Index category descriptors. Available from: [employer]el[employer]cz  Minimally Clinically Important Difference (MCID) = 12.8%  COGNITION: Overall cognitive status: Within functional limits for tasks assessed     SENSATION: A little numbness on top of L foot  MUSCLE LENGTH: Hamstrings: Right 60 deg; Left 45 deg Thomas test: WNL  POSTURE: No Significant postural limitations  PALPATION: TTP L hip flexor, L glute med and QL  LUMBAR ROM: Did not assess  AROM eval  Flexion   Extension   Right lateral flexion   Left lateral flexion   Right rotation   Left rotation    (Blank rows = not tested)  LOWER EXTREMITY ROM:   L LAQ lacks ~10 deg of full extension (increases pain)   LOWER EXTREMITY MMT:    MMT Right eval Left eval  Hip flexion 5 5  Hip extension  5  Hip abduction  3+  Hip adduction    Hip internal rotation    Hip external rotation    Knee flexion 5 4  Knee extension 5 5  Ankle dorsiflexion 5 3+  Ankle plantarflexion 5 3+  Ankle inversion 5 3+  Ankle eversion 5 3+   (Blank rows = not tested)  LUMBAR SPECIAL TESTS:  Straight leg raise test: Positive, Slump test: Positive, and FABER test: Negative  FUNCTIONAL TESTS:  SLS: R LE 9.63 sec, L LE 3.69 sec  GAIT: Distance walked: Into clinic Assistive  device utilized: None Level of assistance: Complete Independence Comments: slightly guarded with L stance phase  TREATMENT DATE:  03/24/24 Nustep L3 x 10 min UEs/LEs Standing R lateral hip shift into Eckstrom x10 Seated piriformis stretch x 30 Seated figure 4 stretch x 30 Supine ITB stretch with strap x 30 Supine modified thomas stretch x 30 Manual therapy: STM & TPR L glute med and along greater trochanter Sidelying clamshell yellow TB 2x10 Sidelying reverse clam yellow TB 2x10 Sidelying plank x20 Self care: standing posture and reducing trendelenburg  03/13/24 See HEP below                                                                                                                                 PATIENT EDUCATION:  Education details: HEP updates Person educated: Patient Education method: Explanation, Demonstration, and Handouts Education comprehension: verbalized understanding, returned demonstration, and needs further education  HOME EXERCISE PROGRAM: Access Code: [email or web address] URL: [email or web address] Date: 03/24/2024 Prepared by: Chrisette Man April Earnie Starring  Exercises - CLX Ankle Dorsiflexion and Eversion  - 1 x daily -  7 x weekly - 3 sets - 10 reps - Seated Ankle Inversion with Resistance and Legs Crossed  - 1 x daily - 7 x weekly - 3 sets - 10 reps - Seated Ankle Eversion with Resistance  - 1 x daily - 7 x weekly - 3 sets - 10 reps - Modified Thomas Stretch  - 1 x daily - 7 x weekly - 2 sets - 30 sec hold - Supine Piriformis Stretch with Foot on Ground  - 1 x daily - 7 x weekly - 2 sets - 30 sec hold - Seated Piriformis Stretch  - 1 x daily - 7 x weekly - 2 sets - 30 sec hold - Clam with Resistance  - 1 x daily - 7 x weekly - 2 sets - 10 reps - Sidelying Reverse Clamshell with Resistance  - 1 x daily - 7 x weekly - 2 sets - 10 reps - Side Plank on Knees  - 1 x daily - 7 x weekly - 3 sets - 10 sec hold  ASSESSMENT:  CLINICAL IMPRESSION: Pt reports  reduction of pain by end of session. Manual work and stretching to address lateral hip pain. Initiated core and hip strengthening. Pt is very weak with his hip abductors/ER and IR.   From eval: Patient is a 63 y.o. M who was seen today for physical therapy evaluation and treatment for L LE weakness and numbness/tingling s/p L5-S1 lumbar diskectomy July 2025 and then had appendix removal shortly after. Assessment is significant for increased L LE neural tension with hip tightness, L LE weakness, decreased balance/stability and decreased activity tolerance affecting standing, walking and recreational activities. Pt will benefit from PT to improve on these deficits.    OBJECTIVE IMPAIRMENTS: Abnormal gait, decreased activity tolerance, decreased balance, decreased endurance, decreased mobility, difficulty walking, decreased ROM, decreased strength, hypomobility, increased fascial restrictions, increased muscle spasms, impaired flexibility, improper body mechanics, postural dysfunction, and pain.   ACTIVITY LIMITATIONS: sitting, standing, squatting, stairs, transfers, and locomotion level  PARTICIPATION LIMITATIONS: driving, community activity, occupation, and yard work  PERSONAL FACTORS: Age, Fitness, Past/current experiences, and Time since onset of injury/illness/exacerbation are also affecting patient's functional outcome.   REHAB POTENTIAL: Good  CLINICAL DECISION MAKING: Evolving/moderate complexity  EVALUATION COMPLEXITY: Moderate   GOALS: Goals reviewed with patient? Yes  SHORT TERM GOALS: Target date: 04/10/2024   Pt will be ind with initial HEP Baseline: Goal status: INITIAL  2.  Pt will be able to perform full LAQ without pain to demo improving neural tension Baseline:  Goal status: INITIAL  3.  Pt will report decrease in symptoms by >/=25% Baseline:  Goal status: INITIAL   LONG TERM GOALS: Target date: 05/08/2024   Pt will be ind with management and progression of  HEP Baseline:  Goal status: INITIAL  2.  Pt will be able to perform SLS for 10 sec bilaterally to demo increasing LE strength/stability Baseline:  Goal status: INITIAL  3.  Pt will be able to perform L single leg heel raise to demo increasing ankle strength  Baseline:  Goal status: INITIAL  4.  Pt will report improved modified Oswestry score to </=5 to demo MCID Baseline: 11 Goal status: INITIAL  5.  Pt will report improved symptoms by >/=50% Baseline:  Goal status: INITIAL   PLAN:  PT FREQUENCY: 1-2x/week  PT DURATION: 8 weeks  PLANNED INTERVENTIONS: [id number]- PT Re-evaluation, 97750- Physical Performance Testing, 97110-Therapeutic exercises, 97530- Therapeutic activity, [id number]- Neuromuscular re-education, 97535- Self Care, [id number]- Manual  therapy, [phone]- Gait training, Patient/Family education, Balance training, Stair training, Cryotherapy, and Moist heat.  PLAN FOR NEXT SESSION: Assess response to HEP. Stretch hips. Incorporate neural glides. Strengthen ankle and L LE.    Tamzin Bertling April Ma L Arshia Spellman, PT, DPT 03/24/2024, 10:11 AM

## 2024-03-29 ENCOUNTER — Ambulatory Visit: Admitting: Physical Therapy

## 2024-03-29 ENCOUNTER — Encounter: Payer: Self-pay | Admitting: Physical Therapy

## 2024-03-29 DIAGNOSIS — M6281 Muscle weakness (generalized): Secondary | ICD-10-CM | POA: Diagnosis not present

## 2024-03-29 DIAGNOSIS — M25552 Pain in left hip: Secondary | ICD-10-CM

## 2024-03-29 DIAGNOSIS — M5459 Other low back pain: Secondary | ICD-10-CM

## 2024-03-29 DIAGNOSIS — R2689 Other abnormalities of gait and mobility: Secondary | ICD-10-CM

## 2024-03-29 NOTE — Therapy (Signed)
 OUTPATIENT PHYSICAL THERAPY TREATMENT   Patient Name: Anthony Austin MRN: [id number] DOB:Jul 18, 1960, 63 y.o., male Today's Date: 03/29/2024  END OF SESSION:  PT End of Session - 03/29/24 0758     Visit Number 3    Number of Visits 8    Date for Recertification  05/08/24    Authorization Type BCBS    PT Start Time 0800    PT Stop Time 0840    PT Time Calculation (min) 40 min    Activity Tolerance Patient tolerated treatment well          Past Medical History:  Diagnosis Date   Diabetes mellitus without complication (HCC)    Heat exhaustion    Hyperlipidemia    Hypertension    Prostate CA (HCC) 2005   Past Surgical History:  Procedure Laterality Date   BACK SURGERY     COLONOSCOPY  07/31/2010   Dr. Debrah   PROSTATE SURGERY     PROSTATECTOMY  2005   PROSTATECTOMY     XI ROBOTIC LAPAROSCOPIC ASSISTED APPENDECTOMY N/A 02/08/2024   Procedure: APPENDECTOMY, ROBOT-ASSISTED, LAPAROSCOPIC;  Surgeon: Mavis Anes, MD;  Location: [facility];  Service: General;  Laterality: N/A;   Patient Active Problem List   Diagnosis Date Noted   Diabetic lipidosis (HCC) 03/14/2024   Acute appendicitis with localized peritonitis, without perforation, abscess, or gangrene 02/08/2024   S/P laparoscopic appendectomy 02/08/2024   Lumbar radiculopathy, acute 11/16/2023   Hyperlipidemia 08/11/2023   Diabetes mellitus treated with injections of non-insulin medication (HCC) 03/16/2023   Fatty liver 03/16/2023   BMI 29.0-29.9,adult 02/13/2019   Vitamin D  deficiency 02/13/2019   Paresthesia of both feet 02/13/2019   Claudication 09/02/2016   Essential hypertension 09/20/2015    PCP: Zollie Lowers, MD  REFERRING PROVIDER: Mavis Purchase, MD  REFERRING DIAG: M51.16 (ICD-10-CM) - Intervertebral disc disorders with radiculopathy, lumbar region  Rationale for Evaluation and Treatment: Rehabilitation  THERAPY DIAG:  Other low back pain  Pain in left hip  Other abnormalities of gait and  mobility  Muscle weakness (generalized)  ONSET DATE: December 23, 2023  SUBJECTIVE:                                                                                                                                                                                           SUBJECTIVE STATEMENT: Pt reports lateral hip pain this morning. Usually ibuprofen  helps but didn't take it yet this morning.   From eval: Pt states he had increased back pain earlier this year with multiple ED visits and one night of hospitalization -- eventually got back surgery for bulging discs in July. States L5-S1  got repaired but L4 was not. Pt states his left foot stays numb a lot. Has some L hip pain now that goes down his leg to lateral calf. Reports his balance is off -- L foot rolls. Sometimes my foot is fine and other times it's numb or burning. Later in the day he notes his leg fatigues. Difficulty using clutch on his tractor and stepping in/out of truck.   PERTINENT HISTORY:  12/23/23 L L5-S1 lateral diskectomy and then appendix surgery within the past few months HTN, diabetes   PAIN:  Are you having pain? Yes: NPRS scale: 2.5 today, at worst 5 Pain location: L low back and hip Pain description: numbness, burning Aggravating factors: walking, sitting Relieving factors: laying on back or pain medication  PRECAUTIONS: None  RED FLAGS: None   WEIGHT BEARING RESTRICTIONS: No  FALLS:  Has patient fallen in last 6 months? 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  DIAGNOSTIC FINDINGS:  MRI 11/14/23: IMPRESSION: 1. Likely subacute left foraminal disc protrusion at L5-S1 causing prominent impingement on the left L5 nerve roots. 2. Moderate right eccentric impingement at L4-5 due to spondylosis and degenerative disc disease. 3. Scattered descending and sigmoid colon diverticula.  PATIENT SURVEYS:  Modified Oswestry:  MODIFIED OSWESTRY DISABILITY SCALE  Date: 03/13/24 Score  Pain intensity 3 =  Pain medication provides me with moderate relief from pain.  2. Personal care (washing, dressing, etc.) 0 =  I can take care of myself normally without causing increased pain.  3. Lifting 3 = Pain prevents me from lifting heavy weights, but I can manage (5) I have hardly any social life because of my pain. light to medium weights if they are conveniently positioned  4. Walking 1 = Pain prevents me from walking more than 1 mile.  5. Sitting 1 =  I can only sit in my favorite chair as long as I like.  6. Standing 1 =  I can stand as long as I want but, it increases my pain.  7. Sleeping 0 = Pain does not prevent me from sleeping well.  8. Social Life 0 = My social life is normal and does not increase my pain.  9. Traveling 1 =  I can travel anywhere, but it increases my pain.  10. Employment/ Homemaking 1 = My normal homemaking/job activities increase my pain, but I can still perform all that is required of me  Total 11/50   Interpretation of scores: Score Category Description  0-20% Minimal Disability The patient can cope with most living activities. Usually no treatment is indicated apart from advice on lifting, sitting and exercise  21-40% Moderate Disability The patient experiences more pain and difficulty with sitting, lifting and standing. Travel and social life are more difficult and they may be disabled from work. Personal care, sexual activity and sleeping are not grossly affected, and the patient can usually be managed by conservative means   41-60% Severe Disability Pain remains the main problem in this group, but activities of daily living are affected. These patients require a detailed investigation  61-80% Crippled Back  pain impinges on all aspects of the patient's life. Positive intervention is required  81-100% Bed-bound  These patients are either bed-bound or exaggerating their symptoms  Bluford FORBES Zoe DELENA Karon DELENA, et al. Surgery versus conservative management of stable thoracolumbar fracture: the PRESTO feasibility RCT. [place] ([place]): [employer]; 2021 Nov. [facility] Technology Assessment, No. 25.62.) Appendix 3, Oswestry Disability Index category descriptors. Available from: [employer]cz  Minimally Clinically Important Difference (MCID) = 12.8%  COGNITION: Overall cognitive status: Within functional limits for tasks assessed     SENSATION: A little numbness on top of L foot  MUSCLE LENGTH: Hamstrings: Right 60 deg; Left 45 deg Thomas test: WNL  POSTURE: No Significant postural limitations  PALPATION: TTP L hip flexor, L glute med and QL  LUMBAR ROM: Did not assess  AROM eval  Flexion   Extension   Right lateral flexion   Left lateral flexion   Right rotation   Left rotation    (Blank rows = not tested)  LOWER EXTREMITY ROM:   L LAQ lacks ~10 deg of full extension (increases pain)   LOWER EXTREMITY MMT:    MMT Right eval Left eval  Hip flexion 5 5  Hip extension  5  Hip abduction  3+  Hip adduction    Hip internal rotation    Hip external rotation    Knee flexion 5 4  Knee extension 5 5  Ankle dorsiflexion 5 3+  Ankle plantarflexion 5 3+  Ankle inversion 5 3+  Ankle eversion 5 3+   (Blank rows = not tested)  LUMBAR SPECIAL TESTS:  Straight leg raise test: Positive, Slump test: Positive, and FABER test: Negative  FUNCTIONAL TESTS:  SLS: R LE 9.63 sec, L LE 3.69 sec  GAIT: Distance walked: Into clinic Assistive device utilized:  None Level of assistance: Complete Independence Comments: slightly guarded with L stance phase  TREATMENT DATE:  03/29/24 Nustep L6 x 10 min UEs/LEs Standing lateral hip shift into Capaldi x10 Supine LTR 2x30 Supine hamstring stretch with strap 2x30 Supine ITB stretch with strap x 30 Supine sciatic nerve glide x10 Supine piriformis stretch 2x 30  Manual therapy: STM & TPR L glute med and max  Sidelying clamshell red TB 2x10 Sidelying hip abd 2x8 Sitting ankle DF + Ev 2x10 red TB Sitting ankle inv red TB 2x10 Standing calf stretch x 30 Standing hip flexion x30 Standing heel raise 2x10   03/24/24 Nustep L3 x 10 min UEs/LEs Standing R lateral hip shift into Critzer x10 Seated piriformis stretch x 30 Seated figure 4 stretch x 30 Supine ITB stretch with strap x 30 Supine modified thomas stretch x 30 Manual therapy: STM & TPR L glute med and along greater trochanter Sidelying clamshell yellow TB 2x10 Sidelying reverse clam yellow TB 2x10 Sidelying plank x20 Self care: standing posture and reducing trendelenburg  03/13/24 See HEP below  PATIENT EDUCATION:  Education details: HEP updates Person educated: Patient Education method: Explanation, Demonstration, and Handouts Education comprehension: verbalized understanding, returned demonstration, and needs further education  HOME EXERCISE PROGRAM: Access Code: [email or web address] URL: [email or web address] Date: 03/24/2024 Prepared by: Bibi Economos April Earnie Starring  Exercises - CLX Ankle Dorsiflexion and Eversion  - 1 x daily - 7 x weekly - 3 sets - 10 reps - Seated Ankle Inversion with Resistance and Legs Crossed  - 1 x daily - 7 x weekly - 3 sets - 10 reps - Seated Ankle Eversion with Resistance  - 1 x daily - 7 x weekly - 3 sets - 10 reps - Modified Thomas Stretch  - 1 x daily - 7 x weekly  - 2 sets - 30 sec hold - Supine Piriformis Stretch with Foot on Ground  - 1 x daily - 7 x weekly - 2 sets - 30 sec hold - Seated Piriformis Stretch  - 1 x daily - 7 x weekly - 2 sets - 30 sec hold - Clam with Resistance  - 1 x daily - 7 x weekly - 2 sets - 10 reps - Sidelying Reverse Clamshell with Resistance  - 1 x daily - 7 x weekly - 2 sets - 10 reps - Side Plank on Knees  - 1 x daily - 7 x weekly - 3 sets - 10 sec hold  ASSESSMENT:  CLINICAL IMPRESSION: Continued trigger points in L upper glute med/max addressed with stretching and manual work. Progressive resistance exercises tolerated with red TB today. Added sciatic nerve glides to address his neural tension.   From eval: Patient is a 63 y.o. M who was seen today for physical therapy evaluation and treatment for L LE weakness and numbness/tingling s/p L5-S1 lumbar diskectomy July 2025 and then had appendix removal shortly after. Assessment is significant for increased L LE neural tension with hip tightness, L LE weakness, decreased balance/stability and decreased activity tolerance affecting standing, walking and recreational activities. Pt will benefit from PT to improve on these deficits.    OBJECTIVE IMPAIRMENTS: Abnormal gait, decreased activity tolerance, decreased balance, decreased endurance, decreased mobility, difficulty walking, decreased ROM, decreased strength, hypomobility, increased fascial restrictions, increased muscle spasms, impaired flexibility, improper body mechanics, postural dysfunction, and pain.   ACTIVITY LIMITATIONS: sitting, standing, squatting, stairs, transfers, and locomotion level  PARTICIPATION LIMITATIONS: driving, community activity, occupation, and yard work  PERSONAL FACTORS: Age, Fitness, Past/current experiences, and Time since onset of injury/illness/exacerbation are also affecting patient's functional outcome.   REHAB POTENTIAL: Good  CLINICAL DECISION MAKING: Evolving/moderate  complexity  EVALUATION COMPLEXITY: Moderate   GOALS: Goals reviewed with patient? Yes  SHORT TERM GOALS: Target date: 04/10/2024   Pt will be ind with initial HEP Baseline: Goal status: INITIAL  2.  Pt will be able to perform full LAQ without pain to demo improving neural tension Baseline:  Goal status: INITIAL  3.  Pt will report decrease in symptoms by >/=25% Baseline:  Goal status: INITIAL   LONG TERM GOALS: Target date: 05/08/2024   Pt will be ind with management and progression of HEP Baseline:  Goal status: INITIAL  2.  Pt will be able to perform SLS for 10 sec bilaterally to demo increasing LE strength/stability Baseline:  Goal status: INITIAL  3.  Pt will be able to perform L single leg heel raise to demo increasing ankle strength  Baseline:  Goal status: INITIAL  4.  Pt will report improved modified Oswestry score to </=5 to demo MCID  Baseline: 11 Goal status: INITIAL  5.  Pt will report improved symptoms by >/=50% Baseline:  Goal status: INITIAL   PLAN:  PT FREQUENCY: 1-2x/week  PT DURATION: 8 weeks  PLANNED INTERVENTIONS: [id number]- PT Re-evaluation, 97750- Physical Performance Testing, 97110-Therapeutic exercises, 97530- Therapeutic activity, [id number]- Neuromuscular re-education, 97535- Self Care, [id number]- Manual therapy, [phone]- Gait training, Patient/Family education, Balance training, Stair training, Cryotherapy, and Moist heat.  PLAN FOR NEXT SESSION: Assess response to HEP. Stretch hips. Incorporate neural glides. Strengthen ankle and L LE.    Anthony Austin April Ma L Jacqulin Brandenburger, PT, DPT 03/29/2024, 7:58 AM

## 2024-04-07 ENCOUNTER — Ambulatory Visit

## 2024-04-07 DIAGNOSIS — M5459 Other low back pain: Secondary | ICD-10-CM | POA: Diagnosis not present

## 2024-04-07 DIAGNOSIS — M6281 Muscle weakness (generalized): Secondary | ICD-10-CM | POA: Diagnosis not present

## 2024-04-07 DIAGNOSIS — R2689 Other abnormalities of gait and mobility: Secondary | ICD-10-CM

## 2024-04-07 DIAGNOSIS — M25552 Pain in left hip: Secondary | ICD-10-CM | POA: Diagnosis not present

## 2024-04-07 NOTE — Therapy (Signed)
 OUTPATIENT PHYSICAL THERAPY TREATMENT   Patient Name: Anthony Austin MRN: [id number] DOB:[birth year], 63 y.o., male Today's Date: 04/07/2024  END OF SESSION:  PT End of Session - 04/07/24 0804     Visit Number 4    Number of Visits 8    Date for Recertification  05/08/24    Authorization Type BCBS    PT Start Time 0800    PT Stop Time 0844    PT Time Calculation (min) 44 min    Activity Tolerance Patient tolerated treatment well          Past Medical History:  Diagnosis Date   Diabetes mellitus without complication (HCC)    Heat exhaustion    Hyperlipidemia    Hypertension    Prostate CA (HCC) 2005   Past Surgical History:  Procedure Laterality Date   BACK SURGERY     COLONOSCOPY  07/31/2010   Dr. Debrah   PROSTATE SURGERY     PROSTATECTOMY  2005   PROSTATECTOMY     XI ROBOTIC LAPAROSCOPIC ASSISTED APPENDECTOMY N/A 02/08/2024   Procedure: APPENDECTOMY, ROBOT-ASSISTED, LAPAROSCOPIC;  Surgeon: Mavis Anes, MD;  Location: [facility];  Service: General;  Laterality: N/A;   Patient Active Problem List   Diagnosis Date Noted   Diabetic lipidosis (HCC) 03/14/2024   Acute appendicitis with localized peritonitis, without perforation, abscess, or gangrene 02/08/2024   S/P laparoscopic appendectomy 02/08/2024   Lumbar radiculopathy, acute 11/16/2023   Hyperlipidemia 08/11/2023   Diabetes mellitus treated with injections of non-insulin medication (HCC) 03/16/2023   Fatty liver 03/16/2023   BMI 29.0-29.9,adult 02/13/2019   Vitamin D  deficiency 02/13/2019   Paresthesia of both feet 02/13/2019   Claudication 09/02/2016   Essential hypertension 09/20/2015    PCP: Zollie Lowers, MD  REFERRING PROVIDER: Mavis Purchase, MD  REFERRING DIAG: M51.16 (ICD-10-CM) - Intervertebral disc disorders with radiculopathy, lumbar region  Rationale for Evaluation and Treatment: Rehabilitation  THERAPY DIAG:  Other low back pain  Pain in left hip  Other abnormalities of gait and  mobility  Muscle weakness (generalized)  ONSET DATE: December 23, 2023  SUBJECTIVE:                                                                                                                                                                                           SUBJECTIVE STATEMENT: Pt reports 2-3/10 left low back pain.  PERTINENT HISTORY:  12/23/23 L L5-S1 lateral diskectomy and then appendix surgery within the past few months HTN, diabetes   PAIN:  Are you having pain? Yes: NPRS scale: 2-3/10 Pain location: L low back and hip Pain description: numbness, burning Aggravating factors:  walking, sitting Relieving factors: laying on back or pain medication  PRECAUTIONS: None  RED FLAGS: None   WEIGHT BEARING RESTRICTIONS: No  FALLS:  Has patient fallen in last 6 months? 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  DIAGNOSTIC FINDINGS:  MRI 11/14/23: IMPRESSION: 1. Likely subacute left foraminal disc protrusion at L5-S1 causing prominent impingement on the left L5 nerve roots. 2. Moderate right eccentric impingement at L4-5 due to spondylosis and degenerative disc disease. 3. Scattered descending and sigmoid colon diverticula.  PATIENT SURVEYS:  Modified Oswestry:  MODIFIED OSWESTRY DISABILITY SCALE  Date: 03/13/24 Score  Pain intensity 3 =  Pain medication provides me with moderate relief from pain.  2. Personal care (washing, dressing, etc.) 0 =  I can take care of myself normally without causing increased pain.  3. Lifting  3 = Pain prevents me from lifting heavy weights, but I can manage (5) I have hardly any social life because of my pain. light to medium weights if they are conveniently positioned  4. Walking 1 = Pain prevents me from walking more than 1 mile.  5. Sitting 1 =  I can only sit in my favorite chair as long as I like.  6. Standing 1 =  I can stand as long as I want but, it increases my pain.  7. Sleeping 0 = Pain does not prevent me from sleeping well.  8. Social Life 0 = My social life is normal and does not increase my pain.  9. Traveling 1 =  I can travel anywhere, but it increases my pain.  10. Employment/ Homemaking 1 = My normal homemaking/job activities increase my pain, but I can still perform all that is required of me  Total 11/50   Interpretation of scores: Score Category Description  0-20% Minimal Disability The patient can cope with most living activities. Usually no treatment is indicated apart from advice on lifting, sitting and exercise  21-40% Moderate Disability The patient experiences more pain and difficulty with sitting, lifting and standing. Travel and social life are more difficult and they may be disabled from work. Personal care, sexual activity and sleeping are not grossly affected, and the patient can usually be managed by conservative means  41-60% Severe Disability Pain remains the main problem in this group, but activities of daily living are affected. These patients require a detailed investigation  61-80% Crippled Back pain impinges on all aspects of the patient's life. Positive intervention is required  81-100% Bed-bound  These patients are either bed-bound or exaggerating their symptoms  Bluford FORBES Zoe DELENA Karon DELENA, et al. Surgery versus conservative management of stable thoracolumbar fracture: the PRESTO feasibility RCT. [place] ([place]): [employer]; 2021 Nov. [employer] Assessment, No. 25.62.) Appendix 3, Oswestry Disability Index category  descriptors. Available from: [employer]cz  Minimally Clinically Important Difference (MCID) = 12.8%  COGNITION: Overall cognitive status: Within functional limits for tasks assessed     SENSATION: A little numbness on top of L foot  MUSCLE LENGTH: Hamstrings: Right 60 deg; Left 45 deg Thomas test: WNL  POSTURE: No Significant postural limitations  PALPATION: TTP L hip flexor, L glute med and QL  LUMBAR ROM: Did not assess  AROM eval  Flexion   Extension   Right lateral flexion   Left lateral flexion   Right rotation   Left rotation    (Blank rows = not tested)  LOWER EXTREMITY ROM:   L LAQ lacks ~10 deg of full extension (increases pain)   LOWER EXTREMITY MMT:    MMT Right eval Left eval  Hip flexion 5 5  Hip extension  5  Hip abduction  3+  Hip adduction    Hip internal rotation    Hip external rotation    Knee flexion 5 4  Knee extension 5 5  Ankle dorsiflexion 5 3+  Ankle plantarflexion 5 3+  Ankle inversion 5 3+  Ankle eversion 5 3+   (Blank rows = not tested)  LUMBAR SPECIAL TESTS:  Straight leg raise test: Positive, Slump test: Positive, and FABER test: Negative  FUNCTIONAL TESTS:  SLS: R LE 9.63 sec, L LE 3.69 sec  GAIT: Distance walked: Into clinic Assistive device utilized: None Level of assistance: Complete Independence Comments: slightly guarded with L stance phase  TREATMENT DATE:   04/07/24                                 EXERCISE LOG  Exercise Repetitions and Resistance Comments  Nustep Lvl 3 x 13 mins   Rockerboard 3 mins   Forward Lunge 14 box x 3 mins each side   Lateral Step Ups 25 reps bil        Blank cell = exercise not performed today   Manual Therapy Soft Tissue Mobilization: Left low back, STW/M to left lumbar musculature to decrease pain and tone with pt in right side-lying for comfort   03/29/24 Nustep L6 x 10 min UEs/LEs Standing lateral hip shift into [place] x10 Supine LTR  2x30 Supine hamstring stretch with strap 2x30 Supine ITB stretch with strap x 30 Supine sciatic nerve glide x10 Supine piriformis stretch 2x 30  Manual therapy: STM & TPR L glute med and max  Sidelying clamshell red TB 2x10 Sidelying hip abd 2x8 Sitting ankle DF + Ev 2x10 red TB Sitting ankle inv red TB 2x10 Standing calf stretch x 30 Standing hip flexion x30 Standing heel raise 2x10   03/24/24 Nustep L3 x 10 min UEs/LEs Standing R lateral hip shift into [place] x10 Seated piriformis stretch x 30 Seated figure 4 stretch x 30 Supine ITB stretch with strap x 30 Supine modified thomas stretch x 30 Manual therapy: STM & TPR L glute med and along greater trochanter Sidelying clamshell yellow TB 2x10 Sidelying reverse clam yellow TB 2x10 Sidelying plank x20 Self care: standing posture and reducing trendelenburg  03/13/24 See HEP below  PATIENT EDUCATION:  Education details: HEP updates Person educated: Patient Education method: Explanation, Demonstration, and Handouts Education comprehension: verbalized understanding, returned demonstration, and needs further education  HOME EXERCISE PROGRAM: Access Code: [email or web address] URL: [email or web address] Date: 03/24/2024 Prepared by: Gellen April Earnie Starring  Exercises - CLX Ankle Dorsiflexion and Eversion  - 1 x daily - 7 x weekly - 3 sets - 10 reps - Seated Ankle Inversion with Resistance and Legs Crossed  - 1 x daily - 7 x weekly - 3 sets - 10 reps - Seated Ankle Eversion with Resistance  - 1 x daily - 7 x weekly - 3 sets - 10 reps - Modified Thomas Stretch  - 1 x daily - 7 x weekly - 2 sets - 30 sec hold - Supine Piriformis Stretch with Foot on Ground  - 1 x daily - 7 x weekly - 2 sets - 30 sec hold - Seated Piriformis Stretch  - 1 x daily - 7 x weekly - 2 sets - 30 sec hold - Clam  with Resistance  - 1 x daily - 7 x weekly - 2 sets - 10 reps - Sidelying Reverse Clamshell with Resistance  - 1 x daily - 7 x weekly - 2 sets - 10 reps - Side Plank on Knees  - 1 x daily - 7 x weekly - 3 sets - 10 sec hold  ASSESSMENT:  CLINICAL IMPRESSION: Pt arrives for today's treatment session reporting 2-3/10 left low back pain.  Pt reports continued balance and strength issues in his left hip.  Pt introduced to new standing exercises today with min cues required for proper technique and posture.  STW/M performed to left lumbar paraspinals to decrease pain and tone.  Pt reported decreased pain at completion of today's treatment session.  OBJECTIVE IMPAIRMENTS: Abnormal gait, decreased activity tolerance, decreased balance, decreased endurance, decreased mobility, difficulty walking, decreased ROM, decreased strength, hypomobility, increased fascial restrictions, increased muscle spasms, impaired flexibility, improper body mechanics, postural dysfunction, and pain.   ACTIVITY LIMITATIONS: sitting, standing, squatting, stairs, transfers, and locomotion level  PARTICIPATION LIMITATIONS: driving, community activity, occupation, and yard work  PERSONAL FACTORS: Age, Fitness, Past/current experiences, and Time since onset of injury/illness/exacerbation are also affecting patient's functional outcome.   REHAB POTENTIAL: Good  CLINICAL DECISION MAKING: Evolving/moderate complexity  EVALUATION COMPLEXITY: Moderate   GOALS: Goals reviewed with patient? Yes  SHORT TERM GOALS: Target date: 04/10/2024   Pt will be ind with initial HEP Baseline: Goal status: INITIAL  2.  Pt will be able to perform full LAQ without pain to demo improving neural tension Baseline:  Goal status: INITIAL  3.  Pt will report decrease in symptoms by >/=25% Baseline:  Goal status: INITIAL   LONG TERM GOALS: Target date: 05/08/2024   Pt will be ind with management and progression of HEP Baseline:  Goal  status: INITIAL  2.  Pt will be able to perform SLS for 10 sec bilaterally to demo increasing LE strength/stability Baseline:  Goal status: INITIAL  3.  Pt will be able to perform L single leg heel raise to demo increasing ankle strength  Baseline:  Goal status: INITIAL  4.  Pt will report improved modified Oswestry score to </=5 to demo MCID Baseline: 11 Goal status: INITIAL  5.  Pt will report improved symptoms by >/=50% Baseline:  Goal status: INITIAL   PLAN:  PT FREQUENCY: 1-2x/week  PT DURATION: 8 weeks  PLANNED INTERVENTIONS: 97164- PT Re-evaluation, 97750- Physical Performance Testing, 97110-Therapeutic exercises, 97530-  Therapeutic activity, [id number]- Neuromuscular re-education, [phone]- Self Care, [phone]- Manual therapy, [phone]- Gait training, Patient/Family education, Balance training, Stair training, Cryotherapy, and Moist heat.  PLAN FOR NEXT SESSION: Assess response to HEP. Stretch hips. Incorporate neural glides. Strengthen ankle and L LE.    Delon DELENA Gosling, PTA, DPT 04/07/2024, 10:21 AM

## 2024-04-13 ENCOUNTER — Ambulatory Visit: Admitting: Physical Therapy

## 2024-04-13 ENCOUNTER — Encounter: Payer: Self-pay | Admitting: Physical Therapy

## 2024-04-13 DIAGNOSIS — M5459 Other low back pain: Secondary | ICD-10-CM | POA: Diagnosis not present

## 2024-04-13 DIAGNOSIS — M6281 Muscle weakness (generalized): Secondary | ICD-10-CM | POA: Diagnosis not present

## 2024-04-13 DIAGNOSIS — R2689 Other abnormalities of gait and mobility: Secondary | ICD-10-CM | POA: Diagnosis not present

## 2024-04-13 DIAGNOSIS — M25552 Pain in left hip: Secondary | ICD-10-CM | POA: Diagnosis not present

## 2024-04-13 NOTE — Therapy (Signed)
 OUTPATIENT PHYSICAL THERAPY TREATMENT   Patient Name: Anthony Austin MRN: [id number] DOB:09-28-1960, 63 y.o., male Today's Date: 04/13/2024  END OF SESSION:  PT End of Session - 04/13/24 0756     Visit Number 5    Number of Visits 8    Date for Recertification  05/08/24    Authorization Type BCBS    PT Start Time 0800    PT Stop Time 0840    PT Time Calculation (min) 40 min    Activity Tolerance Patient tolerated treatment well           Past Medical History:  Diagnosis Date   Diabetes mellitus without complication (HCC)    Heat exhaustion    Hyperlipidemia    Hypertension    Prostate CA (HCC) 2005   Past Surgical History:  Procedure Laterality Date   BACK SURGERY     COLONOSCOPY  07/31/2010   Dr. Debrah   PROSTATE SURGERY     PROSTATECTOMY  2005   PROSTATECTOMY     XI ROBOTIC LAPAROSCOPIC ASSISTED APPENDECTOMY N/A 02/08/2024   Procedure: APPENDECTOMY, ROBOT-ASSISTED, LAPAROSCOPIC;  Surgeon: Mavis Anes, MD;  Location: [facility];  Service: General;  Laterality: N/A;   Patient Active Problem List   Diagnosis Date Noted   Diabetic lipidosis (HCC) 03/14/2024   Acute appendicitis with localized peritonitis, without perforation, abscess, or gangrene 02/08/2024   S/P laparoscopic appendectomy 02/08/2024   Lumbar radiculopathy, acute 11/16/2023   Hyperlipidemia 08/11/2023   Diabetes mellitus treated with injections of non-insulin medication (HCC) 03/16/2023   Fatty liver 03/16/2023   BMI 29.0-29.9,adult 02/13/2019   Vitamin D  deficiency 02/13/2019   Paresthesia of both feet 02/13/2019   Claudication 09/02/2016   Essential hypertension 09/20/2015    PCP: Zollie Lowers, MD  REFERRING PROVIDER: Mavis Purchase, MD  REFERRING DIAG: M51.16 (ICD-10-CM) - Intervertebral disc disorders with radiculopathy, lumbar region  Rationale for Evaluation and Treatment: Rehabilitation  THERAPY DIAG:  Other low back pain  Pain in left hip  Other abnormalities of gait and  mobility  Muscle weakness (generalized)  ONSET DATE: December 23, 2023  SUBJECTIVE:                                                                                                                                                                                           SUBJECTIVE STATEMENT: Pt reports no new issues.   PERTINENT HISTORY:  12/23/23 L L5-S1 lateral diskectomy and then appendix surgery within the past few months HTN, diabetes   PAIN:  Are you having pain? Yes: NPRS scale: 2-3/10 Pain location: L low back and hip Pain description: numbness, burning Aggravating factors:  walking, sitting Relieving factors: laying on back or pain medication  PRECAUTIONS: None  RED FLAGS: None   WEIGHT BEARING RESTRICTIONS: No  FALLS:  Has patient fallen in last 6 months? 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  DIAGNOSTIC FINDINGS:  MRI 11/14/23: IMPRESSION: 1. Likely subacute left foraminal disc protrusion at L5-S1 causing prominent impingement on the left L5 nerve roots. 2. Moderate right eccentric impingement at L4-5 due to spondylosis and degenerative disc disease. 3. Scattered descending and sigmoid colon diverticula.  PATIENT SURVEYS:  Modified Oswestry:  MODIFIED OSWESTRY DISABILITY SCALE  Date: 03/13/24 Score  Pain intensity 3 =  Pain medication provides me with moderate relief from pain.  2. Personal care (washing, dressing, etc.) 0 =  I can take care of myself normally without causing increased pain.  3. Lifting 3 = Pain  prevents me from lifting heavy weights, but I can manage (5) I have hardly any social life because of my pain. light to medium weights if they are conveniently positioned  4. Walking 1 = Pain prevents me from walking more than 1 mile.  5. Sitting 1 =  I can only sit in my favorite chair as long as I like.  6. Standing 1 =  I can stand as long as I want but, it increases my pain.  7. Sleeping 0 = Pain does not prevent me from sleeping well.  8. Social Life 0 = My social life is normal and does not increase my pain.  9. Traveling 1 =  I can travel anywhere, but it increases my pain.  10. Employment/ Homemaking 1 = My normal homemaking/job activities increase my pain, but I can still perform all that is required of me  Total 11/50   Interpretation of scores: Score Category Description  0-20% Minimal Disability The patient can cope with most living activities. Usually no treatment is indicated apart from advice on lifting, sitting and exercise  21-40% Moderate Disability The patient experiences more pain and difficulty with sitting, lifting and standing. Travel and social life are more difficult and they may be disabled from work. Personal care, sexual activity and sleeping are not grossly affected, and the patient can usually be managed by conservative means  41-60% Severe Disability Pain remains the main problem in this group, but activities of daily living are affected. These patients require a detailed investigation  61-80% Crippled Back pain impinges on all aspects of the patient's life. Positive intervention is required  81-100% Bed-bound  These patients are either bed-bound or exaggerating their symptoms  Bluford FORBES Zoe DELENA Karon DELENA, et al. Surgery versus conservative management of stable thoracolumbar fracture: the PRESTO feasibility RCT. [place] ([place]): [employer]; 2021 Nov. [facility] Technology Assessment, No. 25.62.) Appendix 3, Oswestry Disability Index category descriptors.  Available from: [email or web address].cz  Minimally Clinically Important Difference (MCID) = 12.8%   SENSATION: A little numbness on top of L foot  MUSCLE LENGTH: Hamstrings: Right 60 deg; Left 45 deg Thomas test: WNL  PALPATION: TTP L hip flexor, L glute med and QL  LUMBAR ROM: Did not assess  LOWER  EXTREMITY ROM:   L LAQ lacks ~10 deg of full extension (increases pain)   LOWER EXTREMITY MMT:    MMT Right eval Left eval  Hip flexion 5 5  Hip extension  5  Hip abduction  3+  Hip adduction    Hip internal rotation    Hip external rotation    Knee flexion 5 4  Knee extension 5 5  Ankle dorsiflexion 5 3+  Ankle plantarflexion 5 3+  Ankle inversion 5 3+  Ankle eversion 5 3+   (Blank rows = not tested)  LUMBAR SPECIAL TESTS:  Straight leg raise test: Positive, Slump test: Positive, and FABER test: Negative  FUNCTIONAL TESTS:  SLS: R LE 9.63 sec, L LE 3.69 sec  GAIT: Distance walked: Into clinic Assistive device utilized: None Level of assistance: Complete Independence Comments: slightly guarded with L stance phase   TREATMENT DATE:  04/07/24                                 EXERCISE LOG  Exercise Repetitions and Resistance Comments  Nustep Lvl 6 x 10 mins   Rockerboard in ant/post position: Fwd/bwd Static balance 2X1 min each   Rockerboard in lateral position: Side to side Static balance 2X1 min each   Standing hamstring stretch  X30   Forward step tap 6 step 2x10   Side step tap 6 step 2x10   [place] 5x5    Blank cell = exercise not performed today     04/07/24                                 EXERCISE LOG  Exercise Repetitions and Resistance Comments  Nustep Lvl 3 x 13 mins   Rockerboard 3 mins   Forward Lunge 14 box x 3 mins each side   Lateral Step Ups 25 reps bil        Blank cell = exercise not performed today   Manual Therapy Soft Tissue Mobilization: Left low back, STW/M to left lumbar musculature to  decrease pain and tone with pt in right side-lying for comfort   03/29/24 Nustep L6 x 10 min UEs/LEs Standing lateral hip shift into [place] x10 Supine LTR 2x30 Supine hamstring stretch with strap 2x30 Supine ITB stretch with strap x 30 Supine sciatic nerve glide x10 Supine piriformis stretch 2x 30  Manual therapy: STM & TPR L glute med and max  Sidelying clamshell red TB 2x10 Sidelying hip abd 2x8 Sitting ankle DF + Ev 2x10 red TB Sitting ankle inv red TB 2x10 Standing calf stretch x 30 Standing hip flexion x30 Standing heel raise 2x10     PATIENT EDUCATION:  Education details: HEP updates Person educated: Patient Education method: Explanation, Demonstration, and Handouts Education comprehension: verbalized understanding, returned demonstration, and needs further education  HOME EXERCISE PROGRAM: Access Code: [email or web address] URL: [email or web address] Date: 03/24/2024 Prepared by: Jezlyn Westerfield April Earnie Starring  Exercises - CLX Ankle Dorsiflexion and Eversion  - 1 x daily - 7 x weekly - 3 sets - 10 reps - Seated Ankle Inversion with Resistance and Legs Crossed  - 1 x daily - 7 x weekly - 3 sets - 10 reps - Seated Ankle Eversion with Resistance  - 1 x daily - 7 x weekly - 3 sets - 10 reps - Modified Thomas Stretch  - 1 x daily - 7 x  weekly - 2 sets - 30 sec hold - Supine Piriformis Stretch with Foot on Ground  - 1 x daily - 7 x weekly - 2 sets - 30 sec hold - Seated Piriformis Stretch  - 1 x daily - 7 x weekly - 2 sets - 30 sec hold - Clam with Resistance  - 1 x daily - 7 x weekly - 2 sets - 10 reps - Sidelying Reverse Clamshell with Resistance  - 1 x daily - 7 x weekly - 2 sets - 10 reps - Side Plank on Knees  - 1 x daily - 7 x weekly - 3 sets - 10 sec hold  ASSESSMENT:  CLINICAL IMPRESSION: Pt will have follow up with his spine surgeon later this month. Treatment focused on progressing pt's strength and balance in standing for improved stability. Pt highly  challenged with step taps.   OBJECTIVE IMPAIRMENTS: Abnormal gait, decreased activity tolerance, decreased balance, decreased endurance, decreased mobility, difficulty walking, decreased ROM, decreased strength, hypomobility, increased fascial restrictions, increased muscle spasms, impaired flexibility, improper body mechanics, postural dysfunction, and pain.   ACTIVITY LIMITATIONS: sitting, standing, squatting, stairs, transfers, and locomotion level  PARTICIPATION LIMITATIONS: driving, community activity, occupation, and yard work  PERSONAL FACTORS: Age, Fitness, Past/current experiences, and Time since onset of injury/illness/exacerbation are also affecting patient's functional outcome.   REHAB POTENTIAL: Good  CLINICAL DECISION MAKING: Evolving/moderate complexity  EVALUATION COMPLEXITY: Moderate   GOALS: Goals reviewed with patient? Yes  SHORT TERM GOALS: Target date: 04/10/2024   Pt will be ind with initial HEP Baseline: Goal status: MET  2.  Pt will be able to perform full LAQ without pain to demo improving neural tension Baseline:  Goal status: IN PROGRESS  3.  Pt will report decrease in symptoms by >/=25% Baseline:  Goal status: IN PROGRESS   LONG TERM GOALS: Target date: 05/08/2024   Pt will be ind with management and progression of HEP Baseline:  Goal status: INITIAL  2.  Pt will be able to perform SLS for 10 sec bilaterally to demo increasing LE strength/stability Baseline:  Goal status: INITIAL  3.  Pt will be able to perform L single leg heel raise to demo increasing ankle strength  Baseline:  Goal status: INITIAL  4.  Pt will report improved modified Oswestry score to </=5 to demo MCID Baseline: 11 Goal status: INITIAL  5.  Pt will report improved symptoms by >/=50% Baseline:  Goal status: INITIAL   PLAN:  PT FREQUENCY: 1-2x/week  PT DURATION: 8 weeks  PLANNED INTERVENTIONS: 97164- PT Re-evaluation, 97750- Physical Performance Testing,  97110-Therapeutic exercises, 97530- Therapeutic activity, [id number]- Neuromuscular re-education, 97535- Self Care, 02859- Manual therapy, [phone]- Gait training, Patient/Family education, Balance training, Stair training, Cryotherapy, and Moist heat.  PLAN FOR NEXT SESSION: Assess response to HEP. Stretch hips. Incorporate neural glides. Strengthen ankle and L LE.    Leodis Alcocer April Ma L Vera Furniss, PT, DPT 04/13/2024, 7:56 AM

## 2024-04-20 ENCOUNTER — Ambulatory Visit: Admitting: Physical Therapy

## 2024-04-21 DIAGNOSIS — Z6830 Body mass index (BMI) 30.0-30.9, adult: Secondary | ICD-10-CM | POA: Diagnosis not present

## 2024-04-21 DIAGNOSIS — M5116 Intervertebral disc disorders with radiculopathy, lumbar region: Secondary | ICD-10-CM | POA: Diagnosis not present

## 2024-04-27 ENCOUNTER — Encounter: Payer: Self-pay | Admitting: Physical Therapy

## 2024-04-27 ENCOUNTER — Ambulatory Visit: Attending: Neurosurgery | Admitting: Physical Therapy

## 2024-04-27 DIAGNOSIS — M6281 Muscle weakness (generalized): Secondary | ICD-10-CM | POA: Insufficient documentation

## 2024-04-27 DIAGNOSIS — M5459 Other low back pain: Secondary | ICD-10-CM | POA: Insufficient documentation

## 2024-04-27 DIAGNOSIS — M25552 Pain in left hip: Secondary | ICD-10-CM | POA: Insufficient documentation

## 2024-04-27 DIAGNOSIS — R2689 Other abnormalities of gait and mobility: Secondary | ICD-10-CM | POA: Diagnosis not present

## 2024-04-27 DIAGNOSIS — R29818 Other symptoms and signs involving the nervous system: Secondary | ICD-10-CM | POA: Insufficient documentation

## 2024-04-27 NOTE — Therapy (Signed)
 OUTPATIENT PHYSICAL THERAPY TREATMENT   Patient Name: Anthony Austin MRN: [id number] DOB:[birth year], 63 y.o., male Today's Date: 04/27/2024  END OF SESSION:  PT End of Session - 04/27/24 0754     Visit Number 6    Number of Visits 8    Date for Recertification  05/08/24    Authorization Type BCBS    PT Start Time 0800    PT Stop Time 0840    PT Time Calculation (min) 40 min    Activity Tolerance Patient tolerated treatment well            Past Medical History:  Diagnosis Date   Diabetes mellitus without complication (HCC)    Heat exhaustion    Hyperlipidemia    Hypertension    Prostate CA (HCC) 2005   Past Surgical History:  Procedure Laterality Date   BACK SURGERY     COLONOSCOPY  07/31/2010   Dr. Debrah   PROSTATE SURGERY     PROSTATECTOMY  2005   PROSTATECTOMY     XI ROBOTIC LAPAROSCOPIC ASSISTED APPENDECTOMY N/A 02/08/2024   Procedure: APPENDECTOMY, ROBOT-ASSISTED, LAPAROSCOPIC;  Surgeon: Mavis Anes, MD;  Location: [facility];  Service: General;  Laterality: N/A;   Patient Active Problem List   Diagnosis Date Noted   Diabetic lipidosis (HCC) 03/14/2024   Acute appendicitis with localized peritonitis, without perforation, abscess, or gangrene 02/08/2024   S/P laparoscopic appendectomy 02/08/2024   Lumbar radiculopathy, acute 11/16/2023   Hyperlipidemia 08/11/2023   Diabetes mellitus treated with injections of non-insulin medication (HCC) 03/16/2023   Fatty liver 03/16/2023   BMI 29.0-29.9,adult 02/13/2019   Vitamin D  deficiency 02/13/2019   Paresthesia of both feet 02/13/2019   Claudication 09/02/2016   Essential hypertension 09/20/2015    PCP: Zollie Lowers, MD  REFERRING PROVIDER: Mavis Purchase, MD  REFERRING DIAG: M51.16 (ICD-10-CM) - Intervertebral disc disorders with radiculopathy, lumbar region  Rationale for Evaluation and Treatment: Rehabilitation  THERAPY DIAG:  Other low back pain  Pain in left hip  Other abnormalities of gait and  mobility  Muscle weakness (generalized)  Other symptoms and signs involving the nervous system  ONSET DATE: December 23, 2023  SUBJECTIVE:                                                                                                                                                                                           SUBJECTIVE STATEMENT: Pt reports L foot burning. Did do some shoveling corn on Monday and can feel it some in his L low back.   PERTINENT HISTORY:  12/23/23 L L5-S1 lateral diskectomy and then appendix surgery within the past few months  HTN, diabetes   PAIN:  Are you having pain? Yes: NPRS scale: 2-3/10 Pain location: L low back and hip Pain description: numbness, burning Aggravating factors: walking, sitting Relieving factors: laying on back or pain medication  PRECAUTIONS: None  RED FLAGS: None   WEIGHT BEARING RESTRICTIONS: No  FALLS:  Has patient fallen in last 6 months? 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  DIAGNOSTIC FINDINGS:  MRI 11/14/23: IMPRESSION: 1. Likely subacute left foraminal disc protrusion at L5-S1 causing prominent impingement on the left L5 nerve roots. 2. Moderate right eccentric impingement at L4-5 due to spondylosis and degenerative disc disease. 3. Scattered descending and sigmoid colon diverticula.  PATIENT SURVEYS:  Modified Oswestry:  MODIFIED OSWESTRY DISABILITY SCALE  Date: 03/13/24 Score  Pain intensity 3 =  Pain medication provides me with moderate relief from pain.  2.  Personal care (washing, dressing, etc.) 0 =  I can take care of myself normally without causing increased pain.  3. Lifting 3 = Pain prevents me from lifting heavy weights, but I can manage (5) I have hardly any social life because of my pain. light to medium weights if they are conveniently positioned  4. Walking 1 = Pain prevents me from walking more than 1 mile.  5. Sitting 1 =  I can only sit in my favorite chair as long as I like.  6. Standing 1 =  I can stand as long as I want but, it increases my pain.  7. Sleeping 0 = Pain does not prevent me from sleeping well.  8. Social Life 0 = My social life is normal and does not increase my pain.  9. Traveling 1 =  I can travel anywhere, but it increases my pain.  10. Employment/ Homemaking 1 = My normal homemaking/job activities increase my pain, but I can still perform all that is required of me  Total 11/50   Interpretation of scores: Score Category Description  0-20% Minimal Disability The patient can cope with most living activities. Usually no treatment is indicated apart from advice on lifting, sitting and exercise  21-40% Moderate Disability The patient experiences more pain and difficulty with sitting, lifting and standing. Travel and social life are more difficult and they may be disabled from work. Personal care, sexual activity and sleeping are not grossly affected, and the patient can usually be managed by conservative means  41-60% Severe Disability Pain remains the main problem in this group, but activities of daily living are affected. These patients require a detailed investigation  61-80% Crippled Back pain impinges on all aspects of the patient's life. Positive intervention is required  81-100% Bed-bound  These patients are either bed-bound or exaggerating their symptoms  Bluford FORBES Zoe DELENA Karon DELENA, et al. Surgery versus conservative management of stable thoracolumbar fracture: the PRESTO feasibility RCT. [place] ([place]): [employer]; 2021 Nov. [facility] Technology Assessment, No. 25.62.) Appendix 3, Oswestry Disability Index category descriptors. Available from: [email or web address].cz  Minimally Clinically Important Difference (MCID) = 12.8%   SENSATION: A little numbness on top of L foot  MUSCLE LENGTH: Hamstrings: Right  60 deg; Left 45 deg Thomas test: WNL  PALPATION: TTP L hip flexor, L glute med and QL  LUMBAR ROM: Did not assess  LOWER EXTREMITY ROM:   L LAQ lacks ~10 deg of full extension (increases pain)   LOWER EXTREMITY MMT:    MMT Right eval Left eval  Hip flexion 5 5  Hip extension  5  Hip abduction  3+  Hip adduction    Hip internal rotation    Hip external rotation    Knee flexion 5 4  Knee extension 5 5  Ankle dorsiflexion 5 3+  Ankle plantarflexion 5 3+  Ankle inversion 5 3+  Ankle eversion 5 3+   (Blank rows = not tested)  LUMBAR SPECIAL TESTS:  Straight leg raise test: Positive, Slump test: Positive, and FABER test: Negative  FUNCTIONAL TESTS:  SLS: R LE 9.63 sec, L LE 3.69 sec  GAIT: Distance walked: Into clinic Assistive device utilized: None Level of assistance: Complete Independence Comments: slightly guarded with L stance phase   TREATMENT DATE:  04/27/24                                 EXERCISE LOG  Exercise Repetitions and Resistance Comments  Nustep Lvl 6 x 10 mins   Seated figure 4 stretch 2x30   Seated piriformis stretch 2x30   Standing hamstring stretch  2x30   Sidelying clamshell green TB 2x10   Sidelying reverse green TB  2x10   Forward plank 2x30   Single leg heel raise 2x10   Slow march on airex 2x30   Single leg, tap other foot fwd, side and back standing on airex x10    Blank cell = exercise not performed today   Manual Therapy Soft Tissue Mobilization: Left low back, STW/M to left lumbar musculature to decrease pain and tone with pt in right side-lying for comfort    04/07/24                                  EXERCISE LOG  Exercise Repetitions and Resistance Comments  Nustep Lvl 6 x 10 mins   Rockerboard in ant/post position: Fwd/bwd Static balance 2X1 min each   Rockerboard in lateral position: Side to side Static balance 2X1 min each   Standing hamstring stretch  X30   Forward step tap 6 step 2x10   Side step tap 6 step 2x10   [place] 5x5    Blank cell = exercise not performed today     04/07/24                                 EXERCISE LOG  Exercise Repetitions and Resistance Comments  Nustep Lvl 3 x 13 mins   Rockerboard 3 mins   Forward Lunge 14 box x 3 mins each side   Lateral Step Ups 25 reps bil        Blank cell = exercise not performed today   Manual Therapy Soft Tissue Mobilization: Left low back, STW/M to left lumbar musculature to decrease pain and tone with pt in right side-lying for comfort     PATIENT EDUCATION:  Education details: HEP updates Person educated: Patient Education method: Explanation, Demonstration, and Handouts Education comprehension: verbalized understanding, returned demonstration, and needs further education  HOME EXERCISE  PROGRAM: Access Code: [email or web address] URL: [email or web address] Date: 03/24/2024 Prepared by: Julus Kelley April Earnie Starring  Exercises - CLX Ankle Dorsiflexion and Eversion  - 1 x daily - 7 x weekly - 3 sets - 10 reps - Seated Ankle Inversion with Resistance and Legs Crossed  - 1 x daily - 7 x weekly - 3 sets - 10 reps - Seated Ankle Eversion with Resistance  - 1 x daily - 7 x weekly - 3 sets - 10 reps - Modified Thomas Stretch  - 1 x daily - 7 x weekly - 2 sets - 30 sec hold - Supine Piriformis Stretch with Foot on Ground  - 1 x daily - 7 x weekly - 2 sets - 30 sec hold - Seated Piriformis Stretch  - 1 x daily - 7 x weekly - 2 sets - 30 sec hold - Clam with Resistance  - 1 x daily - 7 x weekly - 2 sets - 10 reps - Sidelying Reverse Clamshell with Resistance  - 1 x daily - 7 x weekly - 2 sets  - 10 reps - Side Plank on Knees  - 1 x daily - 7 x weekly - 3 sets - 10 sec hold  ASSESSMENT:  CLINICAL IMPRESSION: Continued to work on progressive strengthening. Continues to have spasm along L5 paraspinals addressed with manual therapy and stretching. Progressing pt's balance exercises.   OBJECTIVE IMPAIRMENTS: Abnormal gait, decreased activity tolerance, decreased balance, decreased endurance, decreased mobility, difficulty walking, decreased ROM, decreased strength, hypomobility, increased fascial restrictions, increased muscle spasms, impaired flexibility, improper body mechanics, postural dysfunction, and pain.   ACTIVITY LIMITATIONS: sitting, standing, squatting, stairs, transfers, and locomotion level  PARTICIPATION LIMITATIONS: driving, community activity, occupation, and yard work  PERSONAL FACTORS: Age, Fitness, Past/current experiences, and Time since onset of injury/illness/exacerbation are also affecting patient's functional outcome.   REHAB POTENTIAL: Good  CLINICAL DECISION MAKING: Evolving/moderate complexity  EVALUATION COMPLEXITY: Moderate   GOALS: Goals reviewed with patient? Yes  SHORT TERM GOALS: Target date: 04/10/2024   Pt will be ind with initial HEP Baseline: Goal status: MET  2.  Pt will be able to perform full LAQ without pain to demo improving neural tension Baseline:  Goal status: IN PROGRESS  3.  Pt will report decrease in symptoms by >/=25% Baseline:  Goal status: IN PROGRESS   LONG TERM GOALS: Target date: 05/08/2024   Pt will be ind with management and progression of HEP Baseline:  Goal status: INITIAL  2.  Pt will be able to perform SLS for 10 sec bilaterally to demo increasing LE strength/stability Baseline:  Goal status: INITIAL  3.  Pt will be able to perform L single leg heel raise to demo increasing ankle strength  Baseline:  Goal status: INITIAL  4.  Pt will report improved modified Oswestry score to </=5 to demo  MCID Baseline: 11 Goal status: INITIAL  5.  Pt will report improved symptoms by >/=50% Baseline:  Goal status: INITIAL   PLAN:  PT FREQUENCY: 1-2x/week  PT DURATION: 8 weeks  PLANNED INTERVENTIONS: 97164- PT Re-evaluation, 97750- Physical Performance Testing, 97110-Therapeutic exercises, 97530- Therapeutic activity, [id number]- Neuromuscular re-education, 97535- Self Care, 02859- Manual therapy, [phone]- Gait training, Patient/Family education, Balance training, Stair training, Cryotherapy, and Moist heat.  PLAN FOR NEXT SESSION: Assess response to HEP. Stretch hips. Incorporate neural glides. Strengthen ankle and L LE.    Shanaye Rief April Ma L Baylin Cabal, PT, DPT 04/27/2024, 8:14 AM

## 2024-05-04 ENCOUNTER — Ambulatory Visit: Admitting: Physical Therapy

## 2024-05-04 ENCOUNTER — Encounter: Payer: Self-pay | Admitting: Physical Therapy

## 2024-05-04 DIAGNOSIS — R29818 Other symptoms and signs involving the nervous system: Secondary | ICD-10-CM

## 2024-05-04 DIAGNOSIS — M5459 Other low back pain: Secondary | ICD-10-CM

## 2024-05-04 DIAGNOSIS — M6281 Muscle weakness (generalized): Secondary | ICD-10-CM | POA: Diagnosis not present

## 2024-05-04 DIAGNOSIS — R2689 Other abnormalities of gait and mobility: Secondary | ICD-10-CM

## 2024-05-04 DIAGNOSIS — M25552 Pain in left hip: Secondary | ICD-10-CM | POA: Diagnosis not present

## 2024-05-04 NOTE — Therapy (Signed)
 OUTPATIENT PHYSICAL THERAPY TREATMENT   Patient Name: Anthony Austin MRN: [id number] DOB:Aug 05, 1960, 63 y.o., male Today's Date: 05/04/2024  END OF SESSION:  PT End of Session - 05/04/24 0746     Visit Number 7    Number of Visits 8    Date for Recertification  05/08/24    Authorization Type BCBS    PT Start Time 0800    PT Stop Time 0840    PT Time Calculation (min) 40 min    Activity Tolerance Patient tolerated treatment well            Past Medical History:  Diagnosis Date   Diabetes mellitus without complication (HCC)    Heat exhaustion    Hyperlipidemia    Hypertension    Prostate CA (HCC) 2005   Past Surgical History:  Procedure Laterality Date   BACK SURGERY     COLONOSCOPY  07/31/2010   Dr. Debrah   PROSTATE SURGERY     PROSTATECTOMY  2005   PROSTATECTOMY     XI ROBOTIC LAPAROSCOPIC ASSISTED APPENDECTOMY N/A 02/08/2024   Procedure: APPENDECTOMY, ROBOT-ASSISTED, LAPAROSCOPIC;  Surgeon: Mavis Anes, MD;  Location: [facility];  Service: General;  Laterality: N/A;   Patient Active Problem List   Diagnosis Date Noted   Diabetic lipidosis (HCC) 03/14/2024   Acute appendicitis with localized peritonitis, without perforation, abscess, or gangrene 02/08/2024   S/P laparoscopic appendectomy 02/08/2024   Lumbar radiculopathy, acute 11/16/2023   Hyperlipidemia 08/11/2023   Diabetes mellitus treated with injections of non-insulin medication (HCC) 03/16/2023   Fatty liver 03/16/2023   BMI 29.0-29.9,adult 02/13/2019   Vitamin D  deficiency 02/13/2019   Paresthesia of both feet 02/13/2019   Claudication 09/02/2016   Essential hypertension 09/20/2015    PCP: Zollie Lowers, MD  REFERRING PROVIDER: Mavis Purchase, MD  REFERRING DIAG: M51.16 (ICD-10-CM) - Intervertebral disc disorders with radiculopathy, lumbar region  Rationale for Evaluation and Treatment: Rehabilitation  THERAPY DIAG:  Other low back pain  Pain in left hip  Other abnormalities of gait and  mobility  Muscle weakness (generalized)  Other symptoms and signs involving the nervous system  ONSET DATE: December 23, 2023  SUBJECTIVE:                                                                                                                                                                                           SUBJECTIVE STATEMENT: Pt reports no new complaints. States he was able to do some work on hills and did better than he thought he would.   PERTINENT HISTORY:  12/23/23 L L5-S1 lateral diskectomy and then appendix surgery within the past few  months HTN, diabetes   PAIN:  Are you having pain? Yes: NPRS scale: 2-3/10 Pain location: L low back and hip Pain description: numbness, burning Aggravating factors: walking, sitting Relieving factors: laying on back or pain medication  PRECAUTIONS: None  RED FLAGS: None   WEIGHT BEARING RESTRICTIONS: No  FALLS:  Has patient fallen in last 6 months? 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  DIAGNOSTIC FINDINGS:  MRI 11/14/23: IMPRESSION: 1. Likely subacute left foraminal disc protrusion at L5-S1 causing prominent impingement on the left L5 nerve roots. 2. Moderate right eccentric impingement at L4-5 due to spondylosis and degenerative disc disease. 3. Scattered descending and sigmoid colon diverticula.  PATIENT SURVEYS:  Modified Oswestry:  MODIFIED OSWESTRY DISABILITY SCALE  Date: 03/13/24 Score  Pain intensity 3 =  Pain medication provides me with moderate relief from  pain.  2. Personal care (washing, dressing, etc.) 0 =  I can take care of myself normally without causing increased pain.  3. Lifting 3 = Pain prevents me from lifting heavy weights, but I can manage (5) I have hardly any social life because of my pain. light to medium weights if they are conveniently positioned  4. Walking 1 = Pain prevents me from walking more than 1 mile.  5. Sitting 1 =  I can only sit in my favorite chair as long as I like.  6. Standing 1 =  I can stand as long as I want but, it increases my pain.  7. Sleeping 0 = Pain does not prevent me from sleeping well.  8. Social Life 0 = My social life is normal and does not increase my pain.  9. Traveling 1 =  I can travel anywhere, but it increases my pain.  10. Employment/ Homemaking 1 = My normal homemaking/job activities increase my pain, but I can still perform all that is required of me  Total 11/50   Interpretation of scores: Score Category Description  0-20% Minimal Disability The patient can cope with most living activities. Usually no treatment is indicated apart from advice on lifting, sitting and exercise  21-40% Moderate Disability The patient experiences more pain and difficulty with sitting, lifting and standing. Travel and social life are more difficult and they may be disabled from work. Personal care, sexual activity and sleeping are not grossly affected, and the patient can usually be managed by conservative means  41-60% Severe Disability Pain remains the main problem in this group, but activities of daily living are affected. These patients require a detailed investigation  61-80% Crippled Back pain impinges on all aspects of the patient's life. Positive intervention is required  81-100% Bed-bound  These patients are either bed-bound or exaggerating their symptoms  Bluford FORBES Zoe DELENA Karon DELENA, et al. Surgery versus conservative management of stable thoracolumbar fracture: the PRESTO feasibility RCT. [place]  ([place]): [employer]; 2021 Nov. [facility] Technology Assessment, No. 25.62.) Appendix 3, Oswestry Disability Index category descriptors. Available from: [email or web address].cz  Minimally Clinically Important Difference (MCID) = 12.8%   SENSATION: A little numbness on top of L foot  MUSCLE LENGTH: Hamstrings:  Right 60 deg; Left 45 deg Thomas test: WNL  PALPATION: TTP L hip flexor, L glute med and QL  LUMBAR ROM: Did not assess  LOWER EXTREMITY ROM:   L LAQ lacks ~10 deg of full extension (increases pain)   LOWER EXTREMITY MMT:    MMT Right eval Left eval  Hip flexion 5 5  Hip extension  5  Hip abduction  3+  Hip adduction    Hip internal rotation    Hip external rotation    Knee flexion 5 4  Knee extension 5 5  Ankle dorsiflexion 5 3+  Ankle plantarflexion 5 3+  Ankle inversion 5 3+  Ankle eversion 5 3+   (Blank rows = not tested)  LUMBAR SPECIAL TESTS:  Straight leg raise test: Positive, Slump test: Positive, and FABER test: Negative  FUNCTIONAL TESTS:  Eval:  SLS: R LE 9.63 sec, L LE 3.69 sec  05/04/24 SLS: R LE 31.63, L LE 6.5 sec  GAIT: Distance walked: Into clinic Assistive device utilized: None Level of assistance: Complete Independence Comments: slightly guarded with L stance phase   TREATMENT DATE:  05/04/24                                 EXERCISE LOG  Exercise Repetitions and Resistance Comments  Recumbent bike L3 x 10 min   Seated figure 4 stretch 2x30   Standing hamstring stretch  2x30   Side step green TB 3x20'   Fwd/bwd monster walk 3x20'   SLS 2x30   Single leg eccentric heel raise 2x10   Standing on blue side of bosu: ankle inv, ev To fatigue   Single leg, tap other foot fwd, side and back standing on airex x10    Blank cell = exercise not performed today    04/27/24                                 EXERCISE LOG  Exercise Repetitions and Resistance Comments  Nustep Lvl 6 x 10 mins   Seated figure 4  stretch 2x30   Seated piriformis stretch 2x30   Standing hamstring stretch  2x30   Sidelying clamshell green TB 2x10   Sidelying reverse green TB  2x10   Forward plank 2x30   Single leg heel raise 2x10   Slow march on airex 2x30   Single leg, tap other foot fwd, side and back standing on airex x10    Blank cell = exercise not performed today   Manual Therapy Soft Tissue Mobilization: Left low back, STW/M to left lumbar musculature to decrease pain and tone with pt in right side-lying for comfort    04/07/24                                 EXERCISE LOG  Exercise Repetitions and Resistance Comments  Nustep Lvl 6 x 10 mins   Rockerboard in ant/post position: Fwd/bwd Static balance 2X1 min each   Rockerboard in lateral position: Side to side Static balance 2X1 min each   Standing hamstring stretch  X30   Forward step tap 6 step 2x10   Side step tap 6 step 2x10   [place] 5x5    Blank cell = exercise not performed today     04/07/24  EXERCISE LOG  Exercise Repetitions and Resistance Comments  Nustep Lvl 3 x 13 mins   Rockerboard 3 mins   Forward Lunge 14 box x 3 mins each side   Lateral Step Ups 25 reps bil        Blank cell = exercise not performed today   Manual Therapy Soft Tissue Mobilization: Left low back, STW/M to left lumbar musculature to decrease pain and tone with pt in right side-lying for comfort     PATIENT EDUCATION:  Education details: HEP updates Person educated: Patient Education method: Explanation, Demonstration, and Handouts Education comprehension: verbalized understanding, returned demonstration, and needs further education  HOME EXERCISE PROGRAM: Access Code: [email or web address] URL: [email or web address] Date: 03/24/2024 Prepared by: Braison Snoke April Earnie Starring  Exercises - CLX Ankle Dorsiflexion and Eversion  - 1 x daily - 7 x weekly - 3 sets - 10 reps - Seated Ankle Inversion with  Resistance and Legs Crossed  - 1 x daily - 7 x weekly - 3 sets - 10 reps - Seated Ankle Eversion with Resistance  - 1 x daily - 7 x weekly - 3 sets - 10 reps - Modified Thomas Stretch  - 1 x daily - 7 x weekly - 2 sets - 30 sec hold - Supine Piriformis Stretch with Foot on Ground  - 1 x daily - 7 x weekly - 2 sets - 30 sec hold - Seated Piriformis Stretch  - 1 x daily - 7 x weekly - 2 sets - 30 sec hold - Clam with Resistance  - 1 x daily - 7 x weekly - 2 sets - 10 reps - Sidelying Reverse Clamshell with Resistance  - 1 x daily - 7 x weekly - 2 sets - 10 reps - Side Plank on Knees  - 1 x daily - 7 x weekly - 3 sets - 10 sec hold  ASSESSMENT:  CLINICAL IMPRESSION: Rechecked part of pt's goals. Pt has met his STGs and part of his LTGs. Still has continued L ankle weakness notable with his single leg stance but improved since eval. Does have some continued s/s of L5 radiculopathy. Focused on ankle strengthening in weight bearing. Pt will benefit from continued PT to improve overall L LE function.   OBJECTIVE IMPAIRMENTS: Abnormal gait, decreased activity tolerance, decreased balance, decreased endurance, decreased mobility, difficulty walking, decreased ROM, decreased strength, hypomobility, increased fascial restrictions, increased muscle spasms, impaired flexibility, improper body mechanics, postural dysfunction, and pain.   ACTIVITY LIMITATIONS: sitting, standing, squatting, stairs, transfers, and locomotion level  PARTICIPATION LIMITATIONS: driving, community activity, occupation, and yard work  PERSONAL FACTORS: Age, Fitness, Past/current experiences, and Time since onset of injury/illness/exacerbation are also affecting patient's functional outcome.   REHAB POTENTIAL: Good  CLINICAL DECISION MAKING: Evolving/moderate complexity  EVALUATION COMPLEXITY: Moderate   GOALS: Goals reviewed with patient? Yes  SHORT TERM GOALS: Target date: 04/10/2024   Pt will be ind with initial  HEP Baseline: Goal status: MET  2.  Pt will be able to perform full LAQ without pain to demo improving neural tension Baseline:  11/13: It feels like the left is looser than the right Goal status: MET  3.  Pt will report decrease in symptoms by >/=25% Baseline:  Goal status: IN PROGRESS   LONG TERM GOALS: Target date: 06/15/2024   Pt will be ind with management and progression of HEP Baseline:  Goal status: IN PROGRESS  2.  Pt will be able to perform SLS for 10 sec bilaterally to demo increasing  LE strength/stability Baseline:  11/13: 6.5 sec on L, >30 sec on R Goal status: IN PROGRESS  3.  Pt will be able to perform L single leg heel raise to demo increasing ankle strength  Baseline:  11/13: Able to perform 2x10 but not as high as R Goal status: MET  4.  Pt will report improved modified Oswestry score to </=5 to demo MCID Baseline: 11 Goal status: IN PROGRESS  5.  Pt will report improved symptoms by >/=50% Baseline:  Goal status: IN PROGRESS   PLAN:  PT FREQUENCY: 1-2x/week  PT DURATION: 8 weeks  PLANNED INTERVENTIONS: [id number]- PT Re-evaluation, 97750- Physical Performance Testing, 97110-Therapeutic exercises, 97530- Therapeutic activity, [id number]- Neuromuscular re-education, 97535- Self Care, [id number]- Manual therapy, [phone]- Gait training, Patient/Family education, Balance training, Stair training, Cryotherapy, and Moist heat.  PLAN FOR NEXT SESSION: Assess response to HEP. Stretch hips. Incorporate neural glides. Strengthen ankle and L LE.    Celicia Minahan April Ma L Ariea Rochin, PT, DPT 05/04/2024, 7:55 AM

## 2024-05-11 ENCOUNTER — Ambulatory Visit: Admitting: Physical Therapy

## 2024-05-11 DIAGNOSIS — R29818 Other symptoms and signs involving the nervous system: Secondary | ICD-10-CM | POA: Diagnosis not present

## 2024-05-11 DIAGNOSIS — M5459 Other low back pain: Secondary | ICD-10-CM | POA: Diagnosis not present

## 2024-05-11 DIAGNOSIS — M25552 Pain in left hip: Secondary | ICD-10-CM

## 2024-05-11 DIAGNOSIS — R2689 Other abnormalities of gait and mobility: Secondary | ICD-10-CM | POA: Diagnosis not present

## 2024-05-11 DIAGNOSIS — M6281 Muscle weakness (generalized): Secondary | ICD-10-CM

## 2024-05-11 NOTE — Therapy (Signed)
 OUTPATIENT PHYSICAL THERAPY TREATMENT   Patient Name: Anthony Austin MRN: [id number] DOB:[birth year], 63 y.o., male Today's Date: 05/11/2024  END OF SESSION:  PT End of Session - 05/11/24 1541     Visit Number 8    Number of Visits 15    Date for Recertification  06/15/24    Authorization Type BCBS    PT Start Time 1515    PT Stop Time 1555    PT Time Calculation (min) 40 min    Activity Tolerance Patient tolerated treatment well           Past Medical History:  Diagnosis Date   Diabetes mellitus without complication (HCC)    Heat exhaustion    Hyperlipidemia    Hypertension    Prostate CA (HCC) 2005   Past Surgical History:  Procedure Laterality Date   BACK SURGERY     COLONOSCOPY  07/31/2010   Dr. Debrah   PROSTATE SURGERY     PROSTATECTOMY  2005   PROSTATECTOMY     XI ROBOTIC LAPAROSCOPIC ASSISTED APPENDECTOMY N/A 02/08/2024   Procedure: APPENDECTOMY, ROBOT-ASSISTED, LAPAROSCOPIC;  Surgeon: Mavis Anes, MD;  Location: [facility];  Service: General;  Laterality: N/A;   Patient Active Problem List   Diagnosis Date Noted   Diabetic lipidosis (HCC) 03/14/2024   Acute appendicitis with localized peritonitis, without perforation, abscess, or gangrene 02/08/2024   S/P laparoscopic appendectomy 02/08/2024   Lumbar radiculopathy, acute 11/16/2023   Hyperlipidemia 08/11/2023   Diabetes mellitus treated with injections of non-insulin medication (HCC) 03/16/2023   Fatty liver 03/16/2023   BMI 29.0-29.9,adult 02/13/2019   Vitamin D  deficiency 02/13/2019   Paresthesia of both feet 02/13/2019   Claudication 09/02/2016   Essential hypertension 09/20/2015    PCP: Zollie Lowers, MD  REFERRING PROVIDER: Mavis Purchase, MD  REFERRING DIAG: M51.16 (ICD-10-CM) - Intervertebral disc disorders with radiculopathy, lumbar region  Rationale for Evaluation and Treatment: Rehabilitation  THERAPY DIAG:  No diagnosis found.  ONSET DATE: December 23, 2023  SUBJECTIVE:                                                                                                                                                                                            SUBJECTIVE STATEMENT: Pt states he's been working on his balance but continues to have a hard time. Pt reports he was able to lift and carry bags of corn with minimal back pain  PERTINENT HISTORY:  12/23/23 L L5-S1 lateral diskectomy and then appendix surgery within the past few months HTN, diabetes   PAIN:  Are you having pain? Yes: NPRS scale: 2-3/10 Pain location: L low back  and hip Pain description: numbness, burning Aggravating factors: walking, sitting Relieving factors: laying on back or pain medication  PRECAUTIONS: None  RED FLAGS: None   WEIGHT BEARING RESTRICTIONS: No  FALLS:  Has patient fallen in last 6 months? 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  DIAGNOSTIC FINDINGS:  MRI 11/14/23: IMPRESSION: 1. Likely subacute left foraminal disc protrusion at L5-S1 causing prominent impingement on the left L5 nerve roots. 2. Moderate right eccentric impingement at L4-5 due to spondylosis and degenerative disc disease. 3. Scattered descending and sigmoid colon diverticula.  PATIENT SURVEYS:  Modified Oswestry:  MODIFIED OSWESTRY DISABILITY SCALE  Date: 03/13/24 Score  Pain intensity 3 =  Pain medication provides me with moderate relief from pain.  2. Personal care (washing, dressing, etc.) 0 =  I can take care of myself normally without causing increased  pain.  3. Lifting 3 = Pain prevents me from lifting heavy weights, but I can manage (5) I have hardly any social life because of my pain. light to medium weights if they are conveniently positioned  4. Walking 1 = Pain prevents me from walking more than 1 mile.  5. Sitting 1 =  I can only sit in my favorite chair as long as I like.  6. Standing 1 =  I can stand as long as I want but, it increases my pain.  7. Sleeping 0 = Pain does not prevent me from sleeping well.  8. Social Life 0 = My social life is normal and does not increase my pain.  9. Traveling 1 =  I can travel anywhere, but it increases my pain.  10. Employment/ Homemaking 1 = My normal homemaking/job activities increase my pain, but I can still perform all that is required of me  Total 11/50   Interpretation of scores: Score Category Description  0-20% Minimal Disability The patient can cope with most living activities. Usually no treatment is indicated apart from advice on lifting, sitting and exercise  21-40% Moderate Disability The patient experiences more pain and difficulty with sitting, lifting and standing. Travel and social life are more difficult and they may be disabled from work. Personal care, sexual activity and sleeping are not grossly affected, and the patient can usually be managed by conservative means  41-60% Severe Disability Pain remains the main problem in this group, but activities of daily living are affected. These patients require a detailed investigation  61-80% Crippled Back pain impinges on all aspects of the patient's life. Positive intervention is required  81-100% Bed-bound  These patients are either bed-bound or exaggerating their symptoms  Bluford FORBES Zoe DELENA Karon DELENA, et al. Surgery versus conservative management of stable thoracolumbar fracture: the PRESTO feasibility RCT. [place] ([place]): [employer]; 2021 Nov. [facility] Technology Assessment, No. 25.62.) Appendix 3, Oswestry Disability  Index category descriptors. Available from: [email or web address].cz  Minimally Clinically Important Difference (MCID) = 12.8%   SENSATION: A little numbness on top of L foot  MUSCLE LENGTH: Hamstrings: Right 60 deg; Left 45 deg Thomas test: WNL  PALPATION: TTP L hip flexor, L glute med and QL  LUMBAR ROM: Did not assess  LOWER EXTREMITY ROM:   L LAQ lacks ~10 deg of full extension (increases pain)   LOWER EXTREMITY MMT:    MMT Right eval Left eval  Hip flexion 5 5  Hip extension  5  Hip abduction  3+  Hip adduction    Hip internal rotation    Hip external rotation    Knee flexion 5 4  Knee extension 5 5  Ankle dorsiflexion 5 3+  Ankle plantarflexion 5 3+  Ankle inversion 5 3+  Ankle eversion 5 3+   (Blank rows = not tested)  LUMBAR SPECIAL TESTS:  Straight leg raise test: Positive, Slump test: Positive, and FABER test: Negative  FUNCTIONAL TESTS:  Eval:  SLS: R LE 9.63 sec, L LE 3.69 sec  05/04/24 SLS: R LE 31.63, L LE 6.5 sec  GAIT: Distance walked: Into clinic Assistive device utilized: None Level of assistance: Complete Independence Comments: slightly guarded with L stance phase   TREATMENT DATE:  05/04/24                                 EXERCISE LOG  Exercise Repetitions and Resistance Comments  Recumbent bike L3 x 8 min   Single leg eccentric heel raise 2x10   R foot on dynadisc, squats 2x10   L foot on dynadisc, side step tap 4 box 2x10   L foot on dynadisc, fwd step tap 4 box 2x10   L foot on dynadisc, fwd backward tap 4 box 2x10   Standing on incline eyes closed head nods, head turns X10 each   Standing on decline eyes closed Head nods, head turns X10 each   Standing on blue side of bosu: ankle inv, ev, DF, PF 2x10 each        Blank cell = exercise not performed today    04/27/24                                 EXERCISE LOG  Exercise Repetitions and Resistance Comments  Nustep Lvl 6 x 10 mins   Seated figure  4 stretch 2x30   Seated piriformis stretch 2x30   Standing hamstring stretch  2x30   Sidelying clamshell green TB 2x10   Sidelying reverse green TB  2x10   Forward plank 2x30   Single leg heel raise 2x10   Slow march on airex 2x30   Single leg, tap other foot fwd, side and back standing on airex x10    Blank cell = exercise not performed today   Manual Therapy Soft Tissue Mobilization: Left low back, STW/M to left lumbar musculature to decrease pain and tone with pt in right side-lying for comfort    04/07/24                                 EXERCISE LOG  Exercise Repetitions and Resistance Comments  Nustep Lvl 6 x 10 mins   Rockerboard in ant/post position: Fwd/bwd Static balance 2X1 min each   Rockerboard in lateral position: Side to side Static balance 2X1 min each   Standing hamstring stretch  X30   Forward step tap 6 step 2x10   Side step tap 6 step 2x10   [place] 5x5    Blank cell = exercise not performed today  04/07/24                                 EXERCISE LOG  Exercise Repetitions and Resistance Comments  Nustep Lvl 3 x 13 mins   Rockerboard 3 mins   Forward Lunge 14 box x 3 mins each side   Lateral Step Ups 25 reps bil        Blank cell = exercise not performed today   Manual Therapy Soft Tissue Mobilization: Left low back, STW/M to left lumbar musculature to decrease pain and tone with pt in right side-lying for comfort     PATIENT EDUCATION:  Education details: HEP updates Person educated: Patient Education method: Explanation, Demonstration, and Handouts Education comprehension: verbalized understanding, returned demonstration, and needs further education  HOME EXERCISE PROGRAM: Access Code: [email or web address] URL: [email or web address] Date: 03/24/2024 Prepared by: Shaylen Nephew April Earnie Starring  Exercises - CLX Ankle Dorsiflexion and Eversion  - 1 x daily - 7 x weekly - 3 sets - 10 reps - Seated Ankle Inversion with  Resistance and Legs Crossed  - 1 x daily - 7 x weekly - 3 sets - 10 reps - Seated Ankle Eversion with Resistance  - 1 x daily - 7 x weekly - 3 sets - 10 reps - Modified Thomas Stretch  - 1 x daily - 7 x weekly - 2 sets - 30 sec hold - Supine Piriformis Stretch with Foot on Ground  - 1 x daily - 7 x weekly - 2 sets - 30 sec hold - Seated Piriformis Stretch  - 1 x daily - 7 x weekly - 2 sets - 30 sec hold - Clam with Resistance  - 1 x daily - 7 x weekly - 2 sets - 10 reps - Sidelying Reverse Clamshell with Resistance  - 1 x daily - 7 x weekly - 2 sets - 10 reps - Side Plank on Knees  - 1 x daily - 7 x weekly - 3 sets - 10 sec hold  ASSESSMENT:  CLINICAL IMPRESSION: Focus primarily on ankle/foot strength, balance and stability on compliant surfaces in full weight bearing. Challenged with maintaining stability with feet up on incline with focus on vestibular integration for balance. L LE fatigues and becomes numb; however, pt states when this happens he no longer gets his burning pain.   OBJECTIVE IMPAIRMENTS: Abnormal gait, decreased activity tolerance, decreased balance, decreased endurance, decreased mobility, difficulty walking, decreased ROM, decreased strength, hypomobility, increased fascial restrictions, increased muscle spasms, impaired flexibility, improper body mechanics, postural dysfunction, and pain.   ACTIVITY LIMITATIONS: sitting, standing, squatting, stairs, transfers, and locomotion level  PARTICIPATION LIMITATIONS: driving, community activity, occupation, and yard work  PERSONAL FACTORS: Age, Fitness, Past/current experiences, and Time since onset of injury/illness/exacerbation are also affecting patient's functional outcome.   REHAB POTENTIAL: Good  CLINICAL DECISION MAKING: Evolving/moderate complexity  EVALUATION COMPLEXITY: Moderate   GOALS: Goals reviewed with patient? Yes  SHORT TERM GOALS: Target date: 04/10/2024   Pt will be ind with initial  HEP Baseline: Goal status: MET  2.  Pt will be able to perform full LAQ without pain to demo improving neural tension Baseline:  11/13: It feels like the left is looser than the right Goal status: MET  3.  Pt will report decrease in symptoms by >/=25% Baseline:  Goal status: IN PROGRESS   LONG TERM GOALS: Target date: 06/15/2024   Pt will be ind with management and progression  of HEP Baseline:  Goal status: IN PROGRESS  2.  Pt will be able to perform SLS for 10 sec bilaterally to demo increasing LE strength/stability Baseline:  11/13: 6.5 sec on L, >30 sec on R Goal status: IN PROGRESS  3.  Pt will be able to perform L single leg heel raise to demo increasing ankle strength  Baseline:  11/13: Able to perform 2x10 but not as high as R Goal status: MET  4.  Pt will report improved modified Oswestry score to </=5 to demo MCID Baseline: 11 Goal status: IN PROGRESS  5.  Pt will report improved symptoms by >/=50% Baseline:  Goal status: IN PROGRESS   PLAN:  PT FREQUENCY: 1-2x/week  PT DURATION: 8 weeks  PLANNED INTERVENTIONS: [id number]- PT Re-evaluation, 97750- Physical Performance Testing, 97110-Therapeutic exercises, 97530- Therapeutic activity, [id number]- Neuromuscular re-education, 97535- Self Care, [id number]- Manual therapy, [phone]- Gait training, Patient/Family education, Balance training, Stair training, Cryotherapy, and Moist heat.  PLAN FOR NEXT SESSION: Assess response to HEP. Stretch hips. Incorporate neural glides. Strengthen ankle and L LE.    Baden Betsch April Ma L Anastasiya Gowin, PT, DPT 05/11/2024, 3:42 PM

## 2024-05-17 ENCOUNTER — Ambulatory Visit: Admitting: Physical Therapy

## 2024-05-25 ENCOUNTER — Encounter: Payer: Self-pay | Admitting: Physical Therapy

## 2024-05-25 ENCOUNTER — Ambulatory Visit: Attending: Neurosurgery | Admitting: Physical Therapy

## 2024-05-25 DIAGNOSIS — M25552 Pain in left hip: Secondary | ICD-10-CM | POA: Insufficient documentation

## 2024-05-25 DIAGNOSIS — M5459 Other low back pain: Secondary | ICD-10-CM | POA: Insufficient documentation

## 2024-05-25 DIAGNOSIS — R29818 Other symptoms and signs involving the nervous system: Secondary | ICD-10-CM | POA: Diagnosis not present

## 2024-05-25 DIAGNOSIS — M6281 Muscle weakness (generalized): Secondary | ICD-10-CM | POA: Insufficient documentation

## 2024-05-25 DIAGNOSIS — R2689 Other abnormalities of gait and mobility: Secondary | ICD-10-CM | POA: Diagnosis not present

## 2024-05-25 NOTE — Therapy (Signed)
 OUTPATIENT PHYSICAL THERAPY TREATMENT   Patient Name: Anthony Austin MRN: [id number] DOB:07/03/1960, 63 y.o., male Today's Date: 05/25/2024  END OF SESSION:  PT End of Session - 05/25/24 0810     Visit Number 9    Number of Visits 15    Date for Recertification  06/15/24    Authorization Type BCBS    PT Start Time 0800    PT Stop Time 0840    PT Time Calculation (min) 40 min    Activity Tolerance Patient tolerated treatment well            Past Medical History:  Diagnosis Date   Diabetes mellitus without complication (HCC)    Heat exhaustion    Hyperlipidemia    Hypertension    Prostate CA (HCC) 2005   Past Surgical History:  Procedure Laterality Date   BACK SURGERY     COLONOSCOPY  07/31/2010   Dr. Debrah   PROSTATE SURGERY     PROSTATECTOMY  2005   PROSTATECTOMY     XI ROBOTIC LAPAROSCOPIC ASSISTED APPENDECTOMY N/A 02/08/2024   Procedure: APPENDECTOMY, ROBOT-ASSISTED, LAPAROSCOPIC;  Surgeon: Mavis Anes, MD;  Location: [facility];  Service: General;  Laterality: N/A;   Patient Active Problem List   Diagnosis Date Noted   Diabetic lipidosis (HCC) 03/14/2024   Acute appendicitis with localized peritonitis, without perforation, abscess, or gangrene 02/08/2024   S/P laparoscopic appendectomy 02/08/2024   Lumbar radiculopathy, acute 11/16/2023   Hyperlipidemia 08/11/2023   Diabetes mellitus treated with injections of non-insulin medication (HCC) 03/16/2023   Fatty liver 03/16/2023   BMI 29.0-29.9,adult 02/13/2019   Vitamin D  deficiency 02/13/2019   Paresthesia of both feet 02/13/2019   Claudication 09/02/2016   Essential hypertension 09/20/2015    PCP: Zollie Lowers, MD  REFERRING PROVIDER: Mavis Purchase, MD  REFERRING DIAG: M51.16 (ICD-10-CM) - Intervertebral disc disorders with radiculopathy, lumbar region  Rationale for Evaluation and Treatment: Rehabilitation  THERAPY DIAG:  Other low back pain  Pain in left hip  Other abnormalities of gait and  mobility  Muscle weakness (generalized)  ONSET DATE: December 23, 2023  SUBJECTIVE:                                                                                                                                                                                           SUBJECTIVE STATEMENT: Pt reports his L ankle rolled on him while going down the steps. Fell on his L side and hip. Some ankle soreness but no swelling or bruising  PERTINENT HISTORY:  12/23/23 L L5-S1 lateral diskectomy and then appendix surgery within the past few months HTN, diabetes  PAIN:  Are you having pain? Yes: NPRS scale: 2-3/10 Pain location: L low back and hip Pain description: numbness, burning Aggravating factors: walking, sitting Relieving factors: laying on back or pain medication  PRECAUTIONS: None  RED FLAGS: None   WEIGHT BEARING RESTRICTIONS: No  FALLS:  Has patient fallen in last 6 months? 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  DIAGNOSTIC FINDINGS:  MRI 11/14/23: IMPRESSION: 1. Likely subacute left foraminal disc protrusion at L5-S1 causing prominent impingement on the left L5 nerve roots. 2. Moderate right eccentric impingement at L4-5 due to spondylosis and degenerative disc disease. 3. Scattered descending and sigmoid colon diverticula.  PATIENT SURVEYS:  Modified Oswestry:  MODIFIED OSWESTRY DISABILITY SCALE  Date: 03/13/24 Score  Pain intensity 3 =  Pain medication provides me with moderate relief from pain.  2. Personal care  (washing, dressing, etc.) 0 =  I can take care of myself normally without causing increased pain.  3. Lifting 3 = Pain prevents me from lifting heavy weights, but I can manage (5) I have hardly any social life because of my pain. light to medium weights if they are conveniently positioned  4. Walking 1 = Pain prevents me from walking more than 1 mile.  5. Sitting 1 =  I can only sit in my favorite chair as long as I like.  6. Standing 1 =  I can stand as long as I want but, it increases my pain.  7. Sleeping 0 = Pain does not prevent me from sleeping well.  8. Social Life 0 = My social life is normal and does not increase my pain.  9. Traveling 1 =  I can travel anywhere, but it increases my pain.  10. Employment/ Homemaking 1 = My normal homemaking/job activities increase my pain, but I can still perform all that is required of me  Total 11/50   Interpretation of scores: Score Category Description  0-20% Minimal Disability The patient can cope with most living activities. Usually no treatment is indicated apart from advice on lifting, sitting and exercise  21-40% Moderate Disability The patient experiences more pain and difficulty with sitting, lifting and standing. Travel and social life are more difficult and they may be disabled from work. Personal care, sexual activity and sleeping are not grossly affected, and the patient can usually be managed by conservative means  41-60% Severe Disability Pain remains the main problem in this group, but activities of daily living are affected. These patients require a detailed investigation  61-80% Crippled Back pain impinges on all aspects of the patient's life. Positive intervention is required  81-100% Bed-bound  These patients are either bed-bound or exaggerating their symptoms  Bluford FORBES Zoe DELENA Karon DELENA, et al. Surgery versus conservative management of stable thoracolumbar fracture: the PRESTO feasibility RCT. [place] ([place]): [employer]; 2021 Nov. [facility] Technology Assessment, No. 25.62.) Appendix 3, Oswestry Disability Index category descriptors. Available from: [email or web address].cz  Minimally Clinically Important Difference (MCID) = 12.8%   SENSATION: A little numbness on top of L foot  MUSCLE LENGTH: Hamstrings: Right 60 deg; Left 45  deg Debby test: WNL  PALPATION: TTP L hip flexor, L glute med and QL  LUMBAR ROM: Did not assess  LOWER EXTREMITY ROM:   L LAQ lacks ~10 deg of full extension (increases pain)   LOWER EXTREMITY MMT:    MMT Right eval Left eval  Hip flexion 5 5  Hip extension  5  Hip abduction  3+  Hip adduction    Hip internal rotation    Hip external rotation    Knee flexion 5 4  Knee extension 5 5  Ankle dorsiflexion 5 3+  Ankle plantarflexion 5 3+  Ankle inversion 5 3+  Ankle eversion 5 3+   (Blank rows = not tested)  LUMBAR SPECIAL TESTS:  Straight leg raise test: Positive, Slump test: Positive, and FABER test: Negative  FUNCTIONAL TESTS:  Eval:  SLS: R LE 9.63 sec, L LE 3.69 sec  05/04/24 SLS: R LE 31.63, L LE 6.5 sec  GAIT: Distance walked: Into clinic Assistive device utilized: None Level of assistance: Complete Independence Comments: slightly guarded with L stance phase   TREATMENT DATE:  05/25/24                                 EXERCISE LOG  Exercise Repetitions and Resistance Comments  Recumbent bike L3 x 10 min   SLS foot on 2 box, step tap fwd and backward 2x10   Lunge on 2 box into internal rotation 2x10   Lunge on 2 box with diagonals 4# 2x10   Lunge with leg pressing out into ball on Mccroskey 2x10   Captain morgans leg pressing out into ball on Aiken 2x10   Standing on 2 box hip hike with glute med setting 2X10 each                Blank cell = exercise not performed today    05/04/24                                 EXERCISE LOG  Exercise Repetitions and Resistance Comments  Recumbent bike L3 x 8 min   Single  leg eccentric heel raise 2x10   R foot on dynadisc, squats 2x10   L foot on dynadisc, side step tap 4 box 2x10   L foot on dynadisc, fwd step tap 4 box 2x10   L foot on dynadisc, fwd backward tap 4 box 2x10   Standing on incline eyes closed head nods, head turns X10 each   Standing on decline eyes closed Head nods, head turns X10 each   Standing on blue side of bosu: ankle inv, ev, DF, PF 2x10 each        Blank cell = exercise not performed today    04/27/24                                 EXERCISE LOG  Exercise Repetitions and Resistance Comments  Nustep Lvl 6 x 10 mins   Seated figure 4 stretch 2x30   Seated piriformis stretch 2x30   Standing hamstring stretch  2x30   Sidelying clamshell green TB 2x10   Sidelying reverse green TB  2x10   Forward plank 2x30   Single leg heel raise 2x10   Slow march on airex 2x30   Single leg, tap other foot  fwd, side and back standing on airex x10    Blank cell = exercise not performed today   Manual Therapy Soft Tissue Mobilization: Left low back, STW/M to left lumbar musculature to decrease pain and tone with pt in right side-lying for comfort    04/07/24                                 EXERCISE LOG  Exercise Repetitions and Resistance Comments  Nustep Lvl 6 x 10 mins   Rockerboard in ant/post position: Fwd/bwd Static balance 2X1 min each   Rockerboard in lateral position: Side to side Static balance 2X1 min each   Standing hamstring stretch  X30   Forward step tap 6 step 2x10   Side step tap 6 step 2x10   Captain Morgan 5x5    Blank cell = exercise not performed today     04/07/24                                 EXERCISE LOG  Exercise Repetitions and Resistance Comments  Nustep Lvl 3 x 13 mins   Rockerboard 3 mins   Forward Lunge 14 box x 3 mins each side   Lateral Step Ups 25 reps bil        Blank cell = exercise not performed today   Manual Therapy Soft Tissue Mobilization: Left low back, STW/M to left  lumbar musculature to decrease pain and tone with pt in right side-lying for comfort     PATIENT EDUCATION:  Education details: HEP updates Person educated: Patient Education method: Explanation, Demonstration, and Handouts Education comprehension: verbalized understanding, returned demonstration, and needs further education  HOME EXERCISE PROGRAM: Access Code: [email or web address] URL: [email or web address] Date: 03/24/2024 Prepared by: Kayan Blissett April Earnie Starring  Exercises - CLX Ankle Dorsiflexion and Eversion  - 1 x daily - 7 x weekly - 3 sets - 10 reps - Seated Ankle Inversion with Resistance and Legs Crossed  - 1 x daily - 7 x weekly - 3 sets - 10 reps - Seated Ankle Eversion with Resistance  - 1 x daily - 7 x weekly - 3 sets - 10 reps - Modified Thomas Stretch  - 1 x daily - 7 x weekly - 2 sets - 30 sec hold - Supine Piriformis Stretch with Foot on Ground  - 1 x daily - 7 x weekly - 2 sets - 30 sec hold - Seated Piriformis Stretch  - 1 x daily - 7 x weekly - 2 sets - 30 sec hold - Clam with Resistance  - 1 x daily - 7 x weekly - 2 sets - 10 reps - Sidelying Reverse Clamshell with Resistance  - 1 x daily - 7 x weekly - 2 sets - 10 reps - Side Plank on Knees  - 1 x daily - 7 x weekly - 3 sets - 10 sec hold  ASSESSMENT:  CLINICAL IMPRESSION: Due to recent fall, did not perform balance on compliant surfaces today to allow ankle more time to heal. Performed mostly single leg stabilizing activities with focus on core and glute strengthening. Highly challenged with hip hiking with glute med setting.   OBJECTIVE IMPAIRMENTS: Abnormal gait, decreased activity tolerance, decreased balance, decreased endurance, decreased mobility, difficulty walking, decreased ROM, decreased strength, hypomobility, increased fascial restrictions, increased muscle spasms, impaired flexibility, improper body mechanics, postural dysfunction, and  pain.   ACTIVITY LIMITATIONS: sitting, standing, squatting,  stairs, transfers, and locomotion level  PARTICIPATION LIMITATIONS: driving, community activity, occupation, and yard work  PERSONAL FACTORS: Age, Fitness, Past/current experiences, and Time since onset of injury/illness/exacerbation are also affecting patient's functional outcome.   REHAB POTENTIAL: Good  CLINICAL DECISION MAKING: Evolving/moderate complexity  EVALUATION COMPLEXITY: Moderate   GOALS: Goals reviewed with patient? Yes  SHORT TERM GOALS: Target date: 04/10/2024   Pt will be ind with initial HEP Baseline: Goal status: MET  2.  Pt will be able to perform full LAQ without pain to demo improving neural tension Baseline:  11/13: It feels like the left is looser than the right Goal status: MET  3.  Pt will report decrease in symptoms by >/=25% Baseline:  Goal status: IN PROGRESS   LONG TERM GOALS: Target date: 06/15/2024   Pt will be ind with management and progression of HEP Baseline:  Goal status: IN PROGRESS  2.  Pt will be able to perform SLS for 10 sec bilaterally to demo increasing LE strength/stability Baseline:  11/13: 6.5 sec on L, >30 sec on R Goal status: IN PROGRESS  3.  Pt will be able to perform L single leg heel raise to demo increasing ankle strength  Baseline:  11/13: Able to perform 2x10 but not as high as R Goal status: MET  4.  Pt will report improved modified Oswestry score to </=5 to demo MCID Baseline: 11 Goal status: IN PROGRESS  5.  Pt will report improved symptoms by >/=50% Baseline:  Goal status: IN PROGRESS   PLAN:  PT FREQUENCY: 1-2x/week  PT DURATION: 8 weeks  PLANNED INTERVENTIONS: [id number]- PT Re-evaluation, 97750- Physical Performance Testing, 97110-Therapeutic exercises, 97530- Therapeutic activity, [id number]- Neuromuscular re-education, 97535- Self Care, [id number]- Manual therapy, [phone]- Gait training, Patient/Family education, Balance training, Stair training, Cryotherapy, and Moist heat.  PLAN FOR NEXT SESSION:  Assess response to HEP. Stretch hips. Incorporate neural glides. Strengthen ankle and L LE.    Janice Bodine April Ma L Janaisa Birkland, PT, DPT 05/25/2024, 8:11 AM

## 2024-06-01 ENCOUNTER — Encounter: Payer: Self-pay | Admitting: Physical Therapy

## 2024-06-01 ENCOUNTER — Ambulatory Visit: Admitting: Physical Therapy

## 2024-06-01 DIAGNOSIS — R2689 Other abnormalities of gait and mobility: Secondary | ICD-10-CM | POA: Diagnosis not present

## 2024-06-01 DIAGNOSIS — M6281 Muscle weakness (generalized): Secondary | ICD-10-CM | POA: Diagnosis not present

## 2024-06-01 DIAGNOSIS — R29818 Other symptoms and signs involving the nervous system: Secondary | ICD-10-CM | POA: Diagnosis not present

## 2024-06-01 DIAGNOSIS — M5459 Other low back pain: Secondary | ICD-10-CM

## 2024-06-01 DIAGNOSIS — M25552 Pain in left hip: Secondary | ICD-10-CM

## 2024-06-01 NOTE — Therapy (Signed)
 OUTPATIENT PHYSICAL THERAPY TREATMENT   Patient Name: GOODWIN KAMPHAUS MRN: [id number] DOB:[birth year], 63 y.o., male Today's Date: 06/01/2024  END OF SESSION:  PT End of Session - 06/01/24 0755     Visit Number 10    Number of Visits 15    Date for Recertification  06/15/24    Authorization Type BCBS    PT Start Time 0800    PT Stop Time 0840    PT Time Calculation (min) 40 min    Activity Tolerance Patient tolerated treatment well           Past Medical History:  Diagnosis Date   Diabetes mellitus without complication (HCC)    Heat exhaustion    Hyperlipidemia    Hypertension    Prostate CA (HCC) 2005   Past Surgical History:  Procedure Laterality Date   BACK SURGERY     COLONOSCOPY  07/31/2010   Dr. Debrah   PROSTATE SURGERY     PROSTATECTOMY  2005   PROSTATECTOMY     XI ROBOTIC LAPAROSCOPIC ASSISTED APPENDECTOMY N/A 02/08/2024   Procedure: APPENDECTOMY, ROBOT-ASSISTED, LAPAROSCOPIC;  Surgeon: Mavis Anes, MD;  Location: [facility];  Service: General;  Laterality: N/A;   Patient Active Problem List   Diagnosis Date Noted   Diabetic lipidosis (HCC) 03/14/2024   Acute appendicitis with localized peritonitis, without perforation, abscess, or gangrene 02/08/2024   S/P laparoscopic appendectomy 02/08/2024   Lumbar radiculopathy, acute 11/16/2023   Hyperlipidemia 08/11/2023   Diabetes mellitus treated with injections of non-insulin medication (HCC) 03/16/2023   Fatty liver 03/16/2023   BMI 29.0-29.9,adult 02/13/2019   Vitamin D  deficiency 02/13/2019   Paresthesia of both feet 02/13/2019   Claudication 09/02/2016   Essential hypertension 09/20/2015    PCP: Zollie Lowers, MD  REFERRING PROVIDER: Mavis Purchase, MD  REFERRING DIAG: M51.16 (ICD-10-CM) - Intervertebral disc disorders with radiculopathy, lumbar region  Rationale for Evaluation and Treatment: Rehabilitation  THERAPY DIAG:  Other low back pain  Pain in left hip  Other abnormalities of gait and  mobility  Muscle weakness (generalized)  ONSET DATE: December 23, 2023  SUBJECTIVE:                                                                                                                                                                                           SUBJECTIVE STATEMENT: Pt states his ankle is feeling back to normal.   PERTINENT HISTORY:  12/23/23 L L5-S1 lateral diskectomy and then appendix surgery within the past few months HTN, diabetes   PAIN:  Are you having pain? Yes: NPRS scale: 2-3/10 Pain location: L low back and hip Pain description:  numbness, burning Aggravating factors: walking, sitting Relieving factors: laying on back or pain medication  PRECAUTIONS: None  RED FLAGS: None   WEIGHT BEARING RESTRICTIONS: No  FALLS:  Has patient fallen in last 6 months? 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  DIAGNOSTIC FINDINGS:  MRI 11/14/23: IMPRESSION: 1. Likely subacute left foraminal disc protrusion at L5-S1 causing prominent impingement on the left L5 nerve roots. 2. Moderate right eccentric impingement at L4-5 due to spondylosis and degenerative disc disease. 3. Scattered descending and sigmoid colon diverticula.  PATIENT SURVEYS:  Modified Oswestry:  MODIFIED OSWESTRY DISABILITY SCALE  Date: 03/13/24 Score  Pain intensity 3 =  Pain medication provides me with moderate relief from pain.  2. Personal care (washing, dressing, etc.) 0 =  I can take care of myself normally without causing increased pain.   3. Lifting 3 = Pain prevents me from lifting heavy weights, but I can manage (5) I have hardly any social life because of my pain. light to medium weights if they are conveniently positioned  4. Walking 1 = Pain prevents me from walking more than 1 mile.  5. Sitting 1 =  I can only sit in my favorite chair as long as I like.  6. Standing 1 =  I can stand as long as I want but, it increases my pain.  7. Sleeping 0 = Pain does not prevent me from sleeping well.  8. Social Life 0 = My social life is normal and does not increase my pain.  9. Traveling 1 =  I can travel anywhere, but it increases my pain.  10. Employment/ Homemaking 1 = My normal homemaking/job activities increase my pain, but I can still perform all that is required of me  Total 11/50   Interpretation of scores: Score Category Description  0-20% Minimal Disability The patient can cope with most living activities. Usually no treatment is indicated apart from advice on lifting, sitting and exercise  21-40% Moderate Disability The patient experiences more pain and difficulty with sitting, lifting and standing. Travel and social life are more difficult and they may be disabled from work. Personal care, sexual activity and sleeping are not grossly affected, and the patient can usually be managed by conservative means  41-60% Severe Disability Pain remains the main problem in this group, but activities of daily living are affected. These patients require a detailed investigation  61-80% Crippled Back pain impinges on all aspects of the patients life. Positive intervention is required  81-100% Bed-bound  These patients are either bed-bound or exaggerating their symptoms  Bluford FORBES Zoe DELENA Karon DELENA, et al. Surgery versus conservative management of stable thoracolumbar fracture: the PRESTO feasibility RCT. [place] ([place]): [employer]; 2021 Nov. [facility] Technology Assessment, No. 25.62.) Appendix 3, Oswestry Disability Index  category descriptors. Available from: Findjewelers.cz  Minimally Clinically Important Difference (MCID) = 12.8%   SENSATION: A little numbness on top of L foot  MUSCLE LENGTH: Hamstrings: Right 60 deg; Left 45 deg Thomas test: WNL  PALPATION: TTP L hip flexor, L glute med and QL  LUMBAR ROM: Did  not assess  LOWER EXTREMITY ROM:   L LAQ lacks ~10 deg of full extension (increases pain)   LOWER EXTREMITY MMT:    MMT Right eval Left eval  Hip flexion 5 5  Hip extension  5  Hip abduction  3+  Hip adduction    Hip internal rotation    Hip external rotation    Knee flexion 5 4  Knee extension 5 5  Ankle dorsiflexion 5 3+  Ankle plantarflexion 5 3+  Ankle inversion 5 3+  Ankle eversion 5 3+   (Blank rows = not tested)  LUMBAR SPECIAL TESTS:  Straight leg raise test: Positive, Slump test: Positive, and FABER test: Negative  FUNCTIONAL TESTS:  Eval:  SLS: R LE 9.63 sec, L LE 3.69 sec  05/04/24 SLS: R LE 31.63, L LE 6.5 sec  GAIT: Distance walked: Into clinic Assistive device utilized: None Level of assistance: Complete Independence Comments: slightly guarded with L stance phase   TREATMENT DATE:  06/01/24                                 EXERCISE LOG  Exercise Repetitions and Resistance Comments  Recumbent bike L3 x 10 min   Front plank 2x30 Feet/forearms  Side plank 2x30 R&L; forearm/knees  Bird dog  2x10   Quadruped hip hike into fire hydrant 2x10   Standing on green theraband foam hip hike with glute med setting 2x10   Standing on blue side of bosu ankle inv/ev/df/pf 2x10each                                Blank cell = exercise not performed today    05/25/24                                 EXERCISE LOG  Exercise Repetitions and Resistance Comments  Recumbent bike L3 x 10 min   SLS foot on 2 box, step tap fwd and backward 2x10   Lunge on 2 box into internal rotation 2x10   Lunge on 2 box with diagonals 4# 2x10    Lunge with leg pressing out into ball on Myren 2x10   Captain morgans leg pressing out into ball on Durrell 2x10   Standing on 2 box hip hike with glute med setting 2X10 each                Blank cell = exercise not performed today    05/04/24                                 EXERCISE LOG  Exercise Repetitions and Resistance Comments  Recumbent bike L3 x 8 min   Single leg eccentric heel raise 2x10   R foot on dynadisc, squats 2x10   L foot on dynadisc, side step tap 4 box 2x10   L foot on dynadisc, fwd step tap 4 box 2x10   L foot on dynadisc, fwd backward tap 4 box 2x10   Standing on incline eyes closed head nods, head turns X10 each   Standing on decline eyes closed Head nods, head turns X10 each   Standing on blue side of bosu: ankle inv, ev, DF, PF 2x10 each  Blank cell = exercise not performed today    04/27/24                                 EXERCISE LOG  Exercise Repetitions and Resistance Comments  Nustep Lvl 6 x 10 mins   Seated figure 4 stretch 2x30   Seated piriformis stretch 2x30   Standing hamstring stretch  2x30   Sidelying clamshell green TB 2x10   Sidelying reverse green TB  2x10   Forward plank 2x30   Single leg heel raise 2x10   Slow march on airex 2x30   Single leg, tap other foot fwd, side and back standing on airex x10    Blank cell = exercise not performed today   Manual Therapy Soft Tissue Mobilization: Left low back, STW/M to left lumbar musculature to decrease pain and tone with pt in right side-lying for comfort    04/07/24                                 EXERCISE LOG  Exercise Repetitions and Resistance Comments  Nustep Lvl 6 x 10 mins   Rockerboard in ant/post position: Fwd/bwd Static balance 2X1 min each   Rockerboard in lateral position: Side to side Static balance 2X1 min each   Standing hamstring stretch  X30   Forward step tap 6 step 2x10   Side step tap 6 step 2x10   Captain Morgan 5x5    Blank cell =  exercise not performed today     04/07/24                                 EXERCISE LOG  Exercise Repetitions and Resistance Comments  Nustep Lvl 3 x 13 mins   Rockerboard 3 mins   Forward Lunge 14 box x 3 mins each side   Lateral Step Ups 25 reps bil        Blank cell = exercise not performed today   Manual Therapy Soft Tissue Mobilization: Left low back, STW/M to left lumbar musculature to decrease pain and tone with pt in right side-lying for comfort     PATIENT EDUCATION:  Education details: HEP updates Person educated: Patient Education method: Explanation, Demonstration, and Handouts Education comprehension: verbalized understanding, returned demonstration, and needs further education  HOME EXERCISE PROGRAM: Access Code: [email or web address] URL: [email or web address] Date: 03/24/2024 Prepared by: Brandis Wixted April Earnie Starring  Exercises - CLX Ankle Dorsiflexion and Eversion  - 1 x daily - 7 x weekly - 3 sets - 10 reps - Seated Ankle Inversion with Resistance and Legs Crossed  - 1 x daily - 7 x weekly - 3 sets - 10 reps - Seated Ankle Eversion with Resistance  - 1 x daily - 7 x weekly - 3 sets - 10 reps - Modified Thomas Stretch  - 1 x daily - 7 x weekly - 2 sets - 30 sec hold - Supine Piriformis Stretch with Foot on Ground  - 1 x daily - 7 x weekly - 2 sets - 30 sec hold - Seated Piriformis Stretch  - 1 x daily - 7 x weekly - 2 sets - 30 sec hold - Clam with Resistance  - 1 x daily - 7 x weekly - 2 sets - 10 reps - Sidelying Reverse Clamshell with  Resistance  - 1 x daily - 7 x weekly - 2 sets - 10 reps - Side Plank on Knees  - 1 x daily - 7 x weekly - 3 sets - 10 sec hold  ASSESSMENT:  CLINICAL IMPRESSION: Able to resume ankle, hip and core strengthening along pt's L side. Improving glute med setting with hip hike today -- able to perform on a more compliant surface.   OBJECTIVE IMPAIRMENTS: Abnormal gait, decreased activity tolerance, decreased balance, decreased  endurance, decreased mobility, difficulty walking, decreased ROM, decreased strength, hypomobility, increased fascial restrictions, increased muscle spasms, impaired flexibility, improper body mechanics, postural dysfunction, and pain.   ACTIVITY LIMITATIONS: sitting, standing, squatting, stairs, transfers, and locomotion level  PARTICIPATION LIMITATIONS: driving, community activity, occupation, and yard work  PERSONAL FACTORS: Age, Fitness, Past/current experiences, and Time since onset of injury/illness/exacerbation are also affecting patient's functional outcome.   REHAB POTENTIAL: Good  CLINICAL DECISION MAKING: Evolving/moderate complexity  EVALUATION COMPLEXITY: Moderate   GOALS: Goals reviewed with patient? Yes  SHORT TERM GOALS: Target date: 04/10/2024   Pt will be ind with initial HEP Baseline: Goal status: MET  2.  Pt will be able to perform full LAQ without pain to demo improving neural tension Baseline:  11/13: It feels like the left is looser than the right Goal status: MET  3.  Pt will report decrease in symptoms by >/=25% Baseline:  Goal status: IN PROGRESS   LONG TERM GOALS: Target date: 06/15/2024   Pt will be ind with management and progression of HEP Baseline:  Goal status: IN PROGRESS  2.  Pt will be able to perform SLS for 10 sec bilaterally to demo increasing LE strength/stability Baseline:  11/13: 6.5 sec on L, >30 sec on R Goal status: IN PROGRESS  3.  Pt will be able to perform L single leg heel raise to demo increasing ankle strength  Baseline:  11/13: Able to perform 2x10 but not as high as R Goal status: MET  4.  Pt will report improved modified Oswestry score to </=5 to demo MCID Baseline: 11 Goal status: IN PROGRESS  5.  Pt will report improved symptoms by >/=50% Baseline:  Goal status: IN PROGRESS   PLAN:  PT FREQUENCY: 1-2x/week  PT DURATION: 8 weeks  PLANNED INTERVENTIONS: [id number]- PT Re-evaluation, 97750- Physical  Performance Testing, 97110-Therapeutic exercises, 97530- Therapeutic activity, [id number]- Neuromuscular re-education, 97535- Self Care, [id number]- Manual therapy, [phone]- Gait training, Patient/Family education, Balance training, Stair training, Cryotherapy, and Moist heat.  PLAN FOR NEXT SESSION: Assess response to HEP. Stretch hips. Incorporate neural glides. Strengthen ankle and L LE.    Adeliz Tonkinson April Ma L Fernandez Kenley, PT, DPT 06/01/2024, 7:55 AM

## 2024-06-02 ENCOUNTER — Other Ambulatory Visit: Payer: Self-pay | Admitting: Family Medicine

## 2024-06-02 DIAGNOSIS — I10 Essential (primary) hypertension: Secondary | ICD-10-CM

## 2024-06-07 ENCOUNTER — Ambulatory Visit: Payer: Self-pay | Admitting: Family Medicine

## 2024-06-07 ENCOUNTER — Encounter: Payer: Self-pay | Admitting: Family Medicine

## 2024-06-07 ENCOUNTER — Other Ambulatory Visit: Payer: Self-pay | Admitting: Family Medicine

## 2024-06-07 VITALS — BP 148/83 | HR 60 | Temp 97.8°F | Ht 67.0 in | Wt 199.0 lb

## 2024-06-07 DIAGNOSIS — E785 Hyperlipidemia, unspecified: Secondary | ICD-10-CM

## 2024-06-07 DIAGNOSIS — I152 Hypertension secondary to endocrine disorders: Secondary | ICD-10-CM

## 2024-06-07 DIAGNOSIS — R202 Paresthesia of skin: Secondary | ICD-10-CM

## 2024-06-07 DIAGNOSIS — E1169 Type 2 diabetes mellitus with other specified complication: Secondary | ICD-10-CM | POA: Diagnosis not present

## 2024-06-07 DIAGNOSIS — Z7984 Long term (current) use of oral hypoglycemic drugs: Secondary | ICD-10-CM

## 2024-06-07 DIAGNOSIS — E1159 Type 2 diabetes mellitus with other circulatory complications: Secondary | ICD-10-CM

## 2024-06-07 DIAGNOSIS — E756 Lipid storage disorder, unspecified: Secondary | ICD-10-CM | POA: Diagnosis not present

## 2024-06-07 LAB — COMPREHENSIVE METABOLIC PANEL WITH GFR
ALT: 51 IU/L — ABNORMAL HIGH (ref 0–44)
AST: 43 IU/L — ABNORMAL HIGH (ref 0–40)
Albumin: 4.4 g/dL (ref 3.9–4.9)
Alkaline Phosphatase: 88 IU/L (ref 47–123)
BUN/Creatinine Ratio: 13 (ref 10–24)
BUN: 14 mg/dL (ref 8–27)
Bilirubin Total: 0.8 mg/dL (ref 0.0–1.2)
CO2: 28 mmol/L (ref 20–29)
Calcium: 9.7 mg/dL (ref 8.6–10.2)
Chloride: 98 mmol/L (ref 96–106)
Creatinine, Ser: 1.09 mg/dL (ref 0.76–1.27)
Globulin, Total: 2.1 g/dL (ref 1.5–4.5)
Glucose: 176 mg/dL — ABNORMAL HIGH (ref 70–99)
Potassium: 4.4 mmol/L (ref 3.5–5.2)
Sodium: 138 mmol/L (ref 134–144)
Total Protein: 6.5 g/dL (ref 6.0–8.5)
eGFR: 76 mL/min/1.73 (ref >59)

## 2024-06-07 LAB — LIPID PANEL
Chol/HDL Ratio: 3.2 ratio (ref 0.0–5.0)
Cholesterol, Total: 103 mg/dL (ref 100–199)
HDL: 32 mg/dL — ABNORMAL LOW (ref >39)
LDL Chol Calc (NIH): 49 mg/dL (ref 0–99)
Triglycerides: 121 mg/dL (ref 0–149)
VLDL Cholesterol Cal: 22 mg/dL (ref 5–40)

## 2024-06-07 LAB — BAYER DCA HB A1C WAIVED: HB A1C (BAYER DCA - WAIVED): 7.9 % — ABNORMAL HIGH (ref 4.8–5.6)

## 2024-06-07 MED ORDER — DAPAGLIFLOZIN PROPANEDIOL 10 MG PO TABS: 10.0000 mg | ORAL_TABLET | Freq: Every day | ORAL | 1 refills | Status: DC

## 2024-06-07 NOTE — Telephone Encounter (Signed)
 FARXIGA  10 MG TABS tablet        Changed from: dapagliflozin  propanediol (FARXIGA ) 10 MG TABS tablet   Sig: TAKE 1 TABLET (5 MG TOTAL) BY MOUTH DAILY BEFORE BREAKFAST.   Pharmacy comment: Script Clarification:IS THIS FOR 10MG  DAILY OR 5MG  DAILY- DIRECTIONS ARE CONFUSING.

## 2024-06-07 NOTE — Progress Notes (Signed)
 Subjective:  Patient ID: Anthony Austin, male    DOB: [birth year]  Age: 63 y.o. MRN: [id number]  CC: Diabetes ([facility] does not have farxiga  in stock ever. Pt has not taken it yet. ) and upset stomach (Stomach stays tore up almost all the time. Food causes upset stomach and goes through him fast. Peanut oil and dairy especially. )   HPI  Discussed the use of AI scribe software for clinical note transcription with the patient, who gave verbal consent to proceed.  History of Present Illness Anthony Austin is a 63 year old male with diabetes and hypertension who presents for follow-up of his diabetes management and neuropathy symptoms.  He has ongoing issues with his left leg, describing it as his 'weak side' with no balance and lack of strength in his ankles. He attends physical therapy once a week to address these issues. He experiences variability in symptoms, with some days feeling 'pretty good' and others where the leg feels numb and weak.  He experiences a burning sensation in his toes, which is managed with gabapentin . Without gabapentin , the pain would be unbearable. Despite the medication, numbness persists, which is not alleviated by any medication.  His blood pressure was recently elevated at 148/unknown. His current management includes lisinopril  and a diuretic.  His diabetes management has been challenging, with an A1c of 7.9, slightly higher than previous readings. He has been unable to fill his prescription for Farxiga  due to stock issues at the pharmacy. He previously used injections for diabetes management but stopped due to adverse effects on his blood pressure.  He reports gastrointestinal issues, particularly after consuming dairy and peanut products, leading to stomach upset and discomfort. He suspects peanut oil and lactose intolerance as potential causes and has been trying to avoid these foods.          06/07/2023   10:31 AM 05/05/2023    9:27 AM 02/01/2023    8:23 AM   Depression screen PHQ 2/9  Decreased Interest 0 0 0  Down, Depressed, Hopeless 0 0 0  PHQ - 2 Score 0 0 0    History Anthony Austin has a past medical history of Acute appendicitis with localized peritonitis, without perforation, abscess, or gangrene (02/08/2024), Diabetes mellitus without complication (HCC), Heat exhaustion, Hyperlipidemia, Hypertension, Prostate CA (HCC) (2005), and S/P laparoscopic appendectomy (02/08/2024).   He has a past surgical history that includes Prostate surgery; Prostatectomy (2005); Prostatectomy; Colonoscopy (07/31/2010); Back surgery; and Xi robotic laparoscopic assisted appendectomy (N/A, 02/08/2024).   His family history includes Cancer in his father; Diabetes in his mother; Hypertension in his father and mother; Stroke in his mother.He reports that he has never smoked. He has never used smokeless tobacco. He reports that he does not drink alcohol and does not use drugs.    ROS Review of Systems  Constitutional: Negative.   HENT: Negative.    Eyes:  Negative for visual disturbance.  Respiratory:  Negative for cough and shortness of breath.   Cardiovascular:  Negative for chest pain and leg swelling.  Gastrointestinal:  Positive for abdominal pain (heartburn, see HPI). Negative for diarrhea, nausea and vomiting.  Genitourinary:  Negative for difficulty urinating.  Musculoskeletal:  Negative for arthralgias and myalgias.  Skin:  Negative for rash.  Neurological:  Negative for headaches.  Psychiatric/Behavioral:  Negative for sleep disturbance.     Objective:  BP (!) 148/83   Pulse 60   Temp 97.8 F (36.6 C)   Ht 5' 7 (1.702 m)  Wt 199 lb (90.3 kg)   SpO2 98%   BMI 31.17 kg/m   BP Readings from Last 3 Encounters:  06/07/24 (!) 148/83  03/08/24 (!) 153/87  02/09/24 115/72    Wt Readings from Last 3 Encounters:  06/07/24 199 lb (90.3 kg)  03/08/24 190 lb (86.2 kg)  02/08/24 195 lb 5.2 oz (88.6 kg)     Physical Exam Physical Exam VITALS:  BP- 148/ GENERAL: Alert, cooperative, well developed, no acute distress HEENT: Normocephalic, normal oropharynx, moist mucous membranes CHEST: Clear to auscultation bilaterally, No wheezes, rhonchi, or crackles CARDIOVASCULAR: Normal heart rate and rhythm, S1 and S2 normal without murmurs ABDOMEN: Soft, non-tender, non-distended, without organomegaly, Normal bowel sounds EXTREMITIES: No cyanosis or edema NEUROLOGICAL: Cranial nerves grossly intact, Moves all extremities without gross motor or sensory deficit   Assessment & Plan:  Diabetic lipidosis (HCC) -     Bayer DCA Hb A1c Waived -     Comprehensive metabolic panel with GFR -     Lipid panel  Hyperlipidemia, unspecified hyperlipidemia type -     Comprehensive metabolic panel with GFR -     Lipid panel  Hypertension associated with type 2 diabetes mellitus (HCC) -     Bayer DCA Hb A1c Waived -     Comprehensive metabolic panel with GFR  Paresthesia of both feet    Assessment and Plan Assessment & Plan Type 2 diabetes mellitus with neuropathy   A1c increased to 7.9. Neuropathy symptoms include a burning sensation in toes, managed with gabapentin , though numbness persists. Farxiga  prescription issues due to stock availability were discussed, along with its benefits for heart and kidney protection. Emphasized medication adherence and lifestyle modifications. Increased Farxiga  to 10 mg daily and continued gabapentin . Encouraged regular exercise and dietary changes.  Hypertension   Blood pressure recorded at 148/unknown, slightly elevated. Current medications include lisinopril  and a diuretic. Emphasized the importance of home monitoring and maintaining a target of 130/80. Advised monitoring blood pressure at home 2-3 times a week and continuing current medications.  Lactose intolerance   Symptoms include stomach upset and diarrhea after dairy consumption. Discussed age-related decrease in lactase production and potential use of  lactase supplements. Suggested a trial of lactase supplements with dairy.  Possible peanut allergy   Stomach upset after consuming peanut products suggests a possible peanut allergy. Discussed elimination diet or allergist consultation for further evaluation. Recommended considering an elimination diet and consulting an allergist if symptoms persist.  General Health Maintenance   Discussed recent colonoscopy and eye exam, emphasizing the importance of regular screenings and physical exams. Will schedule a complete physical exam at the next visit and continue regular screenings as needed.       Follow-up: Return in about 3 months (around 09/05/2024) for hypertension, diabetes, Compete physical.  Butler Der, M.D.

## 2024-06-08 ENCOUNTER — Ambulatory Visit: Admitting: Physical Therapy

## 2024-06-08 ENCOUNTER — Encounter: Payer: Self-pay | Admitting: Physical Therapy

## 2024-06-08 DIAGNOSIS — R29818 Other symptoms and signs involving the nervous system: Secondary | ICD-10-CM | POA: Diagnosis not present

## 2024-06-08 DIAGNOSIS — M5459 Other low back pain: Secondary | ICD-10-CM

## 2024-06-08 DIAGNOSIS — R2689 Other abnormalities of gait and mobility: Secondary | ICD-10-CM

## 2024-06-08 DIAGNOSIS — M6281 Muscle weakness (generalized): Secondary | ICD-10-CM | POA: Diagnosis not present

## 2024-06-08 DIAGNOSIS — M25552 Pain in left hip: Secondary | ICD-10-CM

## 2024-06-08 NOTE — Therapy (Signed)
 OUTPATIENT PHYSICAL THERAPY TREATMENT   Patient Name: Anthony Austin MRN: [id number] DOB:12-17-1960, 63 y.o., male Today's Date: 06/08/2024  END OF SESSION:  PT End of Session - 06/08/24 0757     Visit Number 11    Number of Visits 15    Date for Recertification  06/15/24    Authorization Type BCBS    PT Start Time 0800    PT Stop Time 0840    PT Time Calculation (min) 40 min    Activity Tolerance Patient tolerated treatment well            Past Medical History:  Diagnosis Date   Acute appendicitis with localized peritonitis, without perforation, abscess, or gangrene 02/08/2024   Diabetes mellitus without complication (HCC)    Heat exhaustion    Hyperlipidemia    Hypertension    Prostate CA (HCC) 2005   S/P laparoscopic appendectomy 02/08/2024   Past Surgical History:  Procedure Laterality Date   BACK SURGERY     COLONOSCOPY  07/31/2010   Dr. Debrah   PROSTATE SURGERY     PROSTATECTOMY  2005   PROSTATECTOMY     XI ROBOTIC LAPAROSCOPIC ASSISTED APPENDECTOMY N/A 02/08/2024   Procedure: APPENDECTOMY, ROBOT-ASSISTED, LAPAROSCOPIC;  Surgeon: Mavis Anes, MD;  Location: [facility];  Service: General;  Laterality: N/A;   Patient Active Problem List   Diagnosis Date Noted   Diabetic lipidosis (HCC) 03/14/2024   Lumbar radiculopathy, acute 11/16/2023   Hyperlipidemia 08/11/2023   Diabetes mellitus treated with injections of non-insulin medication (HCC) 03/16/2023   Fatty liver 03/16/2023   BMI 29.0-29.9,adult 02/13/2019   Vitamin D  deficiency 02/13/2019   Paresthesia of both feet 02/13/2019   Claudication 09/02/2016   Essential hypertension 09/20/2015    PCP: Zollie Lowers, MD  REFERRING PROVIDER: Mavis Purchase, MD  REFERRING DIAG: M51.16 (ICD-10-CM) - Intervertebral disc disorders with radiculopathy, lumbar region  Rationale for Evaluation and Treatment: Rehabilitation  THERAPY DIAG:  Other low back pain  Pain in left hip  Other abnormalities of gait  and mobility  Muscle weakness (generalized)  Other symptoms and signs involving the nervous system  ONSET DATE: December 23, 2023  SUBJECTIVE:                                                                                                                                                                                           SUBJECTIVE STATEMENT: No falls. Has been helping to clear out a big field.   PERTINENT HISTORY:  12/23/23 L L5-S1 lateral diskectomy and then appendix surgery within the past few months HTN, diabetes   PAIN:  Are you having pain? Yes:  NPRS scale: 2-3/10 Pain location: L low back and hip Pain description: numbness, burning Aggravating factors: walking, sitting Relieving factors: laying on back or pain medication  PRECAUTIONS: None  RED FLAGS: None   WEIGHT BEARING RESTRICTIONS: No  FALLS:  Has patient fallen in last 6 months? 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  DIAGNOSTIC FINDINGS:  MRI 11/14/23: IMPRESSION: 1. Likely subacute left foraminal disc protrusion at L5-S1 causing prominent impingement on the left L5 nerve roots. 2. Moderate right eccentric impingement at L4-5 due to spondylosis and degenerative disc disease. 3. Scattered descending and sigmoid colon diverticula.  PATIENT SURVEYS:  Modified Oswestry:  MODIFIED OSWESTRY DISABILITY SCALE  Date: 03/13/24 Score  Pain intensity 3 =  Pain medication provides me with moderate relief from pain.  2. Personal care (washing, dressing, etc.) 0 =  I  can take care of myself normally without causing increased pain.  3. Lifting 3 = Pain prevents me from lifting heavy weights, but I can manage (5) I have hardly any social life because of my pain. light to medium weights if they are conveniently positioned  4. Walking 1 = Pain prevents me from walking more than 1 mile.  5. Sitting 1 =  I can only sit in my favorite chair as long as I like.  6. Standing 1 =  I can stand as long as I want but, it increases my pain.  7. Sleeping 0 = Pain does not prevent me from sleeping well.  8. Social Life 0 = My social life is normal and does not increase my pain.  9. Traveling 1 =  I can travel anywhere, but it increases my pain.  10. Employment/ Homemaking 1 = My normal homemaking/job activities increase my pain, but I can still perform all that is required of me  Total 11/50   Interpretation of scores: Score Category Description  0-20% Minimal Disability The patient can cope with most living activities. Usually no treatment is indicated apart from advice on lifting, sitting and exercise  21-40% Moderate Disability The patient experiences more pain and difficulty with sitting, lifting and standing. Travel and social life are more difficult and they may be disabled from work. Personal care, sexual activity and sleeping are not grossly affected, and the patient can usually be managed by conservative means  41-60% Severe Disability Pain remains the main problem in this group, but activities of daily living are affected. These patients require a detailed investigation  61-80% Crippled Back pain impinges on all aspects of the patients life. Positive intervention is required  81-100% Bed-bound  These patients are either bed-bound or exaggerating their symptoms  Bluford FORBES Zoe DELENA Karon DELENA, et al. Surgery versus conservative management of stable thoracolumbar fracture: the PRESTO feasibility RCT. [place] ([place]): [employer]; 2021 Nov. [facility] Technology  Assessment, No. 25.62.) Appendix 3, Oswestry Disability Index category descriptors. Available from: Findjewelers.cz  Minimally Clinically Important Difference (MCID) = 12.8%   SENSATION: A little numbness on top of L foot  MUSCLE LENGTH: Hamstrings: Right 60 deg; Left 45 deg Thomas test: WNL  PALPATION: TTP  L hip flexor, L glute med and QL  LUMBAR ROM: Did not assess  LOWER EXTREMITY ROM:   L LAQ lacks ~10 deg of full extension (increases pain)   LOWER EXTREMITY MMT:    MMT Right eval Left eval Left 12/18  Hip flexion 5 5 5   Hip extension  5 5  Hip abduction  3+ 4+  Hip adduction   5  Hip internal rotation     Hip external rotation     Knee flexion 5 4   Knee extension 5 5   Ankle dorsiflexion 5 3+ 5  Ankle plantarflexion 5 3+ 5  Ankle inversion 5 3+ 5  Ankle eversion 5 3+ 5   (Blank rows = not tested)  LUMBAR SPECIAL TESTS:  Straight leg raise test: Positive, Slump test: Positive, and FABER test: Negative  FUNCTIONAL TESTS:  Eval:  SLS: R LE 9.63 sec, L LE 3.69 sec  05/04/24 SLS: R LE 31.63, L LE 6.5 sec  GAIT: Distance walked: Into clinic Assistive device utilized: None Level of assistance: Complete Independence Comments: slightly guarded with L stance phase   TREATMENT DATE:  06/08/24                                 EXERCISE LOG  Exercise Repetitions and Resistance Comments  Recumbent bike L3 x 10 min   Front plank 2x30 Feet/forearms  Side plank with clamshell 2x30 R&L; forearm/knees  Bird dog with red TB 2x10   Quadruped hip hike into fire hydrant red TB 2x10   SLS X3 trials each leg R SLS: 18.91 sec, 11.42 sec, 16.59 sec L SLS: 5.24 sec, <3 sec  Rechecked MMT                                 Blank cell = exercise not performed today    06/01/24                                 EXERCISE LOG  Exercise Repetitions and Resistance Comments  Recumbent bike L3 x 10 min   Front plank 2x30 Feet/forearms   Side plank 2x30 R&L; forearm/knees  Bird dog  2x10   Quadruped hip hike into fire hydrant 2x10   Standing on green theraband foam hip hike with glute med setting 2x10   Standing on blue side of bosu ankle inv/ev/df/pf 2x10each                                Blank cell = exercise not performed today    05/25/24                                 EXERCISE LOG  Exercise Repetitions and Resistance Comments  Recumbent bike L3 x 10 min   SLS foot on 2 box, step tap fwd and backward 2x10   Lunge on 2 box into internal rotation 2x10   Lunge on 2 box with diagonals 4# 2x10   Lunge with leg pressing out into ball on [place] 2x10   Captain morgans leg pressing out into ball on Diana 2x10   Standing on 2 box hip hike with glute med setting 2X10  each                Blank cell = exercise not performed today    05/04/24                                 EXERCISE LOG  Exercise Repetitions and Resistance Comments  Recumbent bike L3 x 8 min   Single leg eccentric heel raise 2x10   R foot on dynadisc, squats 2x10   L foot on dynadisc, side step tap 4 box 2x10   L foot on dynadisc, fwd step tap 4 box 2x10   L foot on dynadisc, fwd backward tap 4 box 2x10   Standing on incline eyes closed head nods, head turns X10 each   Standing on decline eyes closed Head nods, head turns X10 each   Standing on blue side of bosu: ankle inv, ev, DF, PF 2x10 each        Blank cell = exercise not performed today       PATIENT EDUCATION:  Education details: HEP updates Person educated: Patient Education method: Explanation, Demonstration, and Handouts Education comprehension: verbalized understanding, returned demonstration, and needs further education  HOME EXERCISE PROGRAM: Access Code: [email or web address] URL: [email or web address] Date: 03/24/2024 Prepared by: Alyah Boehning April Earnie Starring  Exercises - CLX Ankle Dorsiflexion and Eversion  - 1 x daily - 7 x weekly - 3 sets - 10 reps -  Seated Ankle Inversion with Resistance and Legs Crossed  - 1 x daily - 7 x weekly - 3 sets - 10 reps - Seated Ankle Eversion with Resistance  - 1 x daily - 7 x weekly - 3 sets - 10 reps - Modified Thomas Stretch  - 1 x daily - 7 x weekly - 2 sets - 30 sec hold - Supine Piriformis Stretch with Foot on Ground  - 1 x daily - 7 x weekly - 2 sets - 30 sec hold - Seated Piriformis Stretch  - 1 x daily - 7 x weekly - 2 sets - 30 sec hold - Clam with Resistance  - 1 x daily - 7 x weekly - 2 sets - 10 reps - Sidelying Reverse Clamshell with Resistance  - 1 x daily - 7 x weekly - 2 sets - 10 reps - Side Plank on Knees  - 1 x daily - 7 x weekly - 3 sets - 10 sec hold  ASSESSMENT:  CLINICAL IMPRESSION: Continued to progress core and hip strength. MMT demos increased overall strength; however, pt remains highly challenged with SLS and balancing on L LE. Stable in tandem stance.   OBJECTIVE IMPAIRMENTS: Abnormal gait, decreased activity tolerance, decreased balance, decreased endurance, decreased mobility, difficulty walking, decreased ROM, decreased strength, hypomobility, increased fascial restrictions, increased muscle spasms, impaired flexibility, improper body mechanics, postural dysfunction, and pain.   ACTIVITY LIMITATIONS: sitting, standing, squatting, stairs, transfers, and locomotion level  PARTICIPATION LIMITATIONS: driving, community activity, occupation, and yard work  PERSONAL FACTORS: Age, Fitness, Past/current experiences, and Time since onset of injury/illness/exacerbation are also affecting patient's functional outcome.   REHAB POTENTIAL: Good  CLINICAL DECISION MAKING: Evolving/moderate complexity  EVALUATION COMPLEXITY: Moderate   GOALS: Goals reviewed with patient? Yes  SHORT TERM GOALS: Target date: 04/10/2024   Pt will be ind with initial HEP Baseline: Goal status: MET  2.  Pt will be able to perform full LAQ without pain to demo improving neural tension Baseline:   11/13:  It feels like the left is looser than the right Goal status: MET  3.  Pt will report decrease in symptoms by >/=25% Baseline:  Goal status: IN PROGRESS   LONG TERM GOALS: Target date: 06/15/2024   Pt will be ind with management and progression of HEP Baseline:  Goal status: IN PROGRESS  2.  Pt will be able to perform SLS for 10 sec bilaterally to demo increasing LE strength/stability Baseline:  11/13: 6.5 sec on L, >30 sec on R Goal status: IN PROGRESS  3.  Pt will be able to perform L single leg heel raise to demo increasing ankle strength  Baseline:  11/13: Able to perform 2x10 but not as high as R Goal status: MET  4.  Pt will report improved modified Oswestry score to </=5 to demo MCID Baseline: 11 Goal status: IN PROGRESS  5.  Pt will report improved symptoms by >/=50% Baseline:  Goal status: IN PROGRESS   PLAN:  PT FREQUENCY: 1-2x/week  PT DURATION: 8 weeks  PLANNED INTERVENTIONS: [id number]- PT Re-evaluation, 97750- Physical Performance Testing, 97110-Therapeutic exercises, 97530- Therapeutic activity, [id number]- Neuromuscular re-education, 97535- Self Care, [id number]- Manual therapy, [phone]- Gait training, Patient/Family education, Balance training, Stair training, Cryotherapy, and Moist heat.  PLAN FOR NEXT SESSION: Assess response to HEP. Stretch hips. Incorporate neural glides. Strengthen ankle and L LE.    Lucinda Spells April Ma L Desirie Minteer, PT, DPT 06/08/2024, 7:57 AM

## 2024-06-29 ENCOUNTER — Ambulatory Visit: Payer: Self-pay | Attending: Neurosurgery | Admitting: Physical Therapy

## 2024-09-11 ENCOUNTER — Encounter: Admitting: Family Medicine
